# Patient Record
Sex: Male | Born: 1940
Health system: Southern US, Community
[De-identification: ages and names within clinical notes are randomized; demographics above are authoritative.]

## PROBLEM LIST (undated history)

## (undated) ENCOUNTER — Ambulatory Visit (HOSPITAL_COMMUNITY): Discharge status: Absent without leave | Payer: Medicare HMO

## (undated) DIAGNOSIS — M1990 Unspecified osteoarthritis, unspecified site: Secondary | ICD-10-CM

## (undated) DIAGNOSIS — F32A Depression, unspecified: Secondary | ICD-10-CM

## (undated) DIAGNOSIS — I2510 Atherosclerotic heart disease of native coronary artery without angina pectoris: Secondary | ICD-10-CM

## (undated) DIAGNOSIS — K429 Umbilical hernia without obstruction or gangrene: Secondary | ICD-10-CM

## (undated) DIAGNOSIS — R911 Solitary pulmonary nodule: Secondary | ICD-10-CM

## (undated) DIAGNOSIS — M25471 Effusion, right ankle: Secondary | ICD-10-CM

## (undated) DIAGNOSIS — L719 Rosacea, unspecified: Secondary | ICD-10-CM

## (undated) DIAGNOSIS — U071 COVID-19: Secondary | ICD-10-CM

## (undated) DIAGNOSIS — H269 Unspecified cataract: Secondary | ICD-10-CM

## (undated) DIAGNOSIS — K648 Other hemorrhoids: Secondary | ICD-10-CM

## (undated) DIAGNOSIS — E785 Hyperlipidemia, unspecified: Secondary | ICD-10-CM

## (undated) DIAGNOSIS — I10 Essential (primary) hypertension: Secondary | ICD-10-CM

## (undated) DIAGNOSIS — H9190 Unspecified hearing loss, unspecified ear: Secondary | ICD-10-CM

## (undated) DIAGNOSIS — J449 Chronic obstructive pulmonary disease, unspecified: Secondary | ICD-10-CM

## (undated) DIAGNOSIS — K222 Esophageal obstruction: Secondary | ICD-10-CM

## (undated) DIAGNOSIS — D126 Benign neoplasm of colon, unspecified: Secondary | ICD-10-CM

## (undated) DIAGNOSIS — F329 Major depressive disorder, single episode, unspecified: Secondary | ICD-10-CM

## (undated) DIAGNOSIS — K644 Residual hemorrhoidal skin tags: Secondary | ICD-10-CM

## (undated) DIAGNOSIS — K5790 Diverticulosis of intestine, part unspecified, without perforation or abscess without bleeding: Secondary | ICD-10-CM

## (undated) DIAGNOSIS — K449 Diaphragmatic hernia without obstruction or gangrene: Secondary | ICD-10-CM

## (undated) DIAGNOSIS — I739 Peripheral vascular disease, unspecified: Secondary | ICD-10-CM

## (undated) DIAGNOSIS — C801 Malignant (primary) neoplasm, unspecified: Secondary | ICD-10-CM

## (undated) HISTORY — DX: Diverticulosis of intestine, part unspecified, without perforation or abscess without bleeding: K57.90

## (undated) HISTORY — DX: Diaphragmatic hernia without obstruction or gangrene: K44.9

## (undated) HISTORY — DX: Solitary pulmonary nodule: R91.1

## (undated) HISTORY — PX: TONSILLECTOMY: SUR1361

## (undated) HISTORY — DX: Unspecified hearing loss, unspecified ear: H91.90

## (undated) HISTORY — PX: CATARACT EXTRACTION, BILATERAL: SHX1313

## (undated) HISTORY — DX: Atherosclerotic heart disease of native coronary artery without angina pectoris: I25.10

## (undated) HISTORY — DX: Chronic obstructive pulmonary disease, unspecified: J44.9

## (undated) HISTORY — DX: Other hemorrhoids: K64.8

## (undated) HISTORY — DX: Depression, unspecified: F32.A

## (undated) HISTORY — DX: Esophageal obstruction: K22.2

## (undated) HISTORY — DX: Major depressive disorder, single episode, unspecified: F32.9

## (undated) HISTORY — DX: Rosacea, unspecified: L71.9

## (undated) HISTORY — DX: Benign neoplasm of colon, unspecified: D12.6

## (undated) HISTORY — DX: Residual hemorrhoidal skin tags: K64.4

## (undated) HISTORY — DX: Essential (primary) hypertension: I10

## (undated) HISTORY — DX: Hyperlipidemia, unspecified: E78.5

## (undated) HISTORY — DX: Effusion, right ankle: M25.471

## (undated) HISTORY — DX: Umbilical hernia without obstruction or gangrene: K42.9

## (undated) HISTORY — DX: Unspecified osteoarthritis, unspecified site: M19.90

## (undated) HISTORY — DX: Unspecified cataract: H26.9

## (undated) HISTORY — PX: OTHER SURGICAL HISTORY: SHX169

---
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
## 2002-11-20 ENCOUNTER — Emergency Department (HOSPITAL_COMMUNITY): Admission: EM | Admit: 2002-11-20 | Discharge: 2002-11-20 | Payer: Self-pay | Admitting: Emergency Medicine

## 2004-01-19 ENCOUNTER — Encounter: Payer: Self-pay | Admitting: Internal Medicine

## 2007-01-16 ENCOUNTER — Ambulatory Visit: Payer: Self-pay | Admitting: Internal Medicine

## 2007-01-30 ENCOUNTER — Ambulatory Visit: Payer: Self-pay | Admitting: Internal Medicine

## 2007-01-30 HISTORY — PX: COLONOSCOPY W/ BIOPSIES AND POLYPECTOMY: SHX1376

## 2007-10-23 ENCOUNTER — Encounter: Admission: RE | Admit: 2007-10-23 | Discharge: 2007-10-23 | Payer: Self-pay | Admitting: Internal Medicine

## 2009-01-08 ENCOUNTER — Encounter: Payer: Self-pay | Admitting: Internal Medicine

## 2009-01-15 ENCOUNTER — Encounter (INDEPENDENT_AMBULATORY_CARE_PROVIDER_SITE_OTHER): Payer: Self-pay | Admitting: *Deleted

## 2009-02-18 ENCOUNTER — Ambulatory Visit: Payer: Self-pay | Admitting: Internal Medicine

## 2009-02-18 ENCOUNTER — Encounter (INDEPENDENT_AMBULATORY_CARE_PROVIDER_SITE_OTHER): Payer: Self-pay | Admitting: *Deleted

## 2009-02-18 DIAGNOSIS — R1319 Other dysphagia: Secondary | ICD-10-CM | POA: Insufficient documentation

## 2009-02-19 ENCOUNTER — Ambulatory Visit: Payer: Self-pay | Admitting: Internal Medicine

## 2009-02-19 HISTORY — PX: UPPER GASTROINTESTINAL ENDOSCOPY: SHX188

## 2010-01-21 ENCOUNTER — Telehealth (INDEPENDENT_AMBULATORY_CARE_PROVIDER_SITE_OTHER): Payer: Self-pay | Admitting: *Deleted

## 2010-03-19 ENCOUNTER — Ambulatory Visit: Payer: Self-pay | Admitting: Cardiology

## 2010-03-29 ENCOUNTER — Telehealth (INDEPENDENT_AMBULATORY_CARE_PROVIDER_SITE_OTHER): Payer: Self-pay

## 2010-03-30 ENCOUNTER — Encounter: Payer: Self-pay | Admitting: Cardiovascular Disease

## 2010-03-30 ENCOUNTER — Ambulatory Visit: Admission: RE | Admit: 2010-03-30 | Discharge: 2010-03-30 | Payer: Self-pay | Source: Home / Self Care

## 2010-03-30 ENCOUNTER — Encounter (HOSPITAL_COMMUNITY)
Admission: RE | Admit: 2010-03-30 | Discharge: 2010-04-20 | Payer: Self-pay | Source: Home / Self Care | Attending: Cardiology | Admitting: Cardiology

## 2010-04-20 NOTE — Progress Notes (Signed)
  Phone Note Other Incoming   Request: Send information Summary of Call: Request for records received from [facility]. [place] faxed 02/19/09 upper endo, 02/18/09 ov, 01/30/07 colon and 01/19/04 colon to fax number [phone].

## 2010-04-22 NOTE — Progress Notes (Signed)
Summary: Nuc.Pre-Procedure  Phone Note Outgoing Call Call back at [facility] Phone [phone]   Call placed by: Irean Hong, RN,  March 29, 2010 1:50 PM Summary of Call: Reviewed information on Myoview Information Sheet (see scanned document for further details).  Spoke with patient.     Nuclear Med Background Indications for Stress Test: Evaluation for Ischemia     Symptoms: Chest Pain, Palpitations    Nuclear Pre-Procedure Cardiac Risk Factors: History of Smoking, Hypertension, Lipids Height (in): 72

## 2010-04-22 NOTE — Assessment & Plan Note (Signed)
Summary: Cardiology Nuclear Testing  Nuclear Med Background Indications for Stress Test: Evaluation for Ischemia     Symptoms: Chest Pain, Fatigue, Palpitations, Rapid HR  Symptoms Comments: Chest discomfort/soreness   Nuclear Pre-Procedure Cardiac Risk Factors: Carotid Disease, History of Smoking, Hypertension, Lipids Caffeine/Decaff Intake: None NPO After: 8:00 PM Lungs: clear IV 0.9% NS with Angio Cath: 20g     IV Site: R Antecubital IV Started by: Irean Hong, RN Chest Size (in) 40     Height (in): 72 Weight (lb): 197 BMI: 26.81 Tech Comments: Held metoprolol 24 hrs.  Nuclear Med Study 1 or 2 day study:  1 day     Stress Test Type:  Stress Reading MD:  Kristeen Miss, MD     Referring MD:  Villa Herb Resting Radionuclide:  Technetium 37m Tetrofosmin     Resting Radionuclide Dose:  11 mCi  Stress Radionuclide:  Technetium 51m Tetrofosmin     Stress Radionuclide Dose:  33 mCi   Stress Protocol Exercise Time (min):  11:36 min     Max HR:  133 bpm     Predicted Max HR:  149 bpm  Max Systolic BP: 178 mm Hg     Percent Max HR:  89.26 %     METS: 13.7 Rate Pressure Product:  16109    Stress Test Technologist:  Cathlyn Parsons, RN     Nuclear Technologist:  Doyne Keel, CNMT  Rest Procedure  Myocardial perfusion imaging was performed at rest 45 minutes following the intravenous administration of Technetium 32m Tetrofosmin.  Stress Procedure  The patient exercised for 11:36. The patient stopped due to fatigue and moderate SOB. Patient RPE level at peak exercise of 17. Patient denied any chest pain.  There were nonspecific ST-T wave changes.  Technetium 38m Tetrofosmin was injected at peak exercise and myocardial perfusion imaging was performed after a brief delay.  QPS Raw Data Images:  There is interference from nuclear activity from structures below the diaphragm.  This does not affect the ability to read the study. Stress Images:  There is a small defect in  the inferior basal segment with normal uptake in the remaining segments. Rest Images:  There is a small defect in the inferior basal segment with normal uptake in the remaining segments. Subtraction (SDS):  No evidence of ischemia. Transient Ischemic Dilatation:  .89  (Normal <1.22)  Lung/Heart Ratio:  .28  (Normal <0.45)  Quantitative Gated Spect Images QGS EDV:  107 ml QGS ESV:  45 ml QGS EF:  57 % QGS cine images:  The overall LV function is normal.  There is mild hypokinesis of the inferior basal segment.  Findings Low risk nuclear study      Overall Impression  Exercise Capacity: Excellent exercise capacity. BP Response: Normal blood pressure response. Clinical Symptoms: No chest pain ECG Impression: No significant ST segment change suggestive of ischemia. Overall Impression: Low risk stress nuclear study. Overall Impression Comments: There is a small defect in the inferior basal segment that may represent a past Inferior basal MI.  It may be due to bowel uptake of tracer.  There is no evidence of ischemia.  His exercise capacity is excellent.  Appended Document: Cardiology Nuclear Testing copy sent to Dr. Patty Sermons

## 2011-03-04 ENCOUNTER — Encounter: Payer: Self-pay | Admitting: Cardiology

## 2011-03-04 ENCOUNTER — Encounter: Payer: Self-pay | Admitting: *Deleted

## 2011-03-07 ENCOUNTER — Ambulatory Visit (INDEPENDENT_AMBULATORY_CARE_PROVIDER_SITE_OTHER): Payer: Medicare Other | Admitting: Cardiology

## 2011-03-07 ENCOUNTER — Encounter: Payer: Self-pay | Admitting: Cardiology

## 2011-03-07 DIAGNOSIS — E785 Hyperlipidemia, unspecified: Secondary | ICD-10-CM

## 2011-03-07 DIAGNOSIS — I10 Essential (primary) hypertension: Secondary | ICD-10-CM

## 2011-03-07 DIAGNOSIS — R079 Chest pain, unspecified: Secondary | ICD-10-CM | POA: Insufficient documentation

## 2011-03-07 LAB — BASIC METABOLIC PANEL
BUN: 20 mg/dL (ref 6–23)
CO2: 21 mEq/L (ref 19–32)
Calcium: 9.1 mg/dL (ref 8.4–10.5)
Chloride: 108 mEq/L (ref 96–112)
Creatinine, Ser: 1.1 mg/dL (ref 0.4–1.5)
Glucose, Bld: 107 mg/dL — ABNORMAL HIGH (ref 70–99)

## 2011-03-07 NOTE — Assessment & Plan Note (Signed)
Continue present blood pressure medications. 

## 2011-03-07 NOTE — Progress Notes (Signed)
HPI: 70 year old male for evaluation of chest pain. Patient seen by Dr Patty Sermons 12/11 with chest pain. Patient had carotid Dopplers in November of 2011. This showed 1-50% bilateral stenosis. Abdominal ultrasound in November of 2011 showed mild dilatation of the distal abdominal aorta but not considered a true aneurysm. Myoview in January of 2012 showed an ejection fraction of 57%. There was a small fixed defect in the inferior basal wall but no ischemia. Small prior infarct could not be excluded. He has been treated medically. Patient states he developed an "uneasy discomfort" in his chest last fall associated with anxiety and sitting for extended amount of time. The pain does not radiate. It is not paretic or positional. No associated symptoms. It lasts one hour and resolves spontaneously. He was started on an antidepressant with improvement. Several months ago he discontinued that medication and his symptoms recurred. He now notes they increase with exercising. He denies dyspnea on exertion, orthopnea, PND or pedal edema. Because of this chest pain he returns for further evaluation.  Current Outpatient Prescriptions  Medication Sig Dispense Refill  . buPROPion (WELLBUTRIN SR) 150 MG 12 hr tablet Take 150 mg by mouth daily.        . Cholecalciferol (VITAMIN D3) 2000 UNITS capsule Take 2,000 Units by mouth daily.        . Coenzyme Q10 (COQ-10 PO) Take 1 tablet by mouth daily.        Marland Kitchen escitalopram (LEXAPRO) 10 MG tablet Take 10 mg by mouth daily.        . Fish Oil OIL Take 1 tablet by mouth daily.        Marland Kitchen GLUCOSAMINE CHONDROITIN COMPLX PO Take 1 tablet by mouth daily.        Marland Kitchen LORazepam (ATIVAN) 0.5 MG tablet Take 0.5 mg by mouth every 8 (eight) hours as needed.        Marland Kitchen losartan (COZAAR) 50 MG tablet Take 50 mg by mouth daily.        . Multiple Vitamin (MULTIVITAMIN) capsule Take 1 capsule by mouth daily.        . pravastatin (PRAVACHOL) 40 MG tablet Take 40 mg by mouth daily.        Marland Kitchen zolpidem  (AMBIEN) 10 MG tablet Take 10 mg by mouth at bedtime as needed.           Past Medical History  Diagnosis Date  . DYSPHAGIA   . Colon polyp   . Hemorrhoids   . Arthritis   . Chest discomfort   . Bone spur   . COPD (chronic obstructive pulmonary disease)   . HTN (hypertension)   . Decreased hearing   . Rosacea   . Hyperlipemia   . Depression     Past Surgical History  Procedure Date  . Tonsillectomy     History   Social History  . Marital Status: Married    Spouse Name: N/A    Number of Children: N/A  . Years of Education: N/A   Occupational History  . Not on file.   Social History Main Topics  . Smoking status: Former Games developer  . Smokeless tobacco: Not on file  . Alcohol Use: Not on file  . Drug Use: Not on file  . Sexually Active: Not on file   Other Topics Concern  . Not on file   Social History Narrative  . No narrative on file    ROS: no fevers or chills, productive cough, hemoptysis, dysphasia, odynophagia, melena, hematochezia,  dysuria, hematuria, rash, seizure activity, orthopnea, PND, pedal edema, claudication. Remaining systems are negative.  Physical Exam: Well-developed well-nourished, anxious in no acute distress.  Skin is warm and dry.  HEENT is normal.  Neck is supple. No thyromegaly.  Chest is clear to auscultation with normal expansion.  Cardiovascular exam is regular rate and rhythm.  Abdominal exam nontender or distended. No masses palpated. Extremities show no edema. neuro grossly intact  ECG normal sinus rhythm with no ST changes.

## 2011-03-07 NOTE — Assessment & Plan Note (Signed)
Continue statin. 

## 2011-03-07 NOTE — Patient Instructions (Addendum)
Your physician recommends that you schedule a follow-up appointment in: 4-6 WEEKS  Your physician has requested that you have cardiac CT. Cardiac computed tomography (CT) is a painless test that uses an x-ray machine to take clear, detailed pictures of your heart. For further information please visit [email or web address] Please follow instruction sheet as given.   Your physician recommends that you return for lab work in: TODAY    START ASPIRIN 81 MG ONCE DAILY WITH FOOD

## 2011-03-07 NOTE — Assessment & Plan Note (Signed)
Symptoms with both typical and atypical features. He recently describes some discomfort with exertion. However, he also states that his symptoms returned after dcing antidepressant. Patient appears anxious in the office today and wonders whether there is a component of anxiety. I discussed cardiac cath vs CT; he would like conservative approach if possible. Will arrange cardiac CT with low threshold for cath. Add ASA 81 mg daily.

## 2011-03-08 ENCOUNTER — Other Ambulatory Visit: Payer: Self-pay | Admitting: *Deleted

## 2011-03-08 NOTE — Progress Notes (Signed)
Addended by: Freddi Starr on: 03/08/2011 12:43 PM   Modules accepted: Orders

## 2011-03-10 ENCOUNTER — Encounter: Payer: Self-pay | Admitting: *Deleted

## 2011-03-18 NOTE — Progress Notes (Signed)
Addended by: Kem Parkinson on: 03/18/2011 03:29 PM   Modules accepted: Orders

## 2011-03-24 ENCOUNTER — Telehealth: Payer: Self-pay | Admitting: Cardiology

## 2011-03-24 NOTE — Telephone Encounter (Signed)
Spoke with pt, yesterday while bending over to get dressed he suddenly developed a sharpe pain that went through his chest and into his back. It lasted for 20 to 30 sec and then went away. He denies SOB or other symptoms before or after the episode. He then went to the gym and worked out and had no problems. Today he is sore through his chest. He states it felt like a muscle pull and if he tqakes tylenol it helps the soreness. He is scheduled for a cardiac ct 04-06-11. Explained to pt that it sounded non-cardiac but will make dr Jens Som aware.

## 2011-03-24 NOTE — Telephone Encounter (Signed)
Spoke with pt, aware of dr Ludwig Clarks feels is non-cardiac pain.

## 2011-03-24 NOTE — Telephone Encounter (Signed)
New Problem:     Patient called in wanting to speak with you about an episode, or a "flare-up" as he called it, he had yesterday.  He says that outside of a little soreness, he is fine today. Patient says please call back at your earliest conveniens.

## 2011-03-24 NOTE — Telephone Encounter (Signed)
CP sounds muscular Olga Millers

## 2011-03-25 ENCOUNTER — Telehealth: Payer: Self-pay | Admitting: Internal Medicine

## 2011-03-28 ENCOUNTER — Telehealth: Payer: Self-pay | Admitting: Internal Medicine

## 2011-03-28 NOTE — Telephone Encounter (Signed)
Patient is c/o non cardiac chest pain.  He is scheduled for a cardiac CT on 04/06/11.  He will keep this appt on 04/19/11 if they rule out cardiac cause of pain.  He c/o intermittent CP mostly with activity "fast walking".  He is asking if there is anything he can take, I have advised him to try Prilosec OTC.  Patient has a history of esophageal stricture and hiatal hernia.

## 2011-03-28 NOTE — Telephone Encounter (Signed)
See other phone note from 03/25/11

## 2011-03-28 NOTE — Telephone Encounter (Signed)
Left message for patient to call back  

## 2011-03-28 NOTE — Telephone Encounter (Signed)
No answer and no machine I will continue to try and reach the patient. 

## 2011-03-30 ENCOUNTER — Other Ambulatory Visit (HOSPITAL_COMMUNITY): Payer: Medicare Other

## 2011-04-01 ENCOUNTER — Inpatient Hospital Stay (HOSPITAL_COMMUNITY): Admission: RE | Admit: 2011-04-01 | Payer: Medicare Other | Source: Ambulatory Visit

## 2011-04-01 ENCOUNTER — Other Ambulatory Visit (HOSPITAL_COMMUNITY): Payer: Medicare Other

## 2011-04-04 ENCOUNTER — Encounter: Payer: Self-pay | Admitting: Cardiology

## 2011-04-06 ENCOUNTER — Ambulatory Visit (HOSPITAL_COMMUNITY)
Admission: RE | Admit: 2011-04-06 | Discharge: 2011-04-06 | Disposition: A | Discharge status: Home / Self Care | Payer: Medicare Other | Source: Ambulatory Visit | Attending: Cardiology | Admitting: Cardiology

## 2011-04-06 DIAGNOSIS — J984 Other disorders of lung: Secondary | ICD-10-CM | POA: Insufficient documentation

## 2011-04-06 DIAGNOSIS — R072 Precordial pain: Secondary | ICD-10-CM

## 2011-04-06 MED ORDER — IOHEXOL 350 MG/ML SOLN
80.0000 mL | Freq: Once | INTRAVENOUS | Status: AC | PRN
  Administered 2011-04-06: 80 mL via INTRAVENOUS

## 2011-04-07 ENCOUNTER — Telehealth: Payer: Self-pay | Admitting: Cardiology

## 2011-04-07 MED FILL — Metoprolol Tartrate IV Soln 5 MG/5ML: INTRAVENOUS | Qty: 15 | Status: AC

## 2011-04-07 NOTE — Telephone Encounter (Signed)
New Problem  Patient calling for results of test taken yesterday.  Please return call on [phone]

## 2011-04-07 NOTE — Telephone Encounter (Signed)
Spoke with pt, aware of ct results. Follow up appt 04-18-11.

## 2011-04-11 ENCOUNTER — Other Ambulatory Visit: Payer: Medicare Other | Admitting: *Deleted

## 2011-04-18 ENCOUNTER — Encounter: Payer: Self-pay | Admitting: Cardiology

## 2011-04-18 ENCOUNTER — Other Ambulatory Visit: Payer: Self-pay | Admitting: Cardiology

## 2011-04-18 ENCOUNTER — Ambulatory Visit (INDEPENDENT_AMBULATORY_CARE_PROVIDER_SITE_OTHER): Payer: Medicare Other | Admitting: Cardiology

## 2011-04-18 ENCOUNTER — Encounter: Payer: Self-pay | Admitting: *Deleted

## 2011-04-18 DIAGNOSIS — R072 Precordial pain: Secondary | ICD-10-CM

## 2011-04-18 DIAGNOSIS — I10 Essential (primary) hypertension: Secondary | ICD-10-CM

## 2011-04-18 DIAGNOSIS — R079 Chest pain, unspecified: Secondary | ICD-10-CM

## 2011-04-18 DIAGNOSIS — Z01810 Encounter for preprocedural cardiovascular examination: Secondary | ICD-10-CM

## 2011-04-18 DIAGNOSIS — J984 Other disorders of lung: Secondary | ICD-10-CM

## 2011-04-18 DIAGNOSIS — E785 Hyperlipidemia, unspecified: Secondary | ICD-10-CM

## 2011-04-18 DIAGNOSIS — R911 Solitary pulmonary nodule: Secondary | ICD-10-CM

## 2011-04-18 DIAGNOSIS — I2510 Atherosclerotic heart disease of native coronary artery without angina pectoris: Secondary | ICD-10-CM

## 2011-04-18 LAB — CBC WITH DIFFERENTIAL/PLATELET
Basophils Absolute: 0 10*3/uL (ref 0.0–0.1)
HCT: 42.2 % (ref 39.0–52.0)
Lymphocytes Relative: 28.2 % (ref 12.0–46.0)
Lymphs Abs: 1.5 10*3/uL (ref 0.7–4.0)
Monocytes Relative: 11.4 % (ref 3.0–12.0)
Neutrophils Relative %: 57.8 % (ref 43.0–77.0)
Platelets: 237 10*3/uL (ref 150.0–400.0)
RDW: 13.4 % (ref 11.5–14.6)

## 2011-04-18 LAB — BASIC METABOLIC PANEL
BUN: 17 mg/dL (ref 6–23)
Calcium: 9.3 mg/dL (ref 8.4–10.5)
Creatinine, Ser: 1.1 mg/dL (ref 0.4–1.5)
GFR: 72.92 mL/min (ref >60.00)
Glucose, Bld: 92 mg/dL (ref 70–99)

## 2011-04-18 LAB — PROTIME-INR
INR: 1 ratio (ref 0.8–1.0)
Prothrombin Time: 10.8 s (ref 10.2–12.4)

## 2011-04-18 NOTE — Assessment & Plan Note (Signed)
Continue aspirin and statin. 

## 2011-04-18 NOTE — Assessment & Plan Note (Signed)
Blood pressure controlled. Continue present medications. 

## 2011-04-18 NOTE — Patient Instructions (Signed)
Your physician recommends that you schedule a follow-up appointment in: 2-3 WEEKS WITH PA OR NP  Your physician recommends that you return for lab work in: TODAY  Your physician has requested that you have a cardiac catheterization. Cardiac catheterization is used to diagnose and/or treat various heart conditions. Doctors may recommend this procedure for a number of different reasons. The most common reason is to evaluate chest pain. Chest pain can be a symptom of coronary artery disease (CAD), and cardiac catheterization can show whether plaque is narrowing or blocking your heart's arteries. This procedure is also used to evaluate the valves, as well as measure the blood flow and oxygen levels in different parts of your heart. For further information please visit [email or web address] Please follow instruction sheet, as given.

## 2011-04-18 NOTE — Progress Notes (Signed)
 HPI:70-year-old male for FU of CAD. Patient seen by Dr Brackbill 12/11 with chest pain. Patient had carotid Dopplers in November of 2011. This showed 1-50% bilateral stenosis. Abdominal ultrasound in November of 2011 showed mild dilatation of the distal abdominal aorta but not considered a true aneurysm. Myoview in January of 2012 showed an ejection fraction of 57%. There was a small fixed defect in the inferior basal wall but no ischemia. Small prior infarct could not be excluded. When I saw him in December we scheduled a cardiac CT which was performed in January of 2013. This revealed 2 pulmonary nodules in the left lower lobe. Followup recommended in 3-6 months. Calcium score was 194. There was a less then 40% LAD. There was a less then 40% circumflex. RCA he felt to be occluded with distal vessel filling by collaterals. Since he was last seen, he continues to have occasional chest pain. There appears to be too tight. 1 is in the upper chest and neck area. It occurs with walking quickly or using the treadmill. He has another type in the right chest area that occurs at any time and lasts 10 minutes and resolves spontaneously. It is not exertional, pleuritic, positional or related to food.   Current Outpatient Prescriptions  Medication Sig Dispense Refill  . aspirin EC 81 MG tablet Take 1 tablet (81 mg total) by mouth daily.  150 tablet  2  . buPROPion (WELLBUTRIN SR) 150 MG 12 hr tablet Take 150 mg by mouth daily.        . Cholecalciferol (VITAMIN D3) 2000 UNITS capsule Take 2,000 Units by mouth daily.        . Coenzyme Q10 (COQ-10 PO) Take 1 tablet by mouth daily.        . escitalopram (LEXAPRO) 10 MG tablet Take 10 mg by mouth daily.        . Fish Oil OIL Take 1 tablet by mouth daily.        . GLUCOSAMINE CHONDROITIN COMPLX PO Take 1 tablet by mouth as needed.       . LORazepam (ATIVAN) 0.5 MG tablet Take 0.5 mg by mouth every 8 (eight) hours as needed.        . losartan (COZAAR) 50 MG tablet  Take 50 mg by mouth daily.        . Multiple Vitamin (MULTIVITAMIN) capsule Take 1 capsule by mouth daily.        . pravastatin (PRAVACHOL) 40 MG tablet Take 40 mg by mouth daily.        . zolpidem (AMBIEN) 10 MG tablet Take 10 mg by mouth at bedtime as needed.           Past Medical History  Diagnosis Date  . Adenomatous colon polyp   . Internal and external hemorrhoids without complication   . Arthritis   . COPD (chronic obstructive pulmonary disease)   . HTN (hypertension)   . Decreased hearing   . Rosacea   . Hyperlipemia   . Depression   . Diverticulosis   . Esophageal stricture   . Hiatal hernia   . CAD (coronary artery disease)     Past Surgical History  Procedure Date  . Tonsillectomy   . Cataract surgery   . Colonoscopy w/ biopsies and polypectomy 01/30/2007    diverticulosis, internal and external hemorrhoids  . Upper gastrointestinal endoscopy 02/19/2009    stricture, GERD, Hiatal hernia    History   Social History  . Marital Status: Married      Spouse Name: N/A    Number of Children: N/A  . Years of Education: N/A   Occupational History  . Not on file.   Social History Main Topics  . Smoking status: Former Smoker  . Smokeless tobacco: Not on file  . Alcohol Use: Yes  . Drug Use: No  . Sexually Active: Not on file   Other Topics Concern  . Not on file   Social History Narrative  . No narrative on file    ROS: no fevers or chills, productive cough, hemoptysis, dysphasia, odynophagia, melena, hematochezia, dysuria, hematuria, rash, seizure activity, orthopnea, PND, pedal edema, claudication. Remaining systems are negative.  Physical Exam: Well-developed well-nourished in no acute distress.  Skin is warm and dry.  HEENT is normal.  Neck is supple. No thyromegaly.  Chest is clear to auscultation with normal expansion.  Cardiovascular exam is regular rate and rhythm.  Abdominal exam nontender or distended. No masses palpated. Extremities show  no edema. neuro grossly intact       

## 2011-04-18 NOTE — Assessment & Plan Note (Signed)
Continue statin. 

## 2011-04-18 NOTE — Assessment & Plan Note (Signed)
Followup chest CT without contrast in 3 months.

## 2011-04-18 NOTE — Assessment & Plan Note (Signed)
Patient has 2 types of chest pain. He has one that is exertional relieved with rest and more concerning. His cardiac CT suggest an occluded right with lesser disease in the LAD and circumflex. Given continuing symptoms feel cardiac catheterization is indicated. We will proceed as an outpatient. The risks and benefits were discussed and the patient agrees to proceed.

## 2011-04-19 ENCOUNTER — Ambulatory Visit: Payer: Medicare Other | Admitting: Internal Medicine

## 2011-04-20 ENCOUNTER — Encounter (HOSPITAL_BASED_OUTPATIENT_CLINIC_OR_DEPARTMENT_OTHER)
Admission: RE | Disposition: A | Discharge status: Home / Self Care | Payer: Self-pay | Source: Ambulatory Visit | Attending: Cardiovascular Disease

## 2011-04-20 ENCOUNTER — Encounter (HOSPITAL_BASED_OUTPATIENT_CLINIC_OR_DEPARTMENT_OTHER): Payer: Self-pay | Admitting: *Deleted

## 2011-04-20 ENCOUNTER — Inpatient Hospital Stay (HOSPITAL_BASED_OUTPATIENT_CLINIC_OR_DEPARTMENT_OTHER)
Admission: RE | Admit: 2011-04-20 | Discharge: 2011-04-20 | Disposition: A | Discharge status: Home / Self Care | Payer: Medicare Other | Source: Ambulatory Visit | Attending: Cardiovascular Disease | Admitting: Cardiovascular Disease

## 2011-04-20 DIAGNOSIS — I2510 Atherosclerotic heart disease of native coronary artery without angina pectoris: Secondary | ICD-10-CM

## 2011-04-20 DIAGNOSIS — J4489 Other specified chronic obstructive pulmonary disease: Secondary | ICD-10-CM | POA: Insufficient documentation

## 2011-04-20 DIAGNOSIS — I700 Atherosclerosis of aorta: Secondary | ICD-10-CM

## 2011-04-20 DIAGNOSIS — R072 Precordial pain: Secondary | ICD-10-CM

## 2011-04-20 DIAGNOSIS — I10 Essential (primary) hypertension: Secondary | ICD-10-CM | POA: Insufficient documentation

## 2011-04-20 DIAGNOSIS — J449 Chronic obstructive pulmonary disease, unspecified: Secondary | ICD-10-CM | POA: Insufficient documentation

## 2011-04-20 SURGERY — JV LEFT HEART CATHETERIZATION WITH CORONARY ANGIOGRAM
Anesthesia: Moderate Sedation

## 2011-04-20 MED ORDER — SODIUM CHLORIDE 0.9 % IJ SOLN: 3.0000 mL | INTRAMUSCULAR | Status: DC | PRN

## 2011-04-20 MED ORDER — SODIUM CHLORIDE 0.9 % IJ SOLN: 3.0000 mL | Freq: Two times a day (BID) | INTRAMUSCULAR | Status: DC

## 2011-04-20 MED ORDER — DIAZEPAM 5 MG PO TABS
5.0000 mg | ORAL_TABLET | ORAL | Status: AC
  Administered 2011-04-20: 5 mg via ORAL

## 2011-04-20 MED ORDER — ACETAMINOPHEN 325 MG PO TABS: 650.0000 mg | ORAL_TABLET | ORAL | Status: DC | PRN

## 2011-04-20 MED ORDER — ASPIRIN 81 MG PO CHEW
324.0000 mg | CHEWABLE_TABLET | ORAL | Status: AC
  Administered 2011-04-20: 243 mg via ORAL

## 2011-04-20 MED ORDER — SODIUM CHLORIDE 0.9 % IV SOLN
INTRAVENOUS | Status: AC
Start: 2011-04-20 — End: 2011-04-20

## 2011-04-20 MED ORDER — METOPROLOL SUCCINATE ER 25 MG PO TB24: 25.0000 mg | ORAL_TABLET | Freq: Every day | ORAL | Status: DC

## 2011-04-20 MED ORDER — SODIUM CHLORIDE 0.9 % IV SOLN
1.0000 mL/kg/h | INTRAVENOUS | Status: DC
  Administered 2011-04-20: 0.835 mL/kg/h via INTRAVENOUS

## 2011-04-20 MED ORDER — ISOSORBIDE MONONITRATE ER 30 MG PO TB24: 30.0000 mg | ORAL_TABLET | Freq: Every day | ORAL | Status: DC

## 2011-04-20 MED ORDER — SODIUM CHLORIDE 0.9 % IV SOLN: 250.0000 mL | INTRAVENOUS | Status: DC | PRN

## 2011-04-20 NOTE — OR Nursing (Signed)
Dr Arida at bedside to discuss results and treatment plan with pt and family 

## 2011-04-20 NOTE — Op Note (Signed)
Cardiac Catheterization Procedure Note  Name: Manuel Friedman MRN: [id number] DOB: 11/21/1940  Procedure: Left Heart Cath, Selective Coronary Angiography, LV angiography, abdominal aortogram.  Indication: This is a 71 year old male was evaluated recently for exertional chest pain. He had a CTA of coronary arteries which showed mild disease in the left circumflex and LAD. The RCA was noted to be occluded with left-to-right collaterals. Due to his continued symptoms and CTA findings, he was referred for cardiac catheterization and possible PCI.  Due to tortuosity in the distal aorta and iliac artery, I also decided to perform an abdominal angiogram at the end of the case.   Medications:  Sedation:  1 mg IV Versed, 25 mcg IV Fentanyl   Procedural details: The right groin was prepped, draped, and anesthetized with 1% lidocaine. Using modified Seldinger technique, a 4 French sheath was introduced into the right femoral artery. Standard Judkins catheters were used for coronary angiography and left ventriculography. The JL4 did not fit and thus at the L5 was used Catheter exchanges were performed over a guidewire. There were no immediate procedural complications. The patient was transferred to the post catheterization recovery area for further monitoring.   Procedural Findings:  Hemodynamics: AO:  126/65   mmHg LV:  128/8    mmHg LVEDP: 19  mmHg  Coronary angiography: Coronary dominance: Right   Left Main:  The vessel is normal in size and short. It's free of any significant disease.  Left Anterior Descending (LAD):  The vessel is normal in size with mild calcifications proximally. There is a 30% discrete stenosis proximally just before the first septal perforator and after the first diagonal. The rest of the LAD has minor irregularities without obstructive disease. The distal LAD and septal branches give collaterals to the right coronary artery which it goes all the way back to the mid  segment.  1st diagonal (D1):  Normal in size with mild 20% proximal disease.  2nd diagonal (D2):  Small in size and free of significant disease.  3rd diagonal (D3):  Very small in size.  Circumflex (LCx):  Normal in size and nondominant. There is mild atherosclerosis proximally without evidence of obstructive disease.  1st obtuse marginal:  Very small in size and free of significant disease.  2nd obtuse marginal:  Medium in size with minor irregularities.  3rd obtuse marginal:  Large size branch that bifurcates into 2 medium size vessels. There is a 20% proximal disease in the lower branch.  The AV groove as minor irregularities.   Right Coronary Artery: The vessel is occluded proximally. The length of the occlusion seems to be around 10 mm. There is some minimal antegrade flow. The vessel gives collaterals from the left side.   Left ventriculography: Left ventricular systolic function is normal , LVEF is estimated at 55 %, there is no significant mitral regurgitation  Abdominal aortogram: There is tortuosity in the distal aorta and iliac artery without evidence of aortic aneurysm.  Final Conclusions:   1. Significant 1 vessel coronary artery disease with a chronic total occlusion of the proximal right coronary artery with reasonable collaterals from the left side. The occlusion seems to be short and overall appears to be favorable for PCI to an antegrade approach. There is mild nonobstructive disease in the left anterior descending artery and left circumflex. 2. Normal LV systolic function with mildly elevated left ventricular end-diastolic pressure. 3. Tortuous distal aorta and iliac arteries without evidence of aneurysm or obstructive disease.  Recommendations: The patient has mild  angina with what seems to be a chronic total occlusion of the right coronary artery which gets reasonable collaterals from the left side. The CTO has favorable features for PCI success. I discussed the case  with Dr. Jens Som. The plan is to maximize his antianginal therapy and reevaluate his symptoms. If he continues to be symptomatic in spite of medical therapy, would proceed with an attempted PCI of the RCA.  Lorine Bears MD, [facility] 04/20/2011, 8:34 AM

## 2011-04-20 NOTE — Interval H&P Note (Signed)
History and Physical Interval Note:  04/20/2011 7:37 AM  Manuel Friedman  has presented today for surgery, with the diagnosis of cp  The various methods of treatment have been discussed with the patient. After consideration of risks, benefits and other options for treatment, the patient has consented to  Procedure(s): JV LEFT HEART CATHETERIZATION WITH CORONARY ANGIOGRAM as a surgical intervention .  The patients' history has been reviewed, patient examined, no change in status, stable for surgery.  I have reviewed the patients' chart and labs.  Questions were answered to the patient's satisfaction.     Lorine Bears

## 2011-04-20 NOTE — H&P (View-Only) (Signed)
HPI:71 year old male for FU of CAD. Patient seen by Dr Patty Sermons 12/11 with chest pain. Patient had carotid Dopplers in November of 2011. This showed 1-50% bilateral stenosis. Abdominal ultrasound in November of 2011 showed mild dilatation of the distal abdominal aorta but not considered a true aneurysm. Myoview in January of 2012 showed an ejection fraction of 57%. There was a small fixed defect in the inferior basal wall but no ischemia. Small prior infarct could not be excluded. When I saw him in December we scheduled a cardiac CT which was performed in January of 2013. This revealed 2 pulmonary nodules in the left lower lobe. Followup recommended in 3-6 months. Calcium score was 194. There was a less then 40% LAD. There was a less then 40% circumflex. RCA he felt to be occluded with distal vessel filling by collaterals. Since he was last seen, he continues to have occasional chest pain. There appears to be too tight. 1 is in the upper chest and neck area. It occurs with walking quickly or using the treadmill. He has another type in the right chest area that occurs at any time and lasts 10 minutes and resolves spontaneously. It is not exertional, pleuritic, positional or related to food.   Current Outpatient Prescriptions  Medication Sig Dispense Refill  . aspirin EC 81 MG tablet Take 1 tablet (81 mg total) by mouth daily.  150 tablet  2  . buPROPion (WELLBUTRIN SR) 150 MG 12 hr tablet Take 150 mg by mouth daily.        . Cholecalciferol (VITAMIN D3) 2000 UNITS capsule Take 2,000 Units by mouth daily.        . Coenzyme Q10 (COQ-10 PO) Take 1 tablet by mouth daily.        Marland Kitchen escitalopram (LEXAPRO) 10 MG tablet Take 10 mg by mouth daily.        . Fish Oil OIL Take 1 tablet by mouth daily.        Marland Kitchen GLUCOSAMINE CHONDROITIN COMPLX PO Take 1 tablet by mouth as needed.       Marland Kitchen LORazepam (ATIVAN) 0.5 MG tablet Take 0.5 mg by mouth every 8 (eight) hours as needed.        Marland Kitchen losartan (COZAAR) 50 MG tablet  Take 50 mg by mouth daily.        . Multiple Vitamin (MULTIVITAMIN) capsule Take 1 capsule by mouth daily.        . pravastatin (PRAVACHOL) 40 MG tablet Take 40 mg by mouth daily.        Marland Kitchen zolpidem (AMBIEN) 10 MG tablet Take 10 mg by mouth at bedtime as needed.           Past Medical History  Diagnosis Date  . Adenomatous colon polyp   . Internal and external hemorrhoids without complication   . Arthritis   . COPD (chronic obstructive pulmonary disease)   . HTN (hypertension)   . Decreased hearing   . Rosacea   . Hyperlipemia   . Depression   . Diverticulosis   . Esophageal stricture   . Hiatal hernia   . CAD (coronary artery disease)     Past Surgical History  Procedure Date  . Tonsillectomy   . Cataract surgery   . Colonoscopy w/ biopsies and polypectomy 01/30/2007    diverticulosis, internal and external hemorrhoids  . Upper gastrointestinal endoscopy 02/19/2009    stricture, GERD, Hiatal hernia    History   Social History  . Marital Status: Married  Spouse Name: N/A    Number of Children: N/A  . Years of Education: N/A   Occupational History  . Not on file.   Social History Main Topics  . Smoking status: Former Games developer  . Smokeless tobacco: Not on file  . Alcohol Use: Yes  . Drug Use: No  . Sexually Active: Not on file   Other Topics Concern  . Not on file   Social History Narrative  . No narrative on file    ROS: no fevers or chills, productive cough, hemoptysis, dysphasia, odynophagia, melena, hematochezia, dysuria, hematuria, rash, seizure activity, orthopnea, PND, pedal edema, claudication. Remaining systems are negative.  Physical Exam: Well-developed well-nourished in no acute distress.  Skin is warm and dry.  HEENT is normal.  Neck is supple. No thyromegaly.  Chest is clear to auscultation with normal expansion.  Cardiovascular exam is regular rate and rhythm.  Abdominal exam nontender or distended. No masses palpated. Extremities show  no edema. neuro grossly intact

## 2011-04-20 NOTE — OR Nursing (Signed)
Discharge instructions reviewed and signed, pt stated understanding, ambulated in hall without difficulty, site level 0, transported to wife's car via wheelchair 

## 2011-04-20 NOTE — OR Nursing (Signed)
Tegaderm dressing applied, site level 0, bedrest begins at 0840 

## 2011-05-04 ENCOUNTER — Encounter: Payer: Self-pay | Admitting: Nurse Practitioner

## 2011-05-04 ENCOUNTER — Ambulatory Visit (INDEPENDENT_AMBULATORY_CARE_PROVIDER_SITE_OTHER): Payer: Medicare Other | Admitting: Nurse Practitioner

## 2011-05-04 VITALS — BP 110/78 | HR 68 | Ht 72.5 in | Wt 197.6 lb

## 2011-05-04 DIAGNOSIS — R911 Solitary pulmonary nodule: Secondary | ICD-10-CM

## 2011-05-04 DIAGNOSIS — I2510 Atherosclerotic heart disease of native coronary artery without angina pectoris: Secondary | ICD-10-CM

## 2011-05-04 MED ORDER — ISOSORBIDE MONONITRATE ER 60 MG PO TB24: 60.0000 mg | ORAL_TABLET | ORAL | Status: DC

## 2011-05-04 NOTE — Patient Instructions (Signed)
We are going to increase the Imdur to 60 mg each morning. You may take 2 of your 30 mg tablets and use those up. There is a prescription for the 60 mg tablets at the [facility].  I will see you in a month.  Call the [facility] office at [phone] if you have any questions, problems or concerns.

## 2011-05-04 NOTE — Progress Notes (Signed)
Manuel Friedman Date of Birth: 1940/08/05 Medical Record #[id number]  History of Present Illness: "Manuel Friedman" is seen back today for a post cath visit. He is seen for Dr. Jens Som. He has had exertional angina over the last several months. Has had recent cardiac cath. Has a totally occluded RCA with left to right collaterals and otherwise has mild nonobstructive disease in the LAD and LCX. He is managed medically but it is felt that if his symptoms are not improved with medicines, then attempts at PCI could be carried out.  He comes in today. He is doing well. He is quite talkative. He is feeling better. He would say that he is 75% better. He is back exercising and still notes a little discomfort in his chest. He also has some discomfort at rest that he feels may just be arthritis. He is tolerating his medicines. No headache.   Current Outpatient Prescriptions on File Prior to Visit  Medication Sig Dispense Refill  . aspirin EC 81 MG tablet Take 1 tablet (81 mg total) by mouth daily.  150 tablet  2  . buPROPion (WELLBUTRIN SR) 150 MG 12 hr tablet Take 150 mg by mouth daily.        . Cholecalciferol (VITAMIN D3) 2000 UNITS capsule Take 2,000 Units by mouth daily.        . Coenzyme Q10 (COQ-10 PO) Take 1 tablet by mouth daily.        Marland Kitchen escitalopram (LEXAPRO) 10 MG tablet Take 10 mg by mouth daily.        . Fish Oil OIL Take 1 tablet by mouth daily.        Marland Kitchen LORazepam (ATIVAN) 0.5 MG tablet Take 0.5 mg by mouth every 8 (eight) hours as needed.        Marland Kitchen losartan (COZAAR) 50 MG tablet Take 50 mg by mouth daily.        . metoprolol succinate (TOPROL XL) 25 MG 24 hr tablet Take 1 tablet (25 mg total) by mouth daily.  30 tablet  3  . Multiple Vitamin (MULTIVITAMIN) capsule Take 1 capsule by mouth daily.        . pravastatin (PRAVACHOL) 40 MG tablet Take 40 mg by mouth daily.        Marland Kitchen zolpidem (AMBIEN) 10 MG tablet Take 10 mg by mouth at bedtime as needed.         Current Facility-Administered  Medications on File Prior to Visit  Medication Dose Route Frequency Provider Last Rate Last Dose  . 0.9 %  sodium chloride infusion  250 mL Intravenous PRN Lewayne Bunting, MD      . sodium chloride 0.9 % injection 3 mL  3 mL Intravenous Q12H Lewayne Bunting, MD      . sodium chloride 0.9 % injection 3 mL  3 mL Intravenous PRN Lewayne Bunting, MD        No Known Allergies  Past Medical History  Diagnosis Date  . Adenomatous colon polyp   . Internal and external hemorrhoids without complication   . Arthritis   . COPD (chronic obstructive pulmonary disease)   . HTN (hypertension)   . Decreased hearing   . Rosacea   . Hyperlipemia   . Depression   . Diverticulosis   . Esophageal stricture   . Hiatal hernia   . CAD (coronary artery disease)     s/p cath 03/2011 with total RCA, left to right collaterals and mild nonobstructive disease in the LAD  and LCX. Trying medical management but could attempt PCI of the RCA  . Lung nodule     Noted on CTA Jan 2013. Needs follow upstudy    Past Surgical History  Procedure Date  . Tonsillectomy   . Cataract surgery   . Colonoscopy w/ biopsies and polypectomy 01/30/2007    diverticulosis, internal and external hemorrhoids  . Upper gastrointestinal endoscopy 02/19/2009    stricture, GERD, Hiatal hernia    History  Smoking status  . Former Smoker  Smokeless tobacco  . Not on file    History  Alcohol Use  . Yes    Family History  Problem Relation Age of Onset  . Pancreatic cancer Mother   . Multiple myeloma Father     Review of Systems: The review of systems is positive for arthritis.  All other systems were reviewed and are negative.  Physical Exam: BP 110/78  Pulse 68  Ht 6' 0.5" (1.842 m)  Wt 197 lb 9.6 oz (89.631 kg)  BMI 26.43 kg/m2 Patient is very pleasant and in no acute distress. Skin is warm and dry. Color is normal.  HEENT is unremarkable. Normocephalic/atraumatic. PERRL. Sclera are nonicteric. Neck is supple. No  masses. No JVD. Lungs are clear. Cardiac exam shows a regular rate and rhythm. Abdomen is soft. Extremities are without edema. Gait and ROM are intact. No gross neurologic deficits noted.  LABORATORY DATA:   Assessment / Plan:

## 2011-05-04 NOTE — Assessment & Plan Note (Signed)
He has had recent cath. No problems post procedure. Groin is ok. He will be managed medically. He reports feeling 75% better. Still with some symptoms with exercise. We will increase the Imdur to 60 mg daily. I will see him back in about 3 weeks. Patient is agreeable to this plan and will call if any problems develop in the interim.

## 2011-05-04 NOTE — Assessment & Plan Note (Signed)
He is aware of his CT report and knows that he needs a repeat study in 3 months.

## 2011-05-25 ENCOUNTER — Encounter: Payer: Self-pay | Admitting: Nurse Practitioner

## 2011-05-25 ENCOUNTER — Ambulatory Visit (INDEPENDENT_AMBULATORY_CARE_PROVIDER_SITE_OTHER): Payer: Medicare Other | Admitting: Nurse Practitioner

## 2011-05-25 VITALS — BP 124/72 | HR 72 | Ht 72.5 in | Wt 200.0 lb

## 2011-05-25 DIAGNOSIS — I2510 Atherosclerotic heart disease of native coronary artery without angina pectoris: Secondary | ICD-10-CM

## 2011-05-25 DIAGNOSIS — R911 Solitary pulmonary nodule: Secondary | ICD-10-CM

## 2011-05-25 NOTE — Progress Notes (Signed)
Manuel Friedman Date of Birth: Nov 05, 1940 Medical Record #[id number]  History of Present Illness: "Manuel Friedman" is seen back today for his 3 week check. He is seen for Dr. Jens Som. He has had exertional angina and had recent cardiac cath. He has a totally occluded RCA with left to right collaterals and otherwise has mild nonobstructive disease in the LAD and LCX. He is managed medically. It is felt that if his symptoms are not controlled with medicines, then attempt at PCI could be carried out. At his last visit, he reported feeling 75% better. Imdur was increased. He has had a lung nodule noted during this work up and will be needing a follow up noncontrast CT later this month.   He comes in today. He is here alone. He is doing very well. He says that he was probably only about 50 to 60% improved at his last visit but now feels 90% better. He remains active. He is going to the gym. He has very little chest discomfort. He is able to exercise without limitation. He is quite happy with how he is doing at this time.  Current Outpatient Prescriptions on File Prior to Visit  Medication Sig Dispense Refill  . aspirin EC 81 MG tablet Take 1 tablet (81 mg total) by mouth daily.  150 tablet  2  . buPROPion (WELLBUTRIN SR) 150 MG 12 hr tablet Take 150 mg by mouth daily.        . Cholecalciferol (VITAMIN D3) 2000 UNITS capsule Take 2,000 Units by mouth daily.        . Coenzyme Q10 (COQ-10 PO) Take 1 tablet by mouth daily.        Marland Kitchen escitalopram (LEXAPRO) 10 MG tablet Take 10 mg by mouth daily.        . Fish Oil OIL Take 1 tablet by mouth daily.        . isosorbide mononitrate (IMDUR) 60 MG 24 hr tablet Take 1 tablet (60 mg total) by mouth every morning.  30 tablet  11  . LORazepam (ATIVAN) 0.5 MG tablet Take 0.5 mg by mouth every 8 (eight) hours as needed.        Marland Kitchen losartan (COZAAR) 50 MG tablet Take 50 mg by mouth daily.        . metoprolol succinate (TOPROL XL) 25 MG 24 hr tablet Take 1 tablet (25 mg total)  by mouth daily.  30 tablet  3  . Multiple Vitamin (MULTIVITAMIN) capsule Take 1 capsule by mouth daily.        . pravastatin (PRAVACHOL) 40 MG tablet Take 40 mg by mouth daily.        Marland Kitchen zolpidem (AMBIEN) 10 MG tablet Take 10 mg by mouth at bedtime as needed.         Current Facility-Administered Medications on File Prior to Visit  Medication Dose Route Frequency Provider Last Rate Last Dose  . 0.9 %  sodium chloride infusion  250 mL Intravenous PRN Lewayne Bunting, MD      . sodium chloride 0.9 % injection 3 mL  3 mL Intravenous Q12H Lewayne Bunting, MD      . sodium chloride 0.9 % injection 3 mL  3 mL Intravenous PRN Lewayne Bunting, MD        No Known Allergies  Past Medical History  Diagnosis Date  . Adenomatous colon polyp   . Internal and external hemorrhoids without complication   . Arthritis   . COPD (chronic obstructive pulmonary  disease)   . HTN (hypertension)   . Decreased hearing   . Rosacea   . Hyperlipemia   . Depression   . Diverticulosis   . Esophageal stricture   . Hiatal hernia   . CAD (coronary artery disease)     s/p cath 03/2011 with total RCA, left to right collaterals and mild nonobstructive disease in the LAD and LCX. Trying medical management but could attempt PCI of the RCA  . Lung nodule     Noted on CTA Jan 2013. Needs follow upstudy    Past Surgical History  Procedure Date  . Tonsillectomy   . Cataract surgery   . Colonoscopy w/ biopsies and polypectomy 01/30/2007    diverticulosis, internal and external hemorrhoids  . Upper gastrointestinal endoscopy 02/19/2009    stricture, GERD, Hiatal hernia    History  Smoking status  . Former Smoker  Smokeless tobacco  . Not on file    History  Alcohol Use  . Yes    Family History  Problem Relation Age of Onset  . Pancreatic cancer Mother   . Multiple myeloma Father     Review of Systems: The review of systems is per the HPI.  Has noted an increase in the number of BM's he has. No  diarrhea. No shortness of breath. All other systems were reviewed and are negative.  Physical Exam: BP 124/72  Pulse 72  Ht 6' 0.5" (1.842 m)  Wt 200 lb (90.719 kg)  BMI 26.75 kg/m2 Patient is very pleasant and in no acute distress. Skin is warm and dry. Color is normal.  HEENT is unremarkable. Normocephalic/atraumatic. PERRL. Sclera are nonicteric. Neck is supple. No masses. No JVD. Lungs are clear. Cardiac exam shows a regular rate and rhythm. Abdomen is soft. Extremities are without edema. Gait and ROM are intact. No gross neurologic deficits noted.  LABORATORY DATA:   Assessment / Plan:

## 2011-05-25 NOTE — Assessment & Plan Note (Signed)
Will arrange his 3 month noncontrast CT study.

## 2011-05-25 NOTE — Assessment & Plan Note (Signed)
He is doing well with medical therapy. He is able to exercise regularly without any real issues. He is happy with his current status. We will continue with his current medicines and will see him back in 4 months. Patient is agreeable to this plan and will call if any problems develop in the interim.

## 2011-05-25 NOTE — Patient Instructions (Signed)
Stay active.  Stay on your current medicines.  We will arrange for a follow up CT later this month regarding the lung nodule.  We will see you back in 4 months.  Call the [facility] office at [phone] if you have any questions, problems or concerns.

## 2011-06-09 ENCOUNTER — Ambulatory Visit (INDEPENDENT_AMBULATORY_CARE_PROVIDER_SITE_OTHER)
Admission: RE | Admit: 2011-06-09 | Discharge: 2011-06-09 | Disposition: A | Discharge status: Home / Self Care | Payer: Medicare Other | Source: Ambulatory Visit | Attending: Nurse Practitioner | Admitting: Nurse Practitioner

## 2011-06-09 DIAGNOSIS — R911 Solitary pulmonary nodule: Secondary | ICD-10-CM

## 2011-07-07 ENCOUNTER — Telehealth: Payer: Self-pay | Admitting: Cardiology

## 2011-07-07 MED ORDER — ISOSORBIDE MONONITRATE ER 60 MG PO TB24: 60.0000 mg | ORAL_TABLET | ORAL | Status: DC

## 2011-07-07 NOTE — Telephone Encounter (Signed)
New Problem:     Patient called in needing a refill of his isosorbide mononitrate (IMDUR) 60 MG 24 hr tablet.

## 2011-08-14 ENCOUNTER — Other Ambulatory Visit: Payer: Self-pay | Admitting: Physician Assistant

## 2011-08-17 ENCOUNTER — Other Ambulatory Visit: Payer: Self-pay | Admitting: Cardiology

## 2011-08-17 MED ORDER — METOPROLOL SUCCINATE ER 25 MG PO TB24: 25.0000 mg | ORAL_TABLET | Freq: Every day | ORAL | Status: DC

## 2011-08-17 MED ORDER — ISOSORBIDE MONONITRATE ER 60 MG PO TB24: 60.0000 mg | ORAL_TABLET | ORAL | Status: DC

## 2011-09-26 ENCOUNTER — Ambulatory Visit (INDEPENDENT_AMBULATORY_CARE_PROVIDER_SITE_OTHER): Payer: Medicare Other | Admitting: Cardiology

## 2011-09-26 ENCOUNTER — Encounter: Payer: Self-pay | Admitting: Cardiology

## 2011-09-26 VITALS — BP 114/70 | HR 62 | Ht 72.0 in | Wt 198.0 lb

## 2011-09-26 DIAGNOSIS — I679 Cerebrovascular disease, unspecified: Secondary | ICD-10-CM

## 2011-09-26 DIAGNOSIS — I10 Essential (primary) hypertension: Secondary | ICD-10-CM

## 2011-09-26 DIAGNOSIS — I2510 Atherosclerotic heart disease of native coronary artery without angina pectoris: Secondary | ICD-10-CM

## 2011-09-26 DIAGNOSIS — R911 Solitary pulmonary nodule: Secondary | ICD-10-CM

## 2011-09-26 DIAGNOSIS — E785 Hyperlipidemia, unspecified: Secondary | ICD-10-CM

## 2011-09-26 NOTE — Progress Notes (Signed)
HPI: Pleasant male for FU of CAD. Patient had carotid Dopplers in November of 2011. This showed 1-50% bilateral stenosis. Abdominal ultrasound in November of 2011 showed mild dilatation of the distal abdominal aorta but not considered a true aneurysm. Cardiac CT performed in January of 2013 revealed 2 pulmonary nodules in the left lower lobe. (Followup performed March 2013 and showed no change with fu recommended in 9-12 months). There was a less then 40% LAD. There was a less then 40% circumflex. RCA he felt to be occluded with distal vessel filling by collaterals. Cardiac catheterization in January of 2013 showed a normal left main. There was a 30% proximal LAD. There was a 20% first diagonal. There was a 20% proximal third obtuse marginal. The right coronary artery was occluded. Ejection fraction was 55%. There was no abdominal aortic aneurysm. Plan was medical therapy. His symptoms persisted despite medicines then PCI of the right coronary artery could be considered. Since I last saw him, the patient denies any dyspnea on exertion, orthopnea, PND, pedal edema, palpitations, syncope or chest pain.   Current Outpatient Prescriptions  Medication Sig Dispense Refill  . aspirin EC 81 MG tablet Take 1 tablet (81 mg total) by mouth daily.  150 tablet  2  . buPROPion (WELLBUTRIN SR) 150 MG 12 hr tablet Take 150 mg by mouth daily.        . cetirizine (ZYRTEC) 10 MG tablet Take 10 mg by mouth as needed.      . Cholecalciferol (VITAMIN D3) 2000 UNITS capsule Take 2,000 Units by mouth daily.        . Coenzyme Q10 (COQ-10 PO) Take 1 tablet by mouth daily.        Marland Kitchen doxycycline (DORYX) 100 MG DR capsule Take 100 mg by mouth daily.      Marland Kitchen escitalopram (LEXAPRO) 10 MG tablet Take 10 mg by mouth daily.        . Fish Oil OIL Take 1 tablet by mouth daily.        . isosorbide mononitrate (IMDUR) 60 MG 24 hr tablet Take 1 tablet (60 mg total) by mouth every morning.  30 tablet  11  . LORazepam (ATIVAN) 0.5 MG tablet  Take 0.5 mg by mouth every 8 (eight) hours as needed.        Marland Kitchen losartan (COZAAR) 50 MG tablet Take 50 mg by mouth daily.        . metoprolol succinate (TOPROL-XL) 25 MG 24 hr tablet Take 1 tablet (25 mg total) by mouth daily.  30 tablet  3  . Multiple Vitamin (MULTIVITAMIN) capsule Take 1 capsule by mouth daily.        . pravastatin (PRAVACHOL) 40 MG tablet Take 40 mg by mouth daily.        Marland Kitchen zolpidem (AMBIEN) 10 MG tablet Take 10 mg by mouth at bedtime as needed.         No current facility-administered medications for this visit.   Facility-Administered Medications Ordered in Other Visits  Medication Dose Route Frequency Provider Last Rate Last Dose  . 0.9 %  sodium chloride infusion  250 mL Intravenous PRN Lewayne Bunting, MD      . sodium chloride 0.9 % injection 3 mL  3 mL Intravenous Q12H Lewayne Bunting, MD      . sodium chloride 0.9 % injection 3 mL  3 mL Intravenous PRN Lewayne Bunting, MD         Past Medical History  Diagnosis  Date  . Adenomatous colon polyp   . Internal and external hemorrhoids without complication   . Arthritis   . COPD (chronic obstructive pulmonary disease)   . HTN (hypertension)   . Decreased hearing   . Rosacea   . Hyperlipemia   . Depression   . Diverticulosis   . Esophageal stricture   . Hiatal hernia   . CAD (coronary artery disease)     s/p cath 03/2011 with total RCA, left to right collaterals and mild nonobstructive disease in the LAD and LCX. Trying medical management but could attempt PCI of the RCA  . Lung nodule     Noted on CTA Jan 2013. Needs follow upstudy    Past Surgical History  Procedure Date  . Tonsillectomy   . Cataract surgery   . Colonoscopy w/ biopsies and polypectomy 01/30/2007    diverticulosis, internal and external hemorrhoids  . Upper gastrointestinal endoscopy 02/19/2009    stricture, GERD, Hiatal hernia    History   Social History  . Marital Status: Married    Spouse Name: N/A    Number of Children:  N/A  . Years of Education: N/A   Occupational History  . Not on file.   Social History Main Topics  . Smoking status: Former Games developer  . Smokeless tobacco: Not on file  . Alcohol Use: Yes  . Drug Use: No  . Sexually Active: Not on file   Other Topics Concern  . Not on file   Social History Narrative  . No narrative on file    ROS: no fevers or chills, productive cough, hemoptysis, dysphasia, odynophagia, melena, hematochezia, dysuria, hematuria, rash, seizure activity, orthopnea, PND, pedal edema, claudication. Remaining systems are negative.  Physical Exam: Well-developed well-nourished in no acute distress.  Skin is warm and dry.  HEENT is normal.  Neck is supple.  Chest is clear to auscultation with normal expansion.  Cardiovascular exam is regular rate and rhythm.  Abdominal exam nontender or distended. No masses palpated. Extremities show no edema. neuro grossly intact  ECG- sinus rhythm at a rate of 62. No ST changes.

## 2011-09-26 NOTE — Patient Instructions (Signed)
Your physician wants you to follow-up in: ONE YEAR WITH DR Shelda Pal will receive a reminder letter in the mail two months in advance. If you don't receive a letter, please call our office to schedule the follow-up appointment.   Your physician has requested that you have a carotid duplex. This test is an ultrasound of the carotid arteries in your neck. It looks at blood flow through these arteries that supply the brain with blood. Allow one hour for this exam. There are no restrictions or special instructions.   CT IN December=WILL CALL YOU

## 2011-09-26 NOTE — Assessment & Plan Note (Signed)
Patient is exercising routinely and having no symptoms. Therefore plan medical therapy. Continue aspirin, beta blocker and statin. We could consider PCI of his right coronary artery in the future if he develops symptoms.

## 2011-09-26 NOTE — Assessment & Plan Note (Signed)
Plan followup chest CT December 2013.

## 2011-09-26 NOTE — Assessment & Plan Note (Signed)
Continue statin. 

## 2011-09-26 NOTE — Assessment & Plan Note (Signed)
Continue aspirin and statin. Schedule followup carotid Dopplers. 

## 2011-09-26 NOTE — Assessment & Plan Note (Signed)
Blood pressure controlled. Continue present medications. 

## 2011-10-10 ENCOUNTER — Encounter (INDEPENDENT_AMBULATORY_CARE_PROVIDER_SITE_OTHER): Payer: Medicare Other

## 2011-10-10 DIAGNOSIS — I6529 Occlusion and stenosis of unspecified carotid artery: Secondary | ICD-10-CM

## 2011-10-10 DIAGNOSIS — R42 Dizziness and giddiness: Secondary | ICD-10-CM

## 2011-10-10 DIAGNOSIS — I679 Cerebrovascular disease, unspecified: Secondary | ICD-10-CM

## 2011-10-14 ENCOUNTER — Telehealth: Payer: Self-pay | Admitting: Cardiology

## 2011-10-14 NOTE — Telephone Encounter (Signed)
No note

## 2011-12-13 ENCOUNTER — Other Ambulatory Visit: Payer: Self-pay | Admitting: Cardiology

## 2012-01-19 ENCOUNTER — Encounter: Payer: Self-pay | Admitting: Internal Medicine

## 2012-02-21 ENCOUNTER — Encounter: Payer: Self-pay | Admitting: Internal Medicine

## 2012-02-29 ENCOUNTER — Ambulatory Visit (AMBULATORY_SURGERY_CENTER): Payer: Medicare Other | Admitting: *Deleted

## 2012-02-29 VITALS — Ht 77.0 in | Wt 203.4 lb

## 2012-02-29 DIAGNOSIS — Z1211 Encounter for screening for malignant neoplasm of colon: Secondary | ICD-10-CM

## 2012-02-29 MED ORDER — NA SULFATE-K SULFATE-MG SULF 17.5-3.13-1.6 GM/177ML PO SOLN: ORAL | Status: DC

## 2012-03-09 ENCOUNTER — Encounter: Payer: Medicare Other | Admitting: Internal Medicine

## 2012-03-26 ENCOUNTER — Telehealth: Payer: Self-pay | Admitting: Internal Medicine

## 2012-03-26 NOTE — Telephone Encounter (Signed)
Pt had rescheduled and needed to verify times of prep. Reviewed with patient and answered questions.  Pt verbalized understanding.

## 2012-03-27 ENCOUNTER — Ambulatory Visit (AMBULATORY_SURGERY_CENTER): Payer: Medicare Other | Admitting: Internal Medicine

## 2012-03-27 ENCOUNTER — Encounter: Payer: Self-pay | Admitting: Internal Medicine

## 2012-03-27 VITALS — BP 130/77 | HR 61 | Temp 96.9°F | Resp 25 | Ht 77.0 in | Wt 203.0 lb

## 2012-03-27 DIAGNOSIS — Z1211 Encounter for screening for malignant neoplasm of colon: Secondary | ICD-10-CM

## 2012-03-27 DIAGNOSIS — D126 Benign neoplasm of colon, unspecified: Secondary | ICD-10-CM

## 2012-03-27 DIAGNOSIS — K5730 Diverticulosis of large intestine without perforation or abscess without bleeding: Secondary | ICD-10-CM

## 2012-03-27 DIAGNOSIS — Z860100 Personal history of colon polyps, unspecified: Secondary | ICD-10-CM | POA: Insufficient documentation

## 2012-03-27 MED ORDER — SODIUM CHLORIDE 0.9 % IV SOLN: 500.0000 mL | INTRAVENOUS | Status: DC

## 2012-03-27 NOTE — Progress Notes (Signed)
Called to room to assist during endoscopic procedure.  Patient ID and intended procedure confirmed with present staff. Received instructions for my participation in the procedure from the performing physician. [email or web address] 

## 2012-03-27 NOTE — Patient Instructions (Addendum)
Two small polyps were removed, they look benign. I will let you know about the results and recommendations by mail. You also have diverticulosis - not usually a problem for people, a handout will explain.  Thank you for choosing me and [facility].  Iva Boop, MD, FACG  YOU HAD AN ENDOSCOPIC PROCEDURE TODAY AT THE [facility]: Refer to the procedure report that was given to you for any specific questions about what was found during the examination.  If the procedure report does not answer your questions, please call your gastroenterologist to clarify.  If you requested that your care partner not be given the details of your procedure findings, then the procedure report has been included in a sealed envelope for you to review at your convenience later.  YOU SHOULD EXPECT: Some feelings of bloating in the abdomen. Passage of more gas than usual.  Walking can help get rid of the air that was put into your GI tract during the procedure and reduce the bloating. If you had a lower endoscopy (such as a colonoscopy or flexible sigmoidoscopy) you may notice spotting of blood in your stool or on the toilet paper. If you underwent a bowel prep for your procedure, then you may not have a normal bowel movement for a few days.  DIET: Your first meal following the procedure should be a light meal and then it is ok to progress to your normal diet.  A half-sandwich or bowl of soup is an example of a good first meal.  Heavy or fried foods are harder to digest and may make you feel nauseous or bloated.  Likewise meals heavy in dairy and vegetables can cause extra gas to form and this can also increase the bloating.  Drink plenty of fluids but you should avoid alcoholic beverages for 24 hours.  ACTIVITY: Your care partner should take you home directly after the procedure.  You should plan to take it easy, moving slowly for the rest of the day.  You can resume normal activity the day after  the procedure however you should NOT DRIVE or use heavy machinery for 24 hours (because of the sedation medicines used during the test).    SYMPTOMS TO REPORT IMMEDIATELY: A gastroenterologist can be reached at any hour.  During normal business hours, 8:30 AM to 5:00 PM Monday through Friday, call [phone].  After hours and on weekends, please call the GI answering service at [phone] who will take a message and have the physician on call contact you.   Following lower endoscopy (colonoscopy or flexible sigmoidoscopy):  Excessive amounts of blood in the stool  Significant tenderness or worsening of abdominal pains  Swelling of the abdomen that is new, acute  Fever of 100F or higher  Following upper endoscopy (EGD)  Vomiting of blood or coffee ground material  New chest pain or pain under the shoulder blades  Painful or persistently difficult swallowing  New shortness of breath  Fever of 100F or higher  Black, tarry-looking stools  FOLLOW UP: If any biopsies were taken you will be contacted by phone or by letter within the next 1-3 weeks.  Call your gastroenterologist if you have not heard about the biopsies in 3 weeks.  Our staff will call the home number listed on your records the next business day following your procedure to check on you and address any questions or concerns that you may have at that time regarding the information given to you following  your procedure. This is a courtesy call and so if there is no answer at the home number and we have not heard from you through the emergency physician on call, we will assume that you have returned to your regular daily activities without incident.  SIGNATURES/CONFIDENTIALITY: You and/or your care partner have signed paperwork which will be entered into your electronic medical record.  These signatures attest to the fact that that the information above on your After Visit Summary has been reviewed and is understood.  Full  responsibility of the confidentiality of this discharge information lies with you and/or your care-partner.

## 2012-03-27 NOTE — Op Note (Signed)
[facility] [address] [place], [address]   COLONOSCOPY PROCEDURE REPORT  PATIENT: Manuel Friedman, Manuel Friedman  MR#: [id number] BIRTHDATE: 01-16-1941 , 71  yrs. old GENDER: Male ENDOSCOPIST: Iva Boop, MD, [facility] PROCEDURE DATE:  03/27/2012 PROCEDURE:   Colonoscopy with biopsy and snare polypectomy ASA CLASS:   Class III INDICATIONS:Screening and surveillance,personal history of colonic polyps and Patient's personal history of adenomatous colon polyps.  MEDICATIONS: propofol (Diprivan) 200mg  IV, MAC sedation, administered by CRNA, and These medications were titrated to patient response per physician's verbal order  DESCRIPTION OF PROCEDURE:   After the risks benefits and alternatives of the procedure were thoroughly explained, informed consent was obtained.  A digital rectal exam revealed no abnormalities of the rectum and A digital rectal exam revealed the prostate was not enlarged.   The LB [id number]  endoscope was introduced through the anus and advanced to the cecum, which was identified by both the appendix and ileocecal valve. No adverse events experienced.   The quality of the prep was Suprep excellent The instrument was then slowly withdrawn as the colon was fully examined.      COLON FINDINGS: Two smooth sessile polyps measuring 3-6 mm in size were found at the cecum and in the rectum.  A polypectomy was performed with cold forceps and with a cold snare.  The resection was complete and the polyp tissue was completely retrieved. Severe diverticulosis was noted in the sigmoid colon.   The colon mucosa was otherwise normal.   A right colon retroflexion was performed.  Retroflexed views revealed no abnormalities. The time to cecum=3 minutes 02 seconds.  Withdrawal time=11 minutes 34 seconds.  The scope was withdrawn and the procedure completed. COMPLICATIONS: There were no complications.  ENDOSCOPIC IMPRESSION: 1.   Two sessile polyps  measuring 3-6 mm in size were found at the cecum and in the rectum; polypectomy was performed with cold forceps and with a cold snare 2.   Severe diverticulosis was noted in the sigmoid colon 3.   The colon mucosa was otherwise normal w/ excellent prep. Personal hx adenomas 2005  RECOMMENDATIONS: Timing of repeat colonoscopy will be determined by pathology findings.   eSigned:  Iva Boop, MD, [facility] 03/27/2012 9:17 AM   cc: Rodrigo Ran, MD and The Patient

## 2012-03-27 NOTE — Progress Notes (Signed)
Patient did not experience any of the following events: a burn prior to discharge; a fall within the facility; wrong site/side/patient/procedure/implant event; or a hospital transfer or hospital admission upon discharge from the facility. (G8907) Patient did not have preoperative order for IV antibiotic SSI prophylaxis. (G8918)  

## 2012-03-28 ENCOUNTER — Telehealth: Payer: Self-pay

## 2012-03-28 NOTE — Telephone Encounter (Signed)
  Follow up Call-  Call back number 03/27/2012  Post procedure Call Back phone  # [phone]  Permission to leave phone message Yes     Patient questions:  Do you have a fever, pain , or abdominal swelling? no Pain Score  0 *  Have you tolerated food without any problems? yes  Have you been able to return to your normal activities? yes  Do you have any questions about your discharge instructions: Diet   no Medications  no Follow up visit  no  Do you have questions or concerns about your Care? no  Actions: * If pain score is 4 or above: No action needed, pain <4.

## 2012-04-03 ENCOUNTER — Encounter: Payer: Self-pay | Admitting: Internal Medicine

## 2012-04-03 NOTE — Progress Notes (Signed)
Quick Note:  6 mm adenoma, 3 mm hyperplastic Repeat colonoscopy 5 years 03/2017 ______

## 2012-04-20 ENCOUNTER — Other Ambulatory Visit: Payer: Self-pay | Admitting: Cardiology

## 2012-07-03 ENCOUNTER — Other Ambulatory Visit: Payer: Self-pay | Admitting: Internal Medicine

## 2012-07-03 DIAGNOSIS — J984 Other disorders of lung: Secondary | ICD-10-CM

## 2012-07-04 ENCOUNTER — Ambulatory Visit
Admission: RE | Admit: 2012-07-04 | Discharge: 2012-07-04 | Disposition: A | Discharge status: Home / Self Care | Payer: Medicare Other | Source: Ambulatory Visit | Attending: Internal Medicine | Admitting: Internal Medicine

## 2012-07-04 DIAGNOSIS — J984 Other disorders of lung: Secondary | ICD-10-CM

## 2012-08-13 ENCOUNTER — Other Ambulatory Visit: Payer: Self-pay | Admitting: Cardiology

## 2012-08-19 ENCOUNTER — Other Ambulatory Visit: Payer: Self-pay | Admitting: Cardiology

## 2012-10-10 ENCOUNTER — Encounter (INDEPENDENT_AMBULATORY_CARE_PROVIDER_SITE_OTHER): Payer: Medicare Other

## 2012-10-10 ENCOUNTER — Ambulatory Visit (INDEPENDENT_AMBULATORY_CARE_PROVIDER_SITE_OTHER): Payer: Medicare Other | Admitting: Cardiology

## 2012-10-10 ENCOUNTER — Encounter: Payer: Self-pay | Admitting: Cardiology

## 2012-10-10 VITALS — BP 110/70 | HR 65 | Wt 204.0 lb

## 2012-10-10 DIAGNOSIS — R911 Solitary pulmonary nodule: Secondary | ICD-10-CM

## 2012-10-10 DIAGNOSIS — I679 Cerebrovascular disease, unspecified: Secondary | ICD-10-CM

## 2012-10-10 DIAGNOSIS — I6529 Occlusion and stenosis of unspecified carotid artery: Secondary | ICD-10-CM

## 2012-10-10 DIAGNOSIS — I10 Essential (primary) hypertension: Secondary | ICD-10-CM

## 2012-10-10 DIAGNOSIS — I2510 Atherosclerotic heart disease of native coronary artery without angina pectoris: Secondary | ICD-10-CM

## 2012-10-10 DIAGNOSIS — E785 Hyperlipidemia, unspecified: Secondary | ICD-10-CM

## 2012-10-10 NOTE — Assessment & Plan Note (Signed)
Continue aspirin and statin. 

## 2012-10-10 NOTE — Assessment & Plan Note (Signed)
Continue aspirin and statin. Followup carotid Dopplers today. 

## 2012-10-10 NOTE — Patient Instructions (Addendum)
Your physician wants you to follow-up in: ONE YEAR WITH DR CRENSHAW You will receive a reminder letter in the mail two months in advance. If you don't receive a letter, please call our office to schedule the follow-up appointment.  

## 2012-10-10 NOTE — Progress Notes (Signed)
HPI: Pleasant male for FU of CAD. Patient had carotid Dopplers in November of 2011. This showed 1-50% bilateral stenosis. Abdominal ultrasound in November of 2011 showed mild dilatation of the distal abdominal aorta but not considered a true aneurysm. Cardiac CT performed in January of 2013 revealed 2 pulmonary nodules in the left lower lobe. (Followup performed April 2014 and showed no change with fu recommended in 12 months). There was a less then 40% LAD. There was a less then 40% circumflex. RCA felt to be occluded with distal vessel filling by collaterals. Cardiac catheterization in January of 2013 showed a normal left main. There was a 30% proximal LAD. There was a 20% first diagonal. There was a 20% proximal third obtuse marginal. The right coronary artery was occluded. Ejection fraction was 55%. There was no abdominal aortic aneurysm. Plan was medical therapy. If his symptoms persisted despite medicines then PCI of the right coronary artery could be considered. Since I last saw him in July of 2013, the patient denies any dyspnea on exertion, orthopnea, PND, pedal edema, palpitations, syncope or chest pain.   Current Outpatient Prescriptions  Medication Sig Dispense Refill  . aspirin EC 81 MG tablet Take 1 tablet (81 mg total) by mouth daily.  150 tablet  2  . buPROPion (WELLBUTRIN SR) 150 MG 12 hr tablet Take 150 mg by mouth daily.        . cetirizine (ZYRTEC) 10 MG tablet Take 10 mg by mouth as needed.      . Cholecalciferol (VITAMIN D3) 2000 UNITS capsule Take 2,000 Units by mouth daily.        Marland Kitchen CINNAMON PO Take 2 tablets by mouth daily.       . Coenzyme Q10 (COQ-10 PO) Take 1 tablet by mouth daily.        Marland Kitchen escitalopram (LEXAPRO) 10 MG tablet Take 10 mg by mouth as needed.       . isosorbide mononitrate (IMDUR) 60 MG 24 hr tablet TAKE 1 TABLET BY MOUTH EVERY MORNING.  30 tablet  11  . LORazepam (ATIVAN) 0.5 MG tablet Take 0.5 mg by mouth every 8 (eight) hours as needed.        Marland Kitchen  losartan (COZAAR) 50 MG tablet Take 25 mg by mouth daily.       . metoprolol succinate (TOPROL-XL) 25 MG 24 hr tablet Take 1 tablet (25 mg total) by mouth daily.  30 tablet  3  . metoprolol succinate (TOPROL-XL) 25 MG 24 hr tablet TAKE 1 TABLET BY MOUTH EVERY DAY  30 tablet  3  . Misc Natural Products (OSTEO BI-FLEX JOINT SHIELD PO) Take by mouth 2 (two) times daily.      . pravastatin (PRAVACHOL) 40 MG tablet Take 40 mg by mouth daily.        Marland Kitchen zolpidem (AMBIEN) 10 MG tablet Take 10 mg by mouth at bedtime as needed.         No current facility-administered medications for this visit.   Facility-Administered Medications Ordered in Other Visits  Medication Dose Route Frequency Provider Last Rate Last Dose  . 0.9 %  sodium chloride infusion  250 mL Intravenous PRN Lewayne Bunting, MD      . sodium chloride 0.9 % injection 3 mL  3 mL Intravenous Q12H Lewayne Bunting, MD      . sodium chloride 0.9 % injection 3 mL  3 mL Intravenous PRN Lewayne Bunting, MD  Past Medical History  Diagnosis Date  . Adenomatous colon polyp   . Internal and external hemorrhoids without complication   . Arthritis   . COPD (chronic obstructive pulmonary disease)   . HTN (hypertension)   . Decreased hearing   . Rosacea   . Hyperlipemia   . Depression   . Diverticulosis   . Esophageal stricture   . Hiatal hernia   . CAD (coronary artery disease)     s/p cath 03/2011 with total RCA, left to right collaterals and mild nonobstructive disease in the LAD and LCX. Trying medical management but could attempt PCI of the RCA  . Lung nodule     Noted on CTA Jan 2013. Needs follow upstudy    Past Surgical History  Procedure Laterality Date  . Tonsillectomy    . Cataract surgery    . Colonoscopy w/ biopsies and polypectomy  01/30/2007    diverticulosis, internal and external hemorrhoids  . Upper gastrointestinal endoscopy  02/19/2009    stricture, GERD, Hiatal hernia    History   Social History  .  Marital Status: Married    Spouse Name: N/A    Number of Children: N/A  . Years of Education: N/A   Occupational History  . Not on file.   Social History Main Topics  . Smoking status: Former Smoker    Quit date: 02/29/1992  . Smokeless tobacco: Never Used  . Alcohol Use: 3.5 oz/week    7 drink(s) per week  . Drug Use: No  . Sexually Active: Not on file   Other Topics Concern  . Not on file   Social History Narrative  . No narrative on file    ROS: no fevers or chills, productive cough, hemoptysis, dysphasia, odynophagia, melena, hematochezia, dysuria, hematuria, rash, seizure activity, orthopnea, PND, pedal edema, claudication. Remaining systems are negative.  Physical Exam: Well-developed well-nourished in no acute distress.  Skin is warm and dry.  HEENT is normal.  Neck is supple.  Chest is clear to auscultation with normal expansion.  Cardiovascular exam is regular rate and rhythm.  Abdominal exam nontender or distended. No masses palpated. Extremities show no edema. neuro grossly intact  ECG sinus rhythm at a rate of 65. No ST changes.

## 2012-10-10 NOTE — Assessment & Plan Note (Signed)
Blood pressure controlled. Continue present medications. 

## 2012-10-10 NOTE — Assessment & Plan Note (Signed)
followup chest CT April 2015.

## 2012-10-10 NOTE — Assessment & Plan Note (Signed)
Continue statin. Lipids and liver monitored by primary care. 

## 2012-12-26 ENCOUNTER — Other Ambulatory Visit: Payer: Self-pay | Admitting: Cardiology

## 2013-02-27 ENCOUNTER — Other Ambulatory Visit: Payer: Self-pay | Admitting: Dermatology

## 2013-04-08 ENCOUNTER — Other Ambulatory Visit: Payer: Self-pay

## 2013-04-08 MED ORDER — METOPROLOL SUCCINATE ER 25 MG PO TB24: ORAL_TABLET | ORAL | Status: DC

## 2013-04-08 MED ORDER — ISOSORBIDE MONONITRATE ER 60 MG PO TB24: ORAL_TABLET | ORAL | Status: DC

## 2013-04-22 ENCOUNTER — Other Ambulatory Visit: Payer: Self-pay | Admitting: *Deleted

## 2013-04-22 MED ORDER — METOPROLOL SUCCINATE ER 25 MG PO TB24: ORAL_TABLET | ORAL | Status: DC

## 2013-04-22 MED ORDER — ISOSORBIDE MONONITRATE ER 60 MG PO TB24: ORAL_TABLET | ORAL | Status: DC

## 2013-07-05 ENCOUNTER — Encounter: Payer: Self-pay | Admitting: Cardiology

## 2013-07-08 ENCOUNTER — Other Ambulatory Visit: Payer: Self-pay | Admitting: *Deleted

## 2013-07-08 DIAGNOSIS — R911 Solitary pulmonary nodule: Secondary | ICD-10-CM

## 2013-07-15 ENCOUNTER — Ambulatory Visit (INDEPENDENT_AMBULATORY_CARE_PROVIDER_SITE_OTHER)
Admission: RE | Admit: 2013-07-15 | Discharge: 2013-07-15 | Disposition: A | Discharge status: Home / Self Care | Payer: Commercial Managed Care - HMO | Source: Ambulatory Visit | Attending: Cardiology | Admitting: Cardiology

## 2013-07-15 DIAGNOSIS — R911 Solitary pulmonary nodule: Secondary | ICD-10-CM

## 2013-10-27 ENCOUNTER — Other Ambulatory Visit: Payer: Self-pay | Admitting: Cardiology

## 2013-10-29 ENCOUNTER — Other Ambulatory Visit: Payer: Self-pay

## 2013-10-29 MED ORDER — ISOSORBIDE MONONITRATE ER 60 MG PO TB24: ORAL_TABLET | ORAL | Status: DC

## 2014-01-22 ENCOUNTER — Other Ambulatory Visit: Payer: Self-pay | Admitting: Dermatology

## 2014-01-23 ENCOUNTER — Other Ambulatory Visit: Payer: Self-pay

## 2014-01-23 MED ORDER — ISOSORBIDE MONONITRATE ER 60 MG PO TB24: ORAL_TABLET | ORAL | Status: DC

## 2014-01-23 MED ORDER — METOPROLOL SUCCINATE ER 25 MG PO TB24: ORAL_TABLET | ORAL | Status: DC

## 2014-02-18 ENCOUNTER — Other Ambulatory Visit: Payer: Self-pay | Admitting: Orthopedic Surgery

## 2014-02-25 ENCOUNTER — Other Ambulatory Visit: Payer: Self-pay | Admitting: Dermatology

## 2014-04-14 DIAGNOSIS — M75112 Incomplete rotator cuff tear or rupture of left shoulder, not specified as traumatic: Secondary | ICD-10-CM | POA: Diagnosis not present

## 2014-04-14 DIAGNOSIS — Z4789 Encounter for other orthopedic aftercare: Secondary | ICD-10-CM | POA: Diagnosis not present

## 2014-04-18 ENCOUNTER — Other Ambulatory Visit: Payer: Self-pay | Admitting: Cardiology

## 2014-04-24 ENCOUNTER — Telehealth: Payer: Self-pay | Admitting: Cardiology

## 2014-04-24 NOTE — Telephone Encounter (Signed)
Unable to reach pt or leave a message  He does need a follow up appoinmrnt, he has not been seen since 2014.

## 2014-04-24 NOTE — Telephone Encounter (Signed)
Pt called in wanting to speak with Hilda Blades about a slight problem with him Metoprolol and Isosorbide medications. He stated that he received a 30 day supply when he normally gets a 90 day. He would like to know if he needs to be seen in the office to get this straightened out. Please call  Thanks

## 2014-04-30 ENCOUNTER — Other Ambulatory Visit: Payer: Self-pay | Admitting: Cardiology

## 2014-04-30 ENCOUNTER — Telehealth: Payer: Self-pay | Admitting: Cardiology

## 2014-04-30 NOTE — Telephone Encounter (Signed)
Follow up scheduled

## 2014-05-01 NOTE — Telephone Encounter (Signed)
Close encounter 

## 2014-06-12 DIAGNOSIS — R0683 Snoring: Secondary | ICD-10-CM | POA: Diagnosis not present

## 2014-06-12 DIAGNOSIS — I2510 Atherosclerotic heart disease of native coronary artery without angina pectoris: Secondary | ICD-10-CM | POA: Diagnosis not present

## 2014-06-12 DIAGNOSIS — I739 Peripheral vascular disease, unspecified: Secondary | ICD-10-CM | POA: Diagnosis not present

## 2014-06-12 DIAGNOSIS — I10 Essential (primary) hypertension: Secondary | ICD-10-CM | POA: Diagnosis not present

## 2014-06-12 DIAGNOSIS — J302 Other seasonal allergic rhinitis: Secondary | ICD-10-CM | POA: Diagnosis not present

## 2014-06-12 DIAGNOSIS — Z6829 Body mass index (BMI) 29.0-29.9, adult: Secondary | ICD-10-CM | POA: Diagnosis not present

## 2014-06-12 DIAGNOSIS — R5383 Other fatigue: Secondary | ICD-10-CM | POA: Diagnosis not present

## 2014-06-12 DIAGNOSIS — E291 Testicular hypofunction: Secondary | ICD-10-CM | POA: Diagnosis not present

## 2014-06-18 DIAGNOSIS — I739 Peripheral vascular disease, unspecified: Secondary | ICD-10-CM | POA: Diagnosis not present

## 2014-07-02 DIAGNOSIS — E538 Deficiency of other specified B group vitamins: Secondary | ICD-10-CM | POA: Diagnosis not present

## 2014-07-02 NOTE — Progress Notes (Signed)
HPI: FU CAD. Abdominal ultrasound in November of 2011 showed mild dilatation of the distal abdominal aorta but not considered a true aneurysm. Cardiac CT performed in January of 2013 revealed less than 40% LAD. There was a less then 40% circumflex. RCA felt to be occluded with distal vessel filling by collaterals. Cardiac catheterization in January of 2013 showed a normal left main. There was a 30% proximal LAD. There was a 20% first diagonal. There was a 20% proximal third obtuse marginal. The right coronary artery was occluded. Ejection fraction was 55%. There was no abdominal aortic aneurysm. Plan was medical therapy. If his symptoms persisted despite medicines then PCI of the right coronary artery could be considered. Carotid Dopplers July 2014 showed 1-39% bilateral stenosis and follow-up recommended 2 years. Since I last saw him, the patient denies any dyspnea on exertion, orthopnea, PND, pedal edema, palpitations, syncope or chest pain.   Current Outpatient Prescriptions  Medication Sig Dispense Refill  . aspirin EC 81 MG tablet Take 1 tablet (81 mg total) by mouth daily. 150 tablet 2  . cetirizine (ZYRTEC) 10 MG tablet Take 10 mg by mouth as needed.    . Cholecalciferol (VITAMIN D3) 2000 UNITS capsule Take 2,000 Units by mouth daily.      Marland Kitchen CINNAMON PO Take 2 tablets by mouth daily.     . Coenzyme Q10 (COQ-10 PO) Take 1 tablet by mouth daily.      Marland Kitchen escitalopram (LEXAPRO) 10 MG tablet Take 10 mg by mouth as needed.     . isosorbide mononitrate (IMDUR) 60 MG 24 hr tablet Take 1 tablet (60 mg total) by mouth daily. 90 tablet 0  . LORazepam (ATIVAN) 0.5 MG tablet Take 0.5 mg by mouth every 8 (eight) hours as needed.      Marland Kitchen losartan (COZAAR) 50 MG tablet Take 25 mg by mouth daily.     . Misc Natural Products (OSTEO BI-FLEX JOINT SHIELD PO) Take by mouth 2 (two) times daily.    . pravastatin (PRAVACHOL) 40 MG tablet Take 40 mg by mouth daily.      Marland Kitchen zolpidem (AMBIEN) 10 MG tablet Take 10  mg by mouth at bedtime as needed.      Marland Kitchen SALINE NASAL SPRAY NA Place 1 spray into the nose as needed.     No current facility-administered medications for this visit.   Facility-Administered Medications Ordered in Other Visits  Medication Dose Route Frequency Provider Last Rate Last Dose  . 0.9 %  sodium chloride infusion  250 mL Intravenous PRN Lelon Perla, MD      . sodium chloride 0.9 % injection 3 mL  3 mL Intravenous Q12H Lelon Perla, MD      . sodium chloride 0.9 % injection 3 mL  3 mL Intravenous PRN Lelon Perla, MD         Past Medical History  Diagnosis Date  . Adenomatous colon polyp   . Internal and external hemorrhoids without complication   . Arthritis   . COPD (chronic obstructive pulmonary disease)   . HTN (hypertension)   . Decreased hearing   . Rosacea   . Hyperlipemia   . Depression   . Diverticulosis   . Esophageal stricture   . Hiatal hernia   . CAD (coronary artery disease)     s/p cath 03/2011 with total RCA, left to right collaterals and mild nonobstructive disease in the LAD and LCX. Trying medical management but could attempt  PCI of the RCA  . Lung nodule     Noted on CTA Jan 2013. Needs follow upstudy    Past Surgical History  Procedure Laterality Date  . Tonsillectomy    . Cataract surgery    . Colonoscopy w/ biopsies and polypectomy  01/30/2007    diverticulosis, internal and external hemorrhoids  . Upper gastrointestinal endoscopy  02/19/2009    stricture, GERD, Hiatal hernia    History   Social History  . Marital Status: Married    Spouse Name: N/A  . Number of Children: N/A  . Years of Education: N/A   Occupational History  . Not on file.   Social History Main Topics  . Smoking status: Former Smoker    Quit date: 02/29/1992  . Smokeless tobacco: Never Used  . Alcohol Use: 3.5 oz/week    7 drink(s) per week  . Drug Use: No  . Sexual Activity: Not on file   Other Topics Concern  . Not on file   Social History  Narrative    ROS: fatigue but no fevers or chills, productive cough, hemoptysis, dysphasia, odynophagia, melena, hematochezia, dysuria, hematuria, rash, seizure activity, orthopnea, PND, pedal edema, claudication. Remaining systems are negative.  Physical Exam: Well-developed well-nourished in no acute distress.  Skin is warm and dry.  HEENT is normal.  Neck is supple.  Chest is clear to auscultation with normal expansion.  Cardiovascular exam is regular rate and rhythm.  Abdominal exam nontender or distended. No masses palpated. Extremities show no edema. neuro grossly intact  ECG sinus tachycardia with PACs.

## 2014-07-04 ENCOUNTER — Ambulatory Visit (INDEPENDENT_AMBULATORY_CARE_PROVIDER_SITE_OTHER): Payer: Commercial Managed Care - HMO | Admitting: Cardiology

## 2014-07-04 ENCOUNTER — Encounter: Payer: Self-pay | Admitting: Cardiology

## 2014-07-04 ENCOUNTER — Encounter: Payer: Self-pay | Admitting: *Deleted

## 2014-07-04 VITALS — BP 120/60 | HR 96 | Ht 77.0 in | Wt 210.0 lb

## 2014-07-04 DIAGNOSIS — I2510 Atherosclerotic heart disease of native coronary artery without angina pectoris: Secondary | ICD-10-CM | POA: Diagnosis not present

## 2014-07-04 DIAGNOSIS — I679 Cerebrovascular disease, unspecified: Secondary | ICD-10-CM

## 2014-07-04 DIAGNOSIS — I2583 Coronary atherosclerosis due to lipid rich plaque: Principal | ICD-10-CM

## 2014-07-04 DIAGNOSIS — I714 Abdominal aortic aneurysm, without rupture, unspecified: Secondary | ICD-10-CM | POA: Insufficient documentation

## 2014-07-04 NOTE — Assessment & Plan Note (Signed)
Continue statin. Lipids and liver monitored by primary care. 

## 2014-07-04 NOTE — Assessment & Plan Note (Signed)
Previous ultrasound showed minimal dilatation. Repeat ultrasound now.

## 2014-07-04 NOTE — Patient Instructions (Signed)
Your physician wants you to follow-up in: [place] will receive a reminder letter in the mail two months in advance. If you don't receive a letter, please call our office to schedule the follow-up appointment.   Your physician has requested that you have a carotid duplex. This test is an ultrasound of the carotid arteries in your neck. It looks at blood flow through these arteries that supply the brain with blood. Allow one hour for this exam. There are no restrictions or special instructions.DUE IN July  Your physician has requested that you have an abdominal aorta duplex. During this test, an ultrasound is used to evaluate the aorta. Allow 30 minutes for this exam. Do not eat after midnight the day before and avoid carbonated beverages DUE IN [employer]

## 2014-07-04 NOTE — Assessment & Plan Note (Signed)
Continue aspirin and statin. Schedule follow-up carotid Dopplers July 2016.

## 2014-07-04 NOTE — Assessment & Plan Note (Signed)
Continue aspirin and statin. 

## 2014-07-04 NOTE — Assessment & Plan Note (Signed)
Blood pressure controlled. Continue present medications. Potassium and renal function monitored by primary care. 

## 2014-07-07 ENCOUNTER — Telehealth (HOSPITAL_COMMUNITY): Payer: Self-pay | Admitting: *Deleted

## 2014-07-09 ENCOUNTER — Telehealth (HOSPITAL_COMMUNITY): Payer: Self-pay | Admitting: *Deleted

## 2014-07-15 DIAGNOSIS — E785 Hyperlipidemia, unspecified: Secondary | ICD-10-CM | POA: Diagnosis not present

## 2014-07-15 DIAGNOSIS — Z125 Encounter for screening for malignant neoplasm of prostate: Secondary | ICD-10-CM | POA: Diagnosis not present

## 2014-07-15 DIAGNOSIS — I2510 Atherosclerotic heart disease of native coronary artery without angina pectoris: Secondary | ICD-10-CM | POA: Diagnosis not present

## 2014-07-15 DIAGNOSIS — R7301 Impaired fasting glucose: Secondary | ICD-10-CM | POA: Diagnosis not present

## 2014-07-15 DIAGNOSIS — I10 Essential (primary) hypertension: Secondary | ICD-10-CM | POA: Diagnosis not present

## 2014-07-22 DIAGNOSIS — I679 Cerebrovascular disease, unspecified: Secondary | ICD-10-CM | POA: Diagnosis not present

## 2014-07-22 DIAGNOSIS — E538 Deficiency of other specified B group vitamins: Secondary | ICD-10-CM | POA: Diagnosis not present

## 2014-07-22 DIAGNOSIS — R0683 Snoring: Secondary | ICD-10-CM | POA: Diagnosis not present

## 2014-07-22 DIAGNOSIS — I2510 Atherosclerotic heart disease of native coronary artery without angina pectoris: Secondary | ICD-10-CM | POA: Diagnosis not present

## 2014-07-22 DIAGNOSIS — E291 Testicular hypofunction: Secondary | ICD-10-CM | POA: Diagnosis not present

## 2014-07-22 DIAGNOSIS — I739 Peripheral vascular disease, unspecified: Secondary | ICD-10-CM | POA: Diagnosis not present

## 2014-07-22 DIAGNOSIS — Z13820 Encounter for screening for osteoporosis: Secondary | ICD-10-CM | POA: Diagnosis not present

## 2014-07-22 DIAGNOSIS — Z0000 Encounter for general adult medical examination without abnormal findings: Secondary | ICD-10-CM | POA: Diagnosis not present

## 2014-07-22 DIAGNOSIS — R5383 Other fatigue: Secondary | ICD-10-CM | POA: Diagnosis not present

## 2014-08-12 DIAGNOSIS — E538 Deficiency of other specified B group vitamins: Secondary | ICD-10-CM | POA: Diagnosis not present

## 2014-08-25 ENCOUNTER — Other Ambulatory Visit: Payer: Self-pay | Admitting: Cardiology

## 2014-09-03 DIAGNOSIS — C4441 Basal cell carcinoma of skin of scalp and neck: Secondary | ICD-10-CM | POA: Diagnosis not present

## 2014-09-03 DIAGNOSIS — L723 Sebaceous cyst: Secondary | ICD-10-CM | POA: Diagnosis not present

## 2014-09-03 DIAGNOSIS — C44319 Basal cell carcinoma of skin of other parts of face: Secondary | ICD-10-CM | POA: Diagnosis not present

## 2014-09-03 DIAGNOSIS — Z85828 Personal history of other malignant neoplasm of skin: Secondary | ICD-10-CM | POA: Diagnosis not present

## 2014-09-03 DIAGNOSIS — D485 Neoplasm of uncertain behavior of skin: Secondary | ICD-10-CM | POA: Diagnosis not present

## 2014-09-03 DIAGNOSIS — L821 Other seborrheic keratosis: Secondary | ICD-10-CM | POA: Diagnosis not present

## 2014-09-11 ENCOUNTER — Encounter: Payer: Self-pay | Admitting: Internal Medicine

## 2014-09-17 ENCOUNTER — Other Ambulatory Visit: Payer: Self-pay | Admitting: Cardiology

## 2014-09-17 DIAGNOSIS — I714 Abdominal aortic aneurysm, without rupture, unspecified: Secondary | ICD-10-CM

## 2014-09-19 ENCOUNTER — Ambulatory Visit (HOSPITAL_COMMUNITY)
Admission: RE | Admit: 2014-09-19 | Discharge: 2014-09-19 | Disposition: A | Discharge status: Home / Self Care | Payer: Commercial Managed Care - HMO | Source: Ambulatory Visit | Attending: Cardiology | Admitting: Cardiology

## 2014-09-19 DIAGNOSIS — I714 Abdominal aortic aneurysm, without rupture, unspecified: Secondary | ICD-10-CM

## 2014-09-23 DIAGNOSIS — E538 Deficiency of other specified B group vitamins: Secondary | ICD-10-CM | POA: Diagnosis not present

## 2014-12-31 ENCOUNTER — Other Ambulatory Visit: Payer: Self-pay | Admitting: Cardiology

## 2015-01-14 DIAGNOSIS — D3131 Benign neoplasm of right choroid: Secondary | ICD-10-CM | POA: Diagnosis not present

## 2015-01-14 DIAGNOSIS — H01001 Unspecified blepharitis right upper eyelid: Secondary | ICD-10-CM | POA: Diagnosis not present

## 2015-01-14 DIAGNOSIS — Z961 Presence of intraocular lens: Secondary | ICD-10-CM | POA: Diagnosis not present

## 2015-01-14 DIAGNOSIS — H5213 Myopia, bilateral: Secondary | ICD-10-CM | POA: Diagnosis not present

## 2015-01-22 DIAGNOSIS — M1990 Unspecified osteoarthritis, unspecified site: Secondary | ICD-10-CM | POA: Diagnosis not present

## 2015-01-22 DIAGNOSIS — M545 Low back pain: Secondary | ICD-10-CM | POA: Diagnosis not present

## 2015-01-22 DIAGNOSIS — Z6829 Body mass index (BMI) 29.0-29.9, adult: Secondary | ICD-10-CM | POA: Diagnosis not present

## 2015-01-22 DIAGNOSIS — G479 Sleep disorder, unspecified: Secondary | ICD-10-CM | POA: Diagnosis not present

## 2015-01-23 ENCOUNTER — Other Ambulatory Visit: Payer: Self-pay | Admitting: Internal Medicine

## 2015-01-23 DIAGNOSIS — M545 Low back pain: Secondary | ICD-10-CM

## 2015-02-05 ENCOUNTER — Other Ambulatory Visit: Payer: Commercial Managed Care - HMO

## 2015-02-05 ENCOUNTER — Ambulatory Visit
Admission: RE | Admit: 2015-02-05 | Discharge: 2015-02-05 | Disposition: A | Discharge status: Home / Self Care | Payer: Commercial Managed Care - HMO | Source: Ambulatory Visit | Attending: Internal Medicine | Admitting: Internal Medicine

## 2015-02-05 DIAGNOSIS — M5126 Other intervertebral disc displacement, lumbar region: Secondary | ICD-10-CM | POA: Diagnosis not present

## 2015-02-05 DIAGNOSIS — M545 Low back pain: Secondary | ICD-10-CM

## 2015-02-17 ENCOUNTER — Other Ambulatory Visit: Payer: Self-pay | Admitting: Internal Medicine

## 2015-02-17 DIAGNOSIS — G8929 Other chronic pain: Secondary | ICD-10-CM

## 2015-02-17 DIAGNOSIS — M545 Low back pain: Principal | ICD-10-CM

## 2015-02-25 DIAGNOSIS — L723 Sebaceous cyst: Secondary | ICD-10-CM | POA: Diagnosis not present

## 2015-02-25 DIAGNOSIS — D1801 Hemangioma of skin and subcutaneous tissue: Secondary | ICD-10-CM | POA: Diagnosis not present

## 2015-02-25 DIAGNOSIS — C4401 Basal cell carcinoma of skin of lip: Secondary | ICD-10-CM | POA: Diagnosis not present

## 2015-02-25 DIAGNOSIS — Z85828 Personal history of other malignant neoplasm of skin: Secondary | ICD-10-CM | POA: Diagnosis not present

## 2015-02-25 DIAGNOSIS — L821 Other seborrheic keratosis: Secondary | ICD-10-CM | POA: Diagnosis not present

## 2015-02-25 DIAGNOSIS — D485 Neoplasm of uncertain behavior of skin: Secondary | ICD-10-CM | POA: Diagnosis not present

## 2015-02-25 DIAGNOSIS — L570 Actinic keratosis: Secondary | ICD-10-CM | POA: Diagnosis not present

## 2015-02-25 DIAGNOSIS — C44319 Basal cell carcinoma of skin of other parts of face: Secondary | ICD-10-CM | POA: Diagnosis not present

## 2015-03-03 ENCOUNTER — Ambulatory Visit
Admission: RE | Admit: 2015-03-03 | Discharge: 2015-03-03 | Disposition: A | Discharge status: Home / Self Care | Payer: Commercial Managed Care - HMO | Source: Ambulatory Visit | Attending: Internal Medicine | Admitting: Internal Medicine

## 2015-03-03 DIAGNOSIS — G8929 Other chronic pain: Secondary | ICD-10-CM

## 2015-03-03 DIAGNOSIS — M545 Low back pain, unspecified: Secondary | ICD-10-CM

## 2015-03-03 MED ORDER — IOHEXOL 180 MG/ML  SOLN
1.0000 mL | Freq: Once | INTRAMUSCULAR | Status: AC | PRN
  Administered 2015-03-03: 1 mL via EPIDURAL

## 2015-03-03 MED ORDER — METHYLPREDNISOLONE ACETATE 40 MG/ML INJ SUSP (RADIOLOG
120.0000 mg | Freq: Once | INTRAMUSCULAR | Status: AC
  Administered 2015-03-03: 120 mg via EPIDURAL

## 2015-03-03 NOTE — Discharge Instructions (Signed)

## 2015-03-11 ENCOUNTER — Other Ambulatory Visit: Payer: Self-pay | Admitting: Internal Medicine

## 2015-03-11 DIAGNOSIS — G8929 Other chronic pain: Secondary | ICD-10-CM

## 2015-03-11 DIAGNOSIS — M545 Low back pain: Principal | ICD-10-CM

## 2015-03-17 ENCOUNTER — Ambulatory Visit
Admission: RE | Admit: 2015-03-17 | Discharge: 2015-03-17 | Disposition: A | Discharge status: Home / Self Care | Payer: Commercial Managed Care - HMO | Source: Ambulatory Visit | Attending: Internal Medicine | Admitting: Internal Medicine

## 2015-03-17 DIAGNOSIS — M545 Low back pain: Principal | ICD-10-CM

## 2015-03-17 DIAGNOSIS — G8929 Other chronic pain: Secondary | ICD-10-CM

## 2015-03-17 DIAGNOSIS — M47817 Spondylosis without myelopathy or radiculopathy, lumbosacral region: Secondary | ICD-10-CM | POA: Diagnosis not present

## 2015-03-17 MED ORDER — IOHEXOL 180 MG/ML  SOLN
1.0000 mL | Freq: Once | INTRAMUSCULAR | Status: AC | PRN
  Administered 2015-03-17: 1 mL via EPIDURAL

## 2015-03-17 MED ORDER — METHYLPREDNISOLONE ACETATE 40 MG/ML INJ SUSP (RADIOLOG
120.0000 mg | Freq: Once | INTRAMUSCULAR | Status: AC
  Administered 2015-03-17: 120 mg via EPIDURAL

## 2015-03-31 DIAGNOSIS — L820 Inflamed seborrheic keratosis: Secondary | ICD-10-CM | POA: Diagnosis not present

## 2015-03-31 DIAGNOSIS — Z85828 Personal history of other malignant neoplasm of skin: Secondary | ICD-10-CM | POA: Diagnosis not present

## 2015-07-04 NOTE — Progress Notes (Signed)
HPI: FU CAD. Abdominal ultrasound in November of 2011 showed mild dilatation of the distal abdominal aorta but not considered a true aneurysm. Cardiac CT performed in January of 2013 revealed less than 40% LAD. There was a less then 40% circumflex. RCA felt to be occluded with distal vessel filling by collaterals. Cardiac catheterization in January of 2013 showed a normal left main. There was a 30% proximal LAD. There was a 20% first diagonal. There was a 20% proximal third obtuse marginal. The right coronary artery was occluded. Ejection fraction was 55%. There was no abdominal aortic aneurysm. Plan was medical therapy. If his symptoms persisted despite medicines then PCI of the right coronary artery could be considered. Carotid Dopplers July 2014 showed 1-39% bilateral stenosis and follow-up recommended 2 years. Since I last saw him, the patient denies any dyspnea on exertion, orthopnea, PND, pedal edema, palpitations, syncope or chest pain. He does note pain in his calfs bilaterally with climbing hills.He had recent [facility] screening that showed no abdominal aortic aneurysm, no significant carotid disease and normal ABIs.  Current Outpatient Prescriptions  Medication Sig Dispense Refill  . aspirin EC 81 MG tablet Take 1 tablet (81 mg total) by mouth daily. 150 tablet 2  . cetirizine (ZYRTEC) 10 MG tablet Take 10 mg by mouth as needed.    . Cholecalciferol (VITAMIN D3) 2000 UNITS capsule Take 2,000 Units by mouth daily.      Marland Kitchen CINNAMON PO Take 2 tablets by mouth daily.     . Coenzyme Q10 (COQ-10 PO) Take 1 tablet by mouth daily.      Marland Kitchen escitalopram (LEXAPRO) 10 MG tablet Take 10 mg by mouth as needed.     . isosorbide mononitrate (IMDUR) 60 MG 24 hr tablet TAKE 1 TABLET EVERY DAY 90 tablet 1  . losartan (COZAAR) 50 MG tablet Take 25 mg by mouth daily.     . Misc Natural Products (OSTEO BI-FLEX JOINT SHIELD PO) Take by mouth 2 (two) times daily.    . pravastatin (PRAVACHOL) 40 MG tablet Take  40 mg by mouth daily.      Marland Kitchen SALINE NASAL SPRAY NA Place 1 spray into the nose as needed.    . zolpidem (AMBIEN) 10 MG tablet Take 10 mg by mouth at bedtime as needed.       No current facility-administered medications for this visit.   Facility-Administered Medications Ordered in Other Visits  Medication Dose Route Frequency Provider Last Rate Last Dose  . 0.9 %  sodium chloride infusion  250 mL Intravenous PRN Lelon Perla, MD      . sodium chloride 0.9 % injection 3 mL  3 mL Intravenous Q12H Lelon Perla, MD      . sodium chloride 0.9 % injection 3 mL  3 mL Intravenous PRN Lelon Perla, MD         Past Medical History  Diagnosis Date  . Adenomatous colon polyp   . Internal and external hemorrhoids without complication   . Arthritis   . COPD (chronic obstructive pulmonary disease) ([place])   . HTN (hypertension)   . Decreased hearing   . Rosacea   . Hyperlipemia   . Depression   . Diverticulosis   . Esophageal stricture   . Hiatal hernia   . CAD (coronary artery disease)     s/p cath 03/2011 with total RCA, left to right collaterals and mild nonobstructive disease in the LAD and LCX. Trying medical management but  could attempt PCI of the RCA  . Lung nodule     Noted on CTA Jan 2013. Needs follow upstudy    Past Surgical History  Procedure Laterality Date  . Tonsillectomy    . Cataract surgery    . Colonoscopy w/ biopsies and polypectomy  01/30/2007    diverticulosis, internal and external hemorrhoids  . Upper gastrointestinal endoscopy  02/19/2009    stricture, GERD, Hiatal hernia    Social History   Social History  . Marital Status: Married    Spouse Name: N/A  . Number of Children: N/A  . Years of Education: N/A   Occupational History  . Not on file.   Social History Main Topics  . Smoking status: Former Smoker    Quit date: 02/29/1992  . Smokeless tobacco: Never Used  . Alcohol Use: 3.5 oz/week    7 drink(s) per week  . Drug Use: No  . Sexual  Activity: Not on file   Other Topics Concern  . Not on file   Social History Narrative    Family History  Problem Relation Age of Onset  . Pancreatic cancer Mother   . Multiple myeloma Father   . Colon cancer Neg Hx   . Stomach cancer Neg Hx     ROS: no fevers or chills, productive cough, hemoptysis, dysphasia, odynophagia, melena, hematochezia, dysuria, hematuria, rash, seizure activity, orthopnea, PND, pedal edema, claudication. Remaining systems are negative.  Physical Exam: Well-developed well-nourished in no acute distress.  Skin is warm and dry.  HEENT is normal.  Neck is supple.  Chest is clear to auscultation with normal expansion.  Cardiovascular exam is regular rate and rhythm.  Abdominal exam nontender or distended. No masses palpated. Extremities show no edema. neuro grossly intact  ECG Sinus rhythm at a rate of 73. No ST changes.

## 2015-07-06 ENCOUNTER — Encounter: Payer: Self-pay | Admitting: Cardiology

## 2015-07-06 ENCOUNTER — Ambulatory Visit (INDEPENDENT_AMBULATORY_CARE_PROVIDER_SITE_OTHER): Payer: Commercial Managed Care - HMO | Admitting: Cardiology

## 2015-07-06 VITALS — BP 118/72 | HR 73 | Ht 73.0 in | Wt 210.0 lb

## 2015-07-06 DIAGNOSIS — I10 Essential (primary) hypertension: Secondary | ICD-10-CM

## 2015-07-06 DIAGNOSIS — E785 Hyperlipidemia, unspecified: Secondary | ICD-10-CM

## 2015-07-06 DIAGNOSIS — I2510 Atherosclerotic heart disease of native coronary artery without angina pectoris: Secondary | ICD-10-CM | POA: Diagnosis not present

## 2015-07-06 DIAGNOSIS — I2583 Coronary atherosclerosis due to lipid rich plaque: Secondary | ICD-10-CM

## 2015-07-06 DIAGNOSIS — I679 Cerebrovascular disease, unspecified: Secondary | ICD-10-CM

## 2015-07-06 NOTE — Patient Instructions (Signed)
Medication Instructions:   STOP PRAVASTATIN FOR 4 WEEKS- IF NO CHANGE IS SYMPTOMS-RESTART AT PREVIOUS DOSE  IF SYMPTOMS IMPROVE PLEASE CALL  Follow-Up:  Your physician wants you to follow-up in: Rand will receive a reminder letter in the mail two months in advance. If you don't receive a letter, please call our office to schedule the follow-up appointment.   If you need a refill on your cardiac medications before your next appointment, please call your pharmacy.

## 2015-07-06 NOTE — Assessment & Plan Note (Signed)
Blood pressure controlled. Continue present medications. 

## 2015-07-06 NOTE — Assessment & Plan Note (Addendum)
Patient is complaining of leg pain with ambulation. His symptoms sound potentially to be claudication but recent ABIs normal. We will discontinue Pravachol for 4 weeks. If symptoms improve we will try a different statin. If not he will resume pravastatin. We could consider exercise ABIs in the future.

## 2015-07-06 NOTE — Assessment & Plan Note (Addendum)
Continue aspirin. Hold statin to see if leg pain improves.

## 2015-07-06 NOTE — Assessment & Plan Note (Addendum)
Recent ultrasound at [facility] screen showed no aneurysm.

## 2015-07-06 NOTE — Assessment & Plan Note (Addendum)
Patient had recent follow-up carotid Dopplers at [facility] screening that showed no significant stenosis.

## 2015-08-05 ENCOUNTER — Telehealth: Payer: Self-pay | Admitting: Cardiology

## 2015-08-05 NOTE — Telephone Encounter (Signed)
Follow Up   Pt returned call. Called for results. Please call

## 2015-08-05 NOTE — Telephone Encounter (Signed)
Left message for pt to restart pravastatin at previous dose.

## 2015-08-05 NOTE — Telephone Encounter (Signed)
Pt stated that since stopping his Pravastatin he has noticed no difference in his leg pain when walking, especially up hill. Pt stated that he thinks it is his circulation in his legs. Pt wants to know if he should start back in his Pravastatin. Told pt would forward to MD and his nurse will be in contact with him.  Pt verbalized understanding, no additional questions at this time.

## 2015-08-05 NOTE — Telephone Encounter (Signed)
Resume pravachol Kirk Ruths

## 2015-08-19 DIAGNOSIS — E538 Deficiency of other specified B group vitamins: Secondary | ICD-10-CM | POA: Diagnosis not present

## 2015-08-19 DIAGNOSIS — I10 Essential (primary) hypertension: Secondary | ICD-10-CM | POA: Diagnosis not present

## 2015-08-19 DIAGNOSIS — R7301 Impaired fasting glucose: Secondary | ICD-10-CM | POA: Diagnosis not present

## 2015-08-19 DIAGNOSIS — Z125 Encounter for screening for malignant neoplasm of prostate: Secondary | ICD-10-CM | POA: Diagnosis not present

## 2015-08-19 DIAGNOSIS — E784 Other hyperlipidemia: Secondary | ICD-10-CM | POA: Diagnosis not present

## 2015-08-25 DIAGNOSIS — G4709 Other insomnia: Secondary | ICD-10-CM | POA: Diagnosis not present

## 2015-08-25 DIAGNOSIS — Z0000 Encounter for general adult medical examination without abnormal findings: Secondary | ICD-10-CM | POA: Diagnosis not present

## 2015-08-25 DIAGNOSIS — J449 Chronic obstructive pulmonary disease, unspecified: Secondary | ICD-10-CM | POA: Diagnosis not present

## 2015-08-25 DIAGNOSIS — M545 Low back pain: Secondary | ICD-10-CM | POA: Diagnosis not present

## 2015-08-25 DIAGNOSIS — I700 Atherosclerosis of aorta: Secondary | ICD-10-CM | POA: Diagnosis not present

## 2015-08-25 DIAGNOSIS — E538 Deficiency of other specified B group vitamins: Secondary | ICD-10-CM | POA: Diagnosis not present

## 2015-08-25 DIAGNOSIS — I2510 Atherosclerotic heart disease of native coronary artery without angina pectoris: Secondary | ICD-10-CM | POA: Diagnosis not present

## 2015-08-25 DIAGNOSIS — I7389 Other specified peripheral vascular diseases: Secondary | ICD-10-CM | POA: Diagnosis not present

## 2015-08-25 DIAGNOSIS — I6789 Other cerebrovascular disease: Secondary | ICD-10-CM | POA: Diagnosis not present

## 2015-08-26 DIAGNOSIS — Z85828 Personal history of other malignant neoplasm of skin: Secondary | ICD-10-CM | POA: Diagnosis not present

## 2015-08-26 DIAGNOSIS — D1801 Hemangioma of skin and subcutaneous tissue: Secondary | ICD-10-CM | POA: Diagnosis not present

## 2015-08-26 DIAGNOSIS — L821 Other seborrheic keratosis: Secondary | ICD-10-CM | POA: Diagnosis not present

## 2015-08-26 DIAGNOSIS — L570 Actinic keratosis: Secondary | ICD-10-CM | POA: Diagnosis not present

## 2015-09-14 ENCOUNTER — Other Ambulatory Visit: Payer: Self-pay | Admitting: Cardiology

## 2015-09-14 NOTE — Telephone Encounter (Signed)
Rx request sent to pharmacy.  

## 2015-10-06 ENCOUNTER — Other Ambulatory Visit: Payer: Self-pay | Admitting: Cardiology

## 2015-10-06 ENCOUNTER — Other Ambulatory Visit (HOSPITAL_COMMUNITY): Payer: Self-pay | Admitting: Internal Medicine

## 2015-10-06 DIAGNOSIS — I714 Abdominal aortic aneurysm, without rupture, unspecified: Secondary | ICD-10-CM

## 2015-10-16 ENCOUNTER — Ambulatory Visit (HOSPITAL_COMMUNITY)
Admission: RE | Admit: 2015-10-16 | Discharge: 2015-10-16 | Disposition: A | Discharge status: Home / Self Care | Payer: Commercial Managed Care - HMO | Source: Ambulatory Visit | Attending: Cardiology | Admitting: Cardiology

## 2015-10-16 DIAGNOSIS — I10 Essential (primary) hypertension: Secondary | ICD-10-CM | POA: Diagnosis not present

## 2015-10-16 DIAGNOSIS — F329 Major depressive disorder, single episode, unspecified: Secondary | ICD-10-CM | POA: Insufficient documentation

## 2015-10-16 DIAGNOSIS — I700 Atherosclerosis of aorta: Secondary | ICD-10-CM | POA: Diagnosis not present

## 2015-10-16 DIAGNOSIS — J449 Chronic obstructive pulmonary disease, unspecified: Secondary | ICD-10-CM | POA: Diagnosis not present

## 2015-10-16 DIAGNOSIS — I714 Abdominal aortic aneurysm, without rupture, unspecified: Secondary | ICD-10-CM

## 2015-10-16 DIAGNOSIS — E785 Hyperlipidemia, unspecified: Secondary | ICD-10-CM | POA: Diagnosis not present

## 2015-10-16 DIAGNOSIS — I2510 Atherosclerotic heart disease of native coronary artery without angina pectoris: Secondary | ICD-10-CM | POA: Insufficient documentation

## 2015-10-16 DIAGNOSIS — I708 Atherosclerosis of other arteries: Secondary | ICD-10-CM | POA: Diagnosis not present

## 2015-12-30 ENCOUNTER — Encounter: Payer: Self-pay | Admitting: Cardiology

## 2016-01-01 ENCOUNTER — Ambulatory Visit: Payer: Commercial Managed Care - HMO | Admitting: Cardiology

## 2016-01-11 NOTE — Progress Notes (Signed)
HPI: FU CAD. Cardiac CT performed in January of 2013 revealed less than 40% LAD. There was a less then 40% circumflex. RCA felt to be occluded with distal vessel filling by collaterals. Cardiac catheterization in January of 2013 showed a normal left main. There was a 30% proximal LAD. There was a 20% first diagonal. There was a 20% proximal third obtuse marginal. The right coronary artery was occluded. Ejection fraction was 55%. There was no abdominal aortic aneurysm. Plan was medical therapy. If his symptoms persisted despite medicines then PCI of the right coronary artery could be considered. He had previous [facility] screening that showed normal ABIs and no carotid disease. Abdominal ultrasound July 2017 showed abdominal aortic aneurysm measuring 3.3 x 3.6 cm. Since last seen pt denies dyspnea, chest pain, syncope, or pedal edema. Some pain in right lower ext with ambulation; improved since last visit.  Current Outpatient Prescriptions  Medication Sig Dispense Refill  . aspirin EC 81 MG tablet Take 1 tablet (81 mg total) by mouth daily. 150 tablet 2  . cetirizine (ZYRTEC) 10 MG tablet Take 10 mg by mouth as needed.    . Cholecalciferol (VITAMIN D3) 2000 UNITS capsule Take 2,000 Units by mouth daily.      Marland Kitchen CINNAMON PO Take 2 tablets by mouth daily.     . Coenzyme Q10 (COQ-10 PO) Take 1 tablet by mouth daily.      Marland Kitchen escitalopram (LEXAPRO) 10 MG tablet Take 10 mg by mouth as needed.     . isosorbide mononitrate (IMDUR) 60 MG 24 hr tablet TAKE 1 TABLET EVERY DAY 90 tablet 1  . losartan (COZAAR) 50 MG tablet Take 25 mg by mouth daily.     . Misc Natural Products (OSTEO BI-FLEX JOINT SHIELD PO) Take by mouth 2 (two) times daily.    . pravastatin (PRAVACHOL) 40 MG tablet Take 40 mg by mouth daily.      Marland Kitchen SALINE NASAL SPRAY NA Place 1 spray into the nose as needed.    . zolpidem (AMBIEN) 10 MG tablet Take 10 mg by mouth at bedtime as needed.       No current facility-administered medications  for this visit.    Facility-Administered Medications Ordered in Other Visits  Medication Dose Route Frequency Provider Last Rate Last Dose  . 0.9 %  sodium chloride infusion  250 mL Intravenous PRN Lelon Perla, MD      . sodium chloride 0.9 % injection 3 mL  3 mL Intravenous Q12H Lelon Perla, MD      . sodium chloride 0.9 % injection 3 mL  3 mL Intravenous PRN Lelon Perla, MD         Past Medical History:  Diagnosis Date  . Adenomatous colon polyp   . Arthritis   . CAD (coronary artery disease)    s/p cath 03/2011 with total RCA, left to right collaterals and mild nonobstructive disease in the LAD and LCX. Trying medical management but could attempt PCI of the RCA  . COPD (chronic obstructive pulmonary disease) ([place])   . Decreased hearing   . Depression   . Diverticulosis   . Esophageal stricture   . Hiatal hernia   . HTN (hypertension)   . Hyperlipemia   . Internal and external hemorrhoids without complication   . Lung nodule    Noted on CTA Jan 2013. Needs follow upstudy  . Rosacea     Past Surgical History:  Procedure Laterality Date  .  cataract surgery    . COLONOSCOPY W/ BIOPSIES AND POLYPECTOMY  01/30/2007   diverticulosis, internal and external hemorrhoids  . TONSILLECTOMY    . UPPER GASTROINTESTINAL ENDOSCOPY  02/19/2009   stricture, GERD, Hiatal hernia    Social History   Social History  . Marital status: Married    Spouse name: N/A  . Number of children: N/A  . Years of education: N/A   Occupational History  . Not on file.   Social History Main Topics  . Smoking status: Former Smoker    Quit date: 02/29/1992  . Smokeless tobacco: Never Used  . Alcohol use 3.5 oz/week    7 drink(s) per week  . Drug use: No  . Sexual activity: Not on file   Other Topics Concern  . Not on file   Social History Narrative  . No narrative on file    Family History  Problem Relation Age of Onset  . Pancreatic cancer Mother   . Multiple myeloma  Father   . Colon cancer Neg Hx   . Stomach cancer Neg Hx     ROS: no fevers or chills, productive cough, hemoptysis, dysphasia, odynophagia, melena, hematochezia, dysuria, hematuria, rash, seizure activity, orthopnea, PND, pedal edema, claudication. Remaining systems are negative.  Physical Exam: Well-developed well-nourished in no acute distress.  Skin is warm and dry.  HEENT is normal.  Neck is supple.  Chest is clear to auscultation with normal expansion.  Cardiovascular exam is regular rate and rhythm.  Abdominal exam nontender or distended. No masses palpated. Extremities show no edema. neuro grossly intact  ECG-normal sinus rhythm at a rate of 77. No ST changes.  A/P  1 hypertension-blood pressure controlled. Continue present medications.  2 hyperlipidemia-continue statin.  3 abdominal aortic aneurysm plan follow-up ultrasound July 2018.  4 carotid artery disease-continue aspirin and statin.  5 coronary artery disease-continue aspirin and statin.   6 question claudication right lower extremity-patient has seen a chiropractor with some improvement. Previous ABIs normal. If symptoms worsen we will plan ABIs with exercise. He is able to perform the majority of his activities with no pain.  Kirk Ruths, MD

## 2016-01-12 ENCOUNTER — Ambulatory Visit (INDEPENDENT_AMBULATORY_CARE_PROVIDER_SITE_OTHER): Payer: Commercial Managed Care - HMO | Admitting: Cardiology

## 2016-01-12 ENCOUNTER — Encounter: Payer: Self-pay | Admitting: Cardiology

## 2016-01-12 VITALS — BP 96/60 | HR 76 | Ht 72.0 in | Wt 207.0 lb

## 2016-01-12 DIAGNOSIS — I10 Essential (primary) hypertension: Secondary | ICD-10-CM

## 2016-01-12 DIAGNOSIS — I2510 Atherosclerotic heart disease of native coronary artery without angina pectoris: Secondary | ICD-10-CM | POA: Diagnosis not present

## 2016-01-12 DIAGNOSIS — E7800 Pure hypercholesterolemia, unspecified: Secondary | ICD-10-CM | POA: Diagnosis not present

## 2016-01-12 NOTE — Patient Instructions (Signed)

## 2016-03-10 ENCOUNTER — Other Ambulatory Visit: Payer: Self-pay | Admitting: Cardiology

## 2016-05-04 DIAGNOSIS — F329 Major depressive disorder, single episode, unspecified: Secondary | ICD-10-CM | POA: Diagnosis not present

## 2016-05-04 DIAGNOSIS — Z6828 Body mass index (BMI) 28.0-28.9, adult: Secondary | ICD-10-CM | POA: Diagnosis not present

## 2016-05-04 DIAGNOSIS — I10 Essential (primary) hypertension: Secondary | ICD-10-CM | POA: Diagnosis not present

## 2016-05-04 DIAGNOSIS — M545 Low back pain: Secondary | ICD-10-CM | POA: Diagnosis not present

## 2016-05-04 DIAGNOSIS — K429 Umbilical hernia without obstruction or gangrene: Secondary | ICD-10-CM | POA: Diagnosis not present

## 2016-05-25 DIAGNOSIS — L718 Other rosacea: Secondary | ICD-10-CM | POA: Diagnosis not present

## 2016-05-25 DIAGNOSIS — L570 Actinic keratosis: Secondary | ICD-10-CM | POA: Diagnosis not present

## 2016-05-25 DIAGNOSIS — D0439 Carcinoma in situ of skin of other parts of face: Secondary | ICD-10-CM | POA: Diagnosis not present

## 2016-05-25 DIAGNOSIS — D485 Neoplasm of uncertain behavior of skin: Secondary | ICD-10-CM | POA: Diagnosis not present

## 2016-05-25 DIAGNOSIS — D1801 Hemangioma of skin and subcutaneous tissue: Secondary | ICD-10-CM | POA: Diagnosis not present

## 2016-05-25 DIAGNOSIS — L821 Other seborrheic keratosis: Secondary | ICD-10-CM | POA: Diagnosis not present

## 2016-05-25 DIAGNOSIS — Z85828 Personal history of other malignant neoplasm of skin: Secondary | ICD-10-CM | POA: Diagnosis not present

## 2016-05-26 DIAGNOSIS — R509 Fever, unspecified: Secondary | ICD-10-CM | POA: Diagnosis not present

## 2016-05-26 DIAGNOSIS — J111 Influenza due to unidentified influenza virus with other respiratory manifestations: Secondary | ICD-10-CM | POA: Diagnosis not present

## 2016-05-26 DIAGNOSIS — Z6828 Body mass index (BMI) 28.0-28.9, adult: Secondary | ICD-10-CM | POA: Diagnosis not present

## 2016-06-08 DIAGNOSIS — D0439 Carcinoma in situ of skin of other parts of face: Secondary | ICD-10-CM | POA: Diagnosis not present

## 2016-06-08 DIAGNOSIS — Z85828 Personal history of other malignant neoplasm of skin: Secondary | ICD-10-CM | POA: Diagnosis not present

## 2016-06-29 ENCOUNTER — Emergency Department (HOSPITAL_COMMUNITY)
Admission: EM | Admit: 2016-06-29 | Discharge: 2016-06-29 | Disposition: A | Discharge status: Home / Self Care | Payer: Medicare HMO | Attending: Emergency Medicine | Admitting: Emergency Medicine

## 2016-06-29 DIAGNOSIS — I2510 Atherosclerotic heart disease of native coronary artery without angina pectoris: Secondary | ICD-10-CM | POA: Insufficient documentation

## 2016-06-29 DIAGNOSIS — Z87891 Personal history of nicotine dependence: Secondary | ICD-10-CM | POA: Insufficient documentation

## 2016-06-29 DIAGNOSIS — I10 Essential (primary) hypertension: Secondary | ICD-10-CM | POA: Insufficient documentation

## 2016-06-29 DIAGNOSIS — Z7982 Long term (current) use of aspirin: Secondary | ICD-10-CM | POA: Insufficient documentation

## 2016-06-29 DIAGNOSIS — R040 Epistaxis: Secondary | ICD-10-CM

## 2016-06-29 DIAGNOSIS — J449 Chronic obstructive pulmonary disease, unspecified: Secondary | ICD-10-CM | POA: Diagnosis not present

## 2016-06-29 LAB — CBC
HCT: 41.3 % (ref 39.0–52.0)
Hemoglobin: 14.2 g/dL (ref 13.0–17.0)
MCH: 31 pg (ref 26.0–34.0)
MCHC: 34.4 g/dL (ref 30.0–36.0)
MCV: 90.2 fL (ref 78.0–100.0)
Platelets: 186 10*3/uL (ref 150–400)
RBC: 4.58 MIL/uL (ref 4.22–5.81)
RDW: 13 % (ref 11.5–15.5)
WBC: 5.8 10*3/uL (ref 4.0–10.5)

## 2016-06-29 MED ORDER — OXYMETAZOLINE HCL 0.05 % NA SOLN
1.0000 | Freq: Once | NASAL | Status: AC
  Administered 2016-06-29: 1 via NASAL
  Filled 2016-06-29: qty 15

## 2016-06-29 MED ORDER — SILVER NITRATE-POT NITRATE 75-25 % EX MISC
2.0000 "application " | Freq: Once | CUTANEOUS | Status: AC
  Administered 2016-06-29: 2 via TOPICAL

## 2016-06-29 MED ORDER — LIDOCAINE-EPINEPHRINE-TETRACAINE (LET) SOLUTION
3.0000 mL | Freq: Once | NASAL | Status: AC
  Administered 2016-06-29: 3 mL via TOPICAL
  Filled 2016-06-29: qty 3

## 2016-06-29 NOTE — ED Provider Notes (Signed)
[facility] DEPT Provider Note   CSN: [id number] Arrival date & time: 06/29/16  1440  By signing my name below, I, Reola Mosher, attest that this documentation has been prepared under the direction and in the presence of Virgel Manifold, MD. Electronically Signed: Reola Mosher, ED Scribe. 06/29/16. 5:07 PM.  History   Chief Complaint Chief Complaint  Patient presents with  . Epistaxis   The history is provided by the patient. No language interpreter was used.    HPI Comments: Manuel Friedman is a 76 y.o. male who presents to the Emergency Department complaining of intermittent episodes of epistaxis involving the right nare beginning this morning approximately 10 hours ago. Per pt, he woke up with epistaxis this morning which resolved following forty minutes. He states that it intermittently started and stopped over throughout the day; however, prior to arrival he had an episode that lasted longer than normal and he was unable to control his bleeding. He has been applying pressure to the nose and also applied Afrin while in the ED which has temporarily resolved his bleeding currently. He notes associated sensation of post-nasal drip secondary to his episodes of bleeding and some mild fatigue/weakness as well. Pt takes 38m Asprin daily, but is not on anticoagulant/antiplatelet therapy otherwise. He denies sinus pain, or any other associated symptoms.   Past Medical History:  Diagnosis Date  . Adenomatous colon polyp   . Arthritis   . CAD (coronary artery disease)    s/p cath 03/2011 with total RCA, left to right collaterals and mild nonobstructive disease in the LAD and LCX. Trying medical management but could attempt PCI of the RCA  . [place]   . Decreased hearing   . Depression   . Diverticulosis   . Esophageal stricture   . Hiatal hernia   . HTN (hypertension)   . Hyperlipemia   . Internal and external hemorrhoids without  complication   . Lung nodule    Noted on CTA Jan 2013. Needs follow upstudy  . Rosacea    Patient Active Problem List   Diagnosis Date Noted  . Abdominal aortic aneurysm ([place] 07/04/2014  . Personal history of colonic polyps 03/27/2012  . Cerebrovascular disease 09/26/2011  . CAD (coronary artery disease) 04/18/2011  . Lung nodule 04/18/2011  . Chest pain 03/07/2011  . Hypertension 03/07/2011  . Hyperlipidemia 03/07/2011  . DYSPHAGIA 02/18/2009   Past Surgical History:  Procedure Laterality Date  . cataract surgery    . COLONOSCOPY W/ BIOPSIES AND POLYPECTOMY  01/30/2007   diverticulosis, internal and external hemorrhoids  . TONSILLECTOMY    . UPPER GASTROINTESTINAL ENDOSCOPY  02/19/2009   stricture, GERD, Hiatal hernia    Home Medications    Prior to Admission medications   Medication Sig Start Date End Date Taking? Authorizing Provider  aspirin EC 81 MG tablet Take 1 tablet (81 mg total) by mouth daily. 03/07/11   BLelon Perla MD  cetirizine (ZYRTEC) 10 MG tablet Take 10 mg by mouth as needed.    Historical Provider, MD  Cholecalciferol (VITAMIN D3) 2000 UNITS capsule Take 2,000 Units by mouth daily.      Historical Provider, MD  CINNAMON PO Take 2 tablets by mouth daily.     Historical Provider, MD  Coenzyme Q10 (COQ-10 PO) Take 1 tablet by mouth daily.      Historical Provider, MD  escitalopram (LEXAPRO) 10 MG tablet Take 10 mg by mouth as needed.  Historical Provider, MD  isosorbide mononitrate (IMDUR) 60 MG 24 hr tablet TAKE 1 TABLET EVERY DAY 03/11/16   Lelon Perla, MD  losartan (COZAAR) 50 MG tablet Take 25 mg by mouth daily.     Historical Provider, MD  Misc Natural Products (OSTEO BI-FLEX JOINT SHIELD PO) Take by mouth 2 (two) times daily.    Historical Provider, MD  pravastatin (PRAVACHOL) 40 MG tablet Take 40 mg by mouth daily.      Historical Provider, MD  SALINE NASAL SPRAY NA Place 1 spray into the nose as needed. 06/13/14   Historical Provider, MD    zolpidem (AMBIEN) 10 MG tablet Take 10 mg by mouth at bedtime as needed.      Historical Provider, MD   Family History Family History  Problem Relation Age of Onset  . Pancreatic cancer Mother   . Multiple myeloma Father   . Colon cancer Neg Hx   . Stomach cancer Neg Hx    Social History Social History  Substance Use Topics  . Smoking status: Former Smoker    Quit date: 02/29/1992  . Smokeless tobacco: Never Used  . Alcohol use 3.5 oz/week    7 drink(s) per week   Allergies   Patient has no known allergies.  Review of Systems Review of Systems  Constitutional: Positive for fatigue.  HENT: Positive for nosebleeds and postnasal drip. Negative for sinus pain.   Neurological: Positive for weakness.  All other systems reviewed and are negative.  Physical Exam Updated Vital Signs BP (!) 160/85 (BP Location: Right Arm)   Pulse 100   Temp 98.1 F (36.7 C) (Oral)   Resp 18   SpO2 97%   Physical Exam  Constitutional: He appears well-developed and well-nourished.  HENT:  Head: Normocephalic.  Right Ear: External ear normal.  Left Ear: External ear normal.  Oozing area from the septum of the right nare. Some blood noted in posterior oropharynx.   Eyes: Conjunctivae are normal. Right eye exhibits no discharge. Left eye exhibits no discharge.  Neck: Normal range of motion.  Cardiovascular: Normal rate, regular rhythm and normal heart sounds.   No murmur heard. Pulmonary/Chest: Effort normal and breath sounds normal. No respiratory distress. He has no wheezes. He has no rales.  Abdominal: Soft. There is no tenderness. There is no rebound and no guarding.  Musculoskeletal: Normal range of motion. He exhibits no edema or tenderness.  Neurological: He is alert. No cranial nerve deficit. Coordination normal.  Skin: Skin is warm and dry. No rash noted. No erythema. No pallor.  Psychiatric: He has a normal mood and affect. His behavior is normal.  Nursing note and vitals  reviewed.  ED Treatments / Results  DIAGNOSTIC STUDIES: Oxygen Saturation is 97% on RA, normal by my interpretation.   COORDINATION OF CARE: 5:06 PM-Discussed next steps with pt. Pt verbalized understanding and is agreeable with the plan.   Labs (all labs ordered are listed, but only abnormal results are displayed) Labs Reviewed  CBC   EKG  EKG Interpretation None      Radiology No results found.  Procedures .Epistaxis Management Date/Time: 06/29/2016 6:17 PM Performed by: Virgel Manifold Authorized by: Virgel Manifold   Consent:    Consent obtained:  Verbal   Consent given by:  Patient   Risks discussed:  Bleeding, infection, nasal injury and pain Universal protocol:    Procedure explained and questions answered to patient or proxy's satisfaction: yes     Relevant documents present and verified:  yes     Test results available and properly labeled: yes     Imaging studies available: yes     Required blood products, implants, devices, and special equipment available: yes     Site/side marked: yes     Time out called: yes     Patient identity confirmed:  Verbally with patient, arm band and provided demographic data Anesthesia (see MAR for exact dosages):    Anesthesia method:  Topical application   Topical anesthetic:  LET Procedure details:    Treatment site:  R septum   Treatment method:  Silver nitrate   Treatment complexity:  Extensive   Treatment episode: recurring   Post-procedure details:    Assessment:  Bleeding stopped   Patient tolerance of procedure:  Tolerated well, no immediate complications    Medications Ordered in ED Medications  oxymetazoline (AFRIN) 0.05 % nasal spray 1 spray (1 spray Each Nare Given 06/29/16 1616)   Initial Impression / Assessment and Plan / ED Course  I have reviewed the triage vital signs and the nursing notes.  Pertinent labs & imaging results that were available during my care of the patient were reviewed by me and  considered in my medical decision making (see chart for details).     Anterior epistaxis. LET then chemical cautery. Good hemastasis.   Final Clinical Impressions(s) / ED Diagnoses   Final diagnoses:  Anterior epistaxis   New Prescriptions New Prescriptions   No medications on file   I personally preformed the services scribed in my presence. The recorded information has been reviewed is accurate. Virgel Manifold, MD.     Virgel Manifold, MD 07/07/16 [phone]

## 2016-06-29 NOTE — ED Notes (Signed)
Pt stable, understands discharge instructions, and reasons for return.   

## 2016-06-29 NOTE — ED Triage Notes (Signed)
Pt with nose bleed that began this morning. States take a baby ASA daily. Pt A&O x4. VSS. Pt holding pressure to nose.

## 2016-07-05 DIAGNOSIS — H43813 Vitreous degeneration, bilateral: Secondary | ICD-10-CM | POA: Diagnosis not present

## 2016-07-05 DIAGNOSIS — D3131 Benign neoplasm of right choroid: Secondary | ICD-10-CM | POA: Diagnosis not present

## 2016-07-05 DIAGNOSIS — H5213 Myopia, bilateral: Secondary | ICD-10-CM | POA: Diagnosis not present

## 2016-07-05 DIAGNOSIS — Z961 Presence of intraocular lens: Secondary | ICD-10-CM | POA: Diagnosis not present

## 2016-07-26 ENCOUNTER — Other Ambulatory Visit: Payer: Self-pay | Admitting: Internal Medicine

## 2016-07-26 DIAGNOSIS — M545 Low back pain, unspecified: Secondary | ICD-10-CM

## 2016-08-05 DIAGNOSIS — Z6828 Body mass index (BMI) 28.0-28.9, adult: Secondary | ICD-10-CM | POA: Diagnosis not present

## 2016-08-05 DIAGNOSIS — K429 Umbilical hernia without obstruction or gangrene: Secondary | ICD-10-CM | POA: Diagnosis not present

## 2016-08-29 ENCOUNTER — Ambulatory Visit
Admission: RE | Admit: 2016-08-29 | Discharge: 2016-08-29 | Disposition: A | Discharge status: Home / Self Care | Payer: Medicare HMO | Source: Ambulatory Visit | Attending: Internal Medicine | Admitting: Internal Medicine

## 2016-08-29 DIAGNOSIS — M5126 Other intervertebral disc displacement, lumbar region: Secondary | ICD-10-CM | POA: Diagnosis not present

## 2016-08-29 DIAGNOSIS — K429 Umbilical hernia without obstruction or gangrene: Secondary | ICD-10-CM | POA: Diagnosis not present

## 2016-08-29 DIAGNOSIS — M545 Low back pain, unspecified: Secondary | ICD-10-CM

## 2016-09-14 DIAGNOSIS — Z6828 Body mass index (BMI) 28.0-28.9, adult: Secondary | ICD-10-CM | POA: Diagnosis not present

## 2016-09-14 DIAGNOSIS — M5416 Radiculopathy, lumbar region: Secondary | ICD-10-CM | POA: Diagnosis not present

## 2016-09-14 DIAGNOSIS — R030 Elevated blood-pressure reading, without diagnosis of hypertension: Secondary | ICD-10-CM | POA: Diagnosis not present

## 2016-09-16 DIAGNOSIS — Z125 Encounter for screening for malignant neoplasm of prostate: Secondary | ICD-10-CM | POA: Diagnosis not present

## 2016-09-16 DIAGNOSIS — R7301 Impaired fasting glucose: Secondary | ICD-10-CM | POA: Diagnosis not present

## 2016-09-16 DIAGNOSIS — E784 Other hyperlipidemia: Secondary | ICD-10-CM | POA: Diagnosis not present

## 2016-09-16 DIAGNOSIS — I10 Essential (primary) hypertension: Secondary | ICD-10-CM | POA: Diagnosis not present

## 2016-09-16 DIAGNOSIS — E538 Deficiency of other specified B group vitamins: Secondary | ICD-10-CM | POA: Diagnosis not present

## 2016-09-23 DIAGNOSIS — I6789 Other cerebrovascular disease: Secondary | ICD-10-CM | POA: Diagnosis not present

## 2016-09-23 DIAGNOSIS — H16139 Photokeratitis, unspecified eye: Secondary | ICD-10-CM | POA: Diagnosis not present

## 2016-09-23 DIAGNOSIS — I700 Atherosclerosis of aorta: Secondary | ICD-10-CM | POA: Diagnosis not present

## 2016-09-23 DIAGNOSIS — Z0000 Encounter for general adult medical examination without abnormal findings: Secondary | ICD-10-CM | POA: Diagnosis not present

## 2016-09-23 DIAGNOSIS — I7389 Other specified peripheral vascular diseases: Secondary | ICD-10-CM | POA: Diagnosis not present

## 2016-09-23 DIAGNOSIS — I2510 Atherosclerotic heart disease of native coronary artery without angina pectoris: Secondary | ICD-10-CM | POA: Diagnosis not present

## 2016-09-23 DIAGNOSIS — J449 Chronic obstructive pulmonary disease, unspecified: Secondary | ICD-10-CM | POA: Diagnosis not present

## 2016-09-23 DIAGNOSIS — F3289 Other specified depressive episodes: Secondary | ICD-10-CM | POA: Diagnosis not present

## 2016-09-23 DIAGNOSIS — K429 Umbilical hernia without obstruction or gangrene: Secondary | ICD-10-CM | POA: Diagnosis not present

## 2016-09-24 ENCOUNTER — Other Ambulatory Visit: Payer: Self-pay | Admitting: Cardiology

## 2016-10-21 DIAGNOSIS — M5416 Radiculopathy, lumbar region: Secondary | ICD-10-CM | POA: Diagnosis not present

## 2016-12-17 DIAGNOSIS — Z23 Encounter for immunization: Secondary | ICD-10-CM | POA: Diagnosis not present

## 2016-12-26 NOTE — Progress Notes (Signed)
HPI: FU CAD. Cardiac CT performed in January of 2013 revealed less than 40% LAD. There was a less then 40% circumflex. RCA felt to be occluded with distal vessel filling by collaterals. Cardiac catheterization in January of 2013 showed a normal left main. There was a 30% proximal LAD. There was a 20% first diagonal. There was a 20% proximal third obtuse marginal. The right coronary artery was occluded. Ejection fraction was 55%. There was no abdominal aortic aneurysm. Plan was medical therapy. If his symptoms persisted despite medicines then PCI of the right coronary artery could be considered. He had previous [facility] screening that showed normal ABIs and no carotid disease. Abdominal ultrasound July 2017 showed abdominal aortic aneurysm measuring 3.3 x 3.6 cm. Since last seen patient denies dyspnea, chest pain, palpitations or syncope. He has claudication predominantly in his right calf with walking up hills or climbing stairs.  Current Outpatient Prescriptions  Medication Sig Dispense Refill  . aspirin EC 81 MG tablet Take 1 tablet (81 mg total) by mouth daily. 150 tablet 2  . cetirizine (ZYRTEC) 10 MG tablet Take 10 mg by mouth as needed.    . Cholecalciferol (VITAMIN D3) 2000 UNITS capsule Take 2,000 Units by mouth daily.      Marland Kitchen CINNAMON PO Take 2 tablets by mouth daily.     . Coenzyme Q10 (COQ-10 PO) Take 1 tablet by mouth daily.      Marland Kitchen escitalopram (LEXAPRO) 10 MG tablet Take 10 mg by mouth as needed.     . isosorbide mononitrate (IMDUR) 60 MG 24 hr tablet TAKE 1 TABLET EVERY DAY 90 tablet 3  . losartan (COZAAR) 50 MG tablet Take 25 mg by mouth daily.     . meloxicam (MOBIC) 15 MG tablet Take 15 mg by mouth daily.    . pravastatin (PRAVACHOL) 40 MG tablet Take 40 mg by mouth daily.      Marland Kitchen zolpidem (AMBIEN) 10 MG tablet Take 10 mg by mouth at bedtime as needed.       No current facility-administered medications for this visit.    Facility-Administered Medications Ordered in Other  Visits  Medication Dose Route Frequency Provider Last Rate Last Dose  . 0.9 %  sodium chloride infusion  250 mL Intravenous PRN Lelon Perla, MD      . sodium chloride 0.9 % injection 3 mL  3 mL Intravenous Q12H Lelon Perla, MD      . sodium chloride 0.9 % injection 3 mL  3 mL Intravenous PRN Stanford Breed Denice Bors, MD         Past Medical History:  Diagnosis Date  . Adenomatous colon polyp   . Arthritis   . CAD (coronary artery disease)    s/p cath 03/2011 with total RCA, left to right collaterals and mild nonobstructive disease in the LAD and LCX. Trying medical management but could attempt PCI of the RCA  . COPD (chronic obstructive pulmonary disease) ([place])   . Decreased hearing   . Depression   . Diverticulosis   . Esophageal stricture   . Hiatal hernia   . HTN (hypertension)   . Hyperlipemia   . Internal and external hemorrhoids without complication   . Lung nodule    Noted on CTA Jan 2013. Needs follow upstudy  . Rosacea     Past Surgical History:  Procedure Laterality Date  . cataract surgery    . COLONOSCOPY W/ BIOPSIES AND POLYPECTOMY  01/30/2007   diverticulosis, internal  and external hemorrhoids  . TONSILLECTOMY    . UPPER GASTROINTESTINAL ENDOSCOPY  02/19/2009   stricture, GERD, Hiatal hernia    Social History   Social History  . Marital status: Married    Spouse name: N/A  . Number of children: N/A  . Years of education: N/A   Occupational History  . Not on file.   Social History Main Topics  . Smoking status: Former Smoker    Quit date: 02/29/1992  . Smokeless tobacco: Never Used  . Alcohol use 3.5 oz/week    7 drink(s) per week  . Drug use: No  . Sexual activity: Not on file   Other Topics Concern  . Not on file   Social History Narrative  . No narrative on file    Family History  Problem Relation Age of Onset  . Pancreatic cancer Mother   . Multiple myeloma Father   . Colon cancer Neg Hx   . Stomach cancer Neg Hx     ROS:  RLE claudication but no fevers or chills, productive cough, hemoptysis, dysphasia, odynophagia, melena, hematochezia, dysuria, hematuria, rash, seizure activity, orthopnea, PND, pedal edema. Remaining systems are negative.  Physical Exam: Well-developed well-nourished in no acute distress.  Skin is warm and dry.  HEENT is normal.  Neck is supple.  Chest is clear to auscultation with normal expansion.  Cardiovascular exam is regular rate and rhythm.  Abdominal exam nontender or distended. No masses palpated. Extremities show no edema. neuro grossly intact  ECG- normal sinus rhythm at a rate of 83. Right bundle branch block. personally reviewed  A/P  1 coronary artery disease-patient denies chest pain. Plan to continue medical therapy with aspirin and statin.  2 abdominal aortic aneurysm-schedule abdominal CTA to further assess.  3 carotid artery disease-continue aspirin and statin.  4 hypertension-blood pressure is controlled. Continue present medications. Check potassium and renal function.  5 hyperlipidemia-given documented coronary artery disease I will discontinue pravastatin. Instead we will treat with Lipitor 80 mg daily. Check lipids and liver in 4 weeks.  6 claudication-patient describes pain in his right calf that limits his activities. It occurs with walking up hills or climbing stairs and is relieved with rest. Previous ABIs at rest normal. I will arrange a CTA of his abdominal aorta and lower extremities to further assess. If he has significant peripheral vascular disease I will ask Dr. Gwenlyn Found or Dr. Fletcher Anon to review.  Kirk Ruths, MD

## 2017-01-09 ENCOUNTER — Encounter: Payer: Self-pay | Admitting: Cardiology

## 2017-01-09 ENCOUNTER — Ambulatory Visit (INDEPENDENT_AMBULATORY_CARE_PROVIDER_SITE_OTHER): Payer: Medicare HMO | Admitting: Cardiology

## 2017-01-09 VITALS — BP 108/64 | HR 83 | Ht 72.0 in | Wt 201.0 lb

## 2017-01-09 DIAGNOSIS — I2510 Atherosclerotic heart disease of native coronary artery without angina pectoris: Secondary | ICD-10-CM | POA: Diagnosis not present

## 2017-01-09 DIAGNOSIS — I739 Peripheral vascular disease, unspecified: Secondary | ICD-10-CM

## 2017-01-09 DIAGNOSIS — E7800 Pure hypercholesterolemia, unspecified: Secondary | ICD-10-CM

## 2017-01-09 DIAGNOSIS — I10 Essential (primary) hypertension: Secondary | ICD-10-CM | POA: Diagnosis not present

## 2017-01-09 DIAGNOSIS — I714 Abdominal aortic aneurysm, without rupture, unspecified: Secondary | ICD-10-CM

## 2017-01-09 LAB — BASIC METABOLIC PANEL
BUN/Creatinine Ratio: 16 (ref 10–24)
BUN: 20 mg/dL (ref 8–27)
CALCIUM: 9.4 mg/dL (ref 8.6–10.2)
CO2: 23 mmol/L (ref 20–29)
Chloride: 104 mmol/L (ref 96–106)
Creatinine, Ser: 1.26 mg/dL (ref 0.76–1.27)
GFR calc Af Amer: 63 mL/min/{1.73_m2} (ref >59)
GFR, EST NON AFRICAN AMERICAN: 54 mL/min/{1.73_m2} — AB (ref >59)
GLUCOSE: 82 mg/dL (ref 65–99)
Potassium: 5.2 mmol/L (ref 3.5–5.2)
SODIUM: 141 mmol/L (ref 134–144)

## 2017-01-09 LAB — LIPID PANEL
CHOL/HDL RATIO: 2.3 ratio (ref 0.0–5.0)
Cholesterol, Total: 155 mg/dL (ref 100–199)
HDL: 67 mg/dL (ref >39)
LDL Calculated: 72 mg/dL (ref 0–99)
Triglycerides: 80 mg/dL (ref 0–149)
VLDL CHOLESTEROL CAL: 16 mg/dL (ref 5–40)

## 2017-01-09 LAB — HEPATIC FUNCTION PANEL
ALBUMIN: 4.4 g/dL (ref 3.5–4.8)
ALT: 18 IU/L (ref 0–44)
AST: 19 IU/L (ref 0–40)
Alkaline Phosphatase: 55 IU/L (ref 39–117)
Bilirubin Total: 0.9 mg/dL (ref 0.0–1.2)
Bilirubin, Direct: 0.26 mg/dL (ref 0.00–0.40)
TOTAL PROTEIN: 6.6 g/dL (ref 6.0–8.5)

## 2017-01-09 MED ORDER — ATORVASTATIN CALCIUM 80 MG PO TABS: 80.0000 mg | ORAL_TABLET | Freq: Every day | ORAL | 3 refills | Status: DC

## 2017-01-09 NOTE — Patient Instructions (Signed)
Medication Instructions:   STOP PRAVASTATIN  START ATORVASTATIN 80 MG ONCE DAILY  Labwork:  Your physician recommends that you HAVE LAB WORK TODAY  Your physician recommends that you return for lab work [place] ATORVASTATIN PRIOR TO EATING  Testing/Procedures:  Non-Cardiac CT Angiography (CTA), is a special type of CT scan that uses a computer to produce multi-dimensional views of major blood vessels throughout the body. In CT angiography, a contrast material is injected through an IV to help visualize the blood vessels  CTA OF ABD AND BIFEM FOR AAA AND CLAUDICATION AT THE [address] LOCATION  Follow-Up:  Your physician wants you to follow-up in: [place] will receive a reminder letter in the mail two months in advance. If you don't receive a letter, please call our office to schedule the follow-up appointment.   If you need a refill on your cardiac medications before your next appointment, please call your pharmacy.   Any Other Special Instructions Will Be Listed Below (If Applicable).

## 2017-01-10 ENCOUNTER — Telehealth: Payer: Self-pay | Admitting: Cardiology

## 2017-01-10 NOTE — Telephone Encounter (Signed)
Called patient and gave him the date, time, location and instructions for his test at [facility].

## 2017-01-12 ENCOUNTER — Encounter: Payer: Self-pay | Admitting: Cardiology

## 2017-01-12 ENCOUNTER — Ambulatory Visit (INDEPENDENT_AMBULATORY_CARE_PROVIDER_SITE_OTHER)
Admission: RE | Admit: 2017-01-12 | Discharge: 2017-01-12 | Disposition: A | Discharge status: Home / Self Care | Payer: Medicare HMO | Source: Ambulatory Visit | Attending: Cardiology | Admitting: Cardiology

## 2017-01-12 DIAGNOSIS — I739 Peripheral vascular disease, unspecified: Secondary | ICD-10-CM

## 2017-01-12 DIAGNOSIS — I714 Abdominal aortic aneurysm, without rupture, unspecified: Secondary | ICD-10-CM

## 2017-01-12 DIAGNOSIS — M79661 Pain in right lower leg: Secondary | ICD-10-CM | POA: Diagnosis not present

## 2017-01-12 MED ORDER — IOPAMIDOL (ISOVUE-370) INJECTION 76%
125.0000 mL | Freq: Once | INTRAVENOUS | Status: AC | PRN
  Administered 2017-01-12: 125 mL via INTRAVENOUS

## 2017-01-12 NOTE — Telephone Encounter (Signed)
This encounter was created in error - please disregard.

## 2017-01-12 NOTE — Telephone Encounter (Signed)
°  Follow Up  Pt calling following up on CT results. Please call.

## 2017-02-03 ENCOUNTER — Encounter: Payer: Self-pay | Admitting: Cardiovascular Disease

## 2017-02-03 ENCOUNTER — Ambulatory Visit: Payer: Medicare HMO | Admitting: Cardiovascular Disease

## 2017-02-03 VITALS — BP 116/60 | HR 86 | Ht 72.0 in | Wt 202.6 lb

## 2017-02-03 DIAGNOSIS — I739 Peripheral vascular disease, unspecified: Secondary | ICD-10-CM | POA: Diagnosis not present

## 2017-02-03 MED ORDER — CILOSTAZOL 50 MG PO TABS: 50.0000 mg | ORAL_TABLET | Freq: Two times a day (BID) | ORAL | 0 refills | Status: DC

## 2017-02-03 NOTE — Patient Instructions (Addendum)
Medication Instructions: Your physician recommends that you continue on your current medications as directed. Please refer to the Current Medication list given to you today.  START Pletal (Cilostazol) 50 mg twice daily.   Testing/Procedures: Your physician has requested that you have a lower extremity arterial duplex. During this test, ultrasound is used to evaluate arterial blood flow in the legs. Allow one hour for this exam. There are no restrictions or special instructions.  Your physician has requested that you have an ankle brachial index (ABI). During this test an ultrasound and blood pressure cuff are used to evaluate the arteries that supply the arms and legs with blood. Allow thirty minutes for this exam. There are no restrictions or special instructions.  Follow-Up: Your physician recommends that you schedule a follow-up appointment in: 3 months with Dr. Gwenlyn Found.  If you need a refill on your cardiac medications before your next appointment, please call your pharmacy.

## 2017-02-03 NOTE — Addendum Note (Signed)
Addended by: Therisa Doyne on: 02/03/2017 09:43 AM   Modules accepted: Orders

## 2017-02-03 NOTE — Assessment & Plan Note (Signed)
Manuel Friedman was referred to me by Dr. Stanford Breed for evaluation of symptomatic PAD. He does have a history of coronary artery disease as well as treated hypertension and hyperlipidemia. He's noticed right much greater than left lower extremity claudication for several years especially while walking up a hill. He also has a small abdominal aortic aneurysm measuring approximately 3.6 cm. On exam his left pedal pulses much better than his right. I am going to begin him on Pletal 50 mg by mouth twice a day and get lower extremity are chilled Doppler studies. All see him back in 3 months. If he's had no symptomatically for pharmacologic therapy we will proceed with angiography and potential endovascular therapy depending on what his lower external arterial Dopplers reveal.

## 2017-02-03 NOTE — Progress Notes (Signed)
02/03/2017 Manuel Friedman   [birth year]  [id number]  Primary Physician [name], MD Primary Cardiologist: Lorretta Harp MD Lupe Carney, [place]  HPI:  Manuel Friedman is a 76 y.o. male married Caucasian male father of 2, grandfather of 3 granddaughters referred by Dr. Stanford Breed for evaluation treatment of symptomatic PAD. He does have a history of CAD status post catheterization gyri 2013 showing an occluded RCA with normal EF. She'll pounds of the true hypertension and hyperlipidemia. He smoked remotely. He does have a scotch every evening which she enjoys. He complains of right much greater than the left lower extremity claudication which is lifestyle limiting over the last several years especially when walking up a hill. He does have a small abdominal aortic caries and measuring 3.6 cm both by duplex ultrasound and recent CTA.   Current Meds  Medication Sig  . aspirin EC 81 MG tablet Take 1 tablet (81 mg total) by mouth daily.  Marland Kitchen atorvastatin (LIPITOR) 80 MG tablet Take 1 tablet (80 mg total) by mouth daily.  . cetirizine (ZYRTEC) 10 MG tablet Take 10 mg by mouth as needed.  . Cholecalciferol (VITAMIN D3) 2000 UNITS capsule Take 2,000 Units by mouth daily.    Marland Kitchen CINNAMON PO Take 2 tablets by mouth daily.   . Coenzyme Q10 (COQ-10 PO) Take 1 tablet by mouth daily.    Marland Kitchen escitalopram (LEXAPRO) 10 MG tablet Take 10 mg by mouth as needed.   . isosorbide mononitrate (IMDUR) 60 MG 24 hr tablet TAKE 1 TABLET EVERY DAY  . losartan (COZAAR) 50 MG tablet Take 25 mg by mouth daily.   Marland Kitchen zolpidem (AMBIEN) 10 MG tablet Take 10 mg by mouth at bedtime as needed.       No Known Allergies  Social History   Socioeconomic History  . Marital status: Married    Spouse name: Not on file  . Number of children: Not on file  . Years of education: Not on file  . Highest education level: Not on file  Social Needs  . Financial resource strain: Not on file  . Food insecurity - worry: Not on  file  . Food insecurity - inability: Not on file  . Transportation needs - medical: Not on file  . Transportation needs - non-medical: Not on file  Occupational History  . Not on file  Tobacco Use  . Smoking status: Former Smoker    Last attempt to quit: 02/29/1992    Years since quitting: 24.9  . Smokeless tobacco: Never Used  Substance and Sexual Activity  . Alcohol use: Yes    Alcohol/week: 3.5 oz    Types: 7 drink(s) per week  . Drug use: No  . Sexual activity: Not on file  Other Topics Concern  . Not on file  Social History Narrative  . Not on file     Review of Systems: General: negative for chills, fever, night sweats or weight changes.  Cardiovascular: negative for chest pain, dyspnea on exertion, edema, orthopnea, palpitations, paroxysmal nocturnal dyspnea or shortness of breath Dermatological: negative for rash Respiratory: negative for cough or wheezing Urologic: negative for hematuria Abdominal: negative for nausea, vomiting, diarrhea, bright red blood per rectum, melena, or hematemesis Neurologic: negative for visual changes, syncope, or dizziness All other systems reviewed and are otherwise negative except as noted above.    Blood pressure 116/60, pulse 86, height 6' (1.829 m), weight 202 lb 9.6 oz (91.9 kg), SpO2 93 %.  General  appearance: alert and no distress Neck: no adenopathy, no carotid bruit, no JVD, supple, symmetrical, trachea midline and thyroid not enlarged, symmetric, no tenderness/mass/nodules Lungs: clear to auscultation bilaterally Heart: regular rate and rhythm, S1, S2 normal, no murmur, click, rub or gallop Extremities: extremities normal, atraumatic, no cyanosis or edema Pulses: 2+ left pedal pulses 1+ right Skin: Skin color, texture, turgor normal. No rashes or lesions Neurologic: Alert and oriented X 3, normal strength and tone. Normal symmetric reflexes. Normal coordination and gait  EKG not performed today  ASSESSMENT AND PLAN:    Peripheral arterial disease [facility]) Mr. Flaum was referred to me by Dr. Stanford Breed for evaluation of symptomatic PAD. He does have a history of coronary artery disease as well as treated hypertension and hyperlipidemia. He's noticed right much greater than left lower extremity claudication for several years especially while walking up a hill. He also has a small abdominal aortic aneurysm measuring approximately 3.6 cm. On exam his left pedal pulses much better than his right. I am going to begin him on Pletal 50 mg by mouth twice a day and get lower extremity are chilled Doppler studies. All see him back in 3 months. If he's had no symptomatically for pharmacologic therapy we will proceed with angiography and potential endovascular therapy depending on what his lower external arterial Dopplers reveal.      Lorretta Harp MD [facility], [facility] 02/03/2017 9:28 AM

## 2017-02-21 ENCOUNTER — Ambulatory Visit (HOSPITAL_COMMUNITY)
Admission: RE | Admit: 2017-02-21 | Discharge: 2017-02-21 | Disposition: A | Discharge status: Home / Self Care | Payer: Medicare HMO | Source: Ambulatory Visit | Attending: Cardiology | Admitting: Cardiology

## 2017-02-21 DIAGNOSIS — I739 Peripheral vascular disease, unspecified: Secondary | ICD-10-CM | POA: Insufficient documentation

## 2017-02-21 DIAGNOSIS — I6523 Occlusion and stenosis of bilateral carotid arteries: Secondary | ICD-10-CM | POA: Diagnosis not present

## 2017-02-28 NOTE — Progress Notes (Signed)
OK to sched sooner if necessary

## 2017-03-01 DIAGNOSIS — L821 Other seborrheic keratosis: Secondary | ICD-10-CM | POA: Diagnosis not present

## 2017-03-01 DIAGNOSIS — D044 Carcinoma in situ of skin of scalp and neck: Secondary | ICD-10-CM | POA: Diagnosis not present

## 2017-03-01 DIAGNOSIS — Z85828 Personal history of other malignant neoplasm of skin: Secondary | ICD-10-CM | POA: Diagnosis not present

## 2017-03-01 DIAGNOSIS — D485 Neoplasm of uncertain behavior of skin: Secondary | ICD-10-CM | POA: Diagnosis not present

## 2017-03-01 DIAGNOSIS — L570 Actinic keratosis: Secondary | ICD-10-CM | POA: Diagnosis not present

## 2017-03-13 DIAGNOSIS — H9190 Unspecified hearing loss, unspecified ear: Secondary | ICD-10-CM | POA: Insufficient documentation

## 2017-03-13 DIAGNOSIS — L719 Rosacea, unspecified: Secondary | ICD-10-CM | POA: Insufficient documentation

## 2017-03-13 DIAGNOSIS — E785 Hyperlipidemia, unspecified: Secondary | ICD-10-CM | POA: Insufficient documentation

## 2017-03-13 DIAGNOSIS — J449 Chronic obstructive pulmonary disease, unspecified: Secondary | ICD-10-CM | POA: Insufficient documentation

## 2017-03-13 DIAGNOSIS — K449 Diaphragmatic hernia without obstruction or gangrene: Secondary | ICD-10-CM | POA: Insufficient documentation

## 2017-03-13 DIAGNOSIS — F32A Depression, unspecified: Secondary | ICD-10-CM | POA: Insufficient documentation

## 2017-03-13 DIAGNOSIS — I10 Essential (primary) hypertension: Secondary | ICD-10-CM | POA: Insufficient documentation

## 2017-03-13 DIAGNOSIS — K222 Esophageal obstruction: Secondary | ICD-10-CM | POA: Insufficient documentation

## 2017-03-13 DIAGNOSIS — M1990 Unspecified osteoarthritis, unspecified site: Secondary | ICD-10-CM | POA: Insufficient documentation

## 2017-03-13 DIAGNOSIS — K5790 Diverticulosis of intestine, part unspecified, without perforation or abscess without bleeding: Secondary | ICD-10-CM | POA: Insufficient documentation

## 2017-03-13 DIAGNOSIS — K644 Residual hemorrhoidal skin tags: Secondary | ICD-10-CM | POA: Insufficient documentation

## 2017-03-13 DIAGNOSIS — K648 Other hemorrhoids: Secondary | ICD-10-CM

## 2017-03-13 DIAGNOSIS — F329 Major depressive disorder, single episode, unspecified: Secondary | ICD-10-CM | POA: Insufficient documentation

## 2017-03-13 DIAGNOSIS — D126 Benign neoplasm of colon, unspecified: Secondary | ICD-10-CM | POA: Insufficient documentation

## 2017-03-15 ENCOUNTER — Other Ambulatory Visit: Payer: Self-pay

## 2017-03-15 DIAGNOSIS — E7800 Pure hypercholesterolemia, unspecified: Secondary | ICD-10-CM

## 2017-03-15 MED ORDER — ATORVASTATIN CALCIUM 80 MG PO TABS: 80.0000 mg | ORAL_TABLET | Freq: Every day | ORAL | 3 refills | Status: DC

## 2017-03-16 ENCOUNTER — Other Ambulatory Visit: Payer: Self-pay

## 2017-03-16 DIAGNOSIS — E7800 Pure hypercholesterolemia, unspecified: Secondary | ICD-10-CM

## 2017-03-16 MED ORDER — ATORVASTATIN CALCIUM 80 MG PO TABS: 80.0000 mg | ORAL_TABLET | Freq: Every day | ORAL | 2 refills | Status: DC

## 2017-03-22 ENCOUNTER — Ambulatory Visit: Payer: Medicare HMO | Admitting: Cardiovascular Disease

## 2017-03-22 ENCOUNTER — Ambulatory Visit
Admission: RE | Admit: 2017-03-22 | Discharge: 2017-03-22 | Disposition: A | Discharge status: Home / Self Care | Payer: Medicare HMO | Source: Ambulatory Visit | Attending: Cardiovascular Disease | Admitting: Cardiovascular Disease

## 2017-03-22 ENCOUNTER — Encounter: Payer: Self-pay | Admitting: Cardiovascular Disease

## 2017-03-22 VITALS — BP 128/70 | HR 90 | Ht 72.0 in | Wt 206.0 lb

## 2017-03-22 DIAGNOSIS — I10 Essential (primary) hypertension: Secondary | ICD-10-CM | POA: Diagnosis not present

## 2017-03-22 DIAGNOSIS — Z01810 Encounter for preprocedural cardiovascular examination: Secondary | ICD-10-CM | POA: Diagnosis not present

## 2017-03-22 DIAGNOSIS — E7800 Pure hypercholesterolemia, unspecified: Secondary | ICD-10-CM | POA: Diagnosis not present

## 2017-03-22 DIAGNOSIS — I2510 Atherosclerotic heart disease of native coronary artery without angina pectoris: Secondary | ICD-10-CM | POA: Diagnosis not present

## 2017-03-22 DIAGNOSIS — I714 Abdominal aortic aneurysm, without rupture: Secondary | ICD-10-CM | POA: Diagnosis not present

## 2017-03-22 DIAGNOSIS — I739 Peripheral vascular disease, unspecified: Secondary | ICD-10-CM | POA: Diagnosis not present

## 2017-03-22 DIAGNOSIS — Z01818 Encounter for other preprocedural examination: Secondary | ICD-10-CM | POA: Diagnosis not present

## 2017-03-22 DIAGNOSIS — I700 Atherosclerosis of aorta: Secondary | ICD-10-CM | POA: Diagnosis not present

## 2017-03-22 NOTE — Assessment & Plan Note (Signed)
Mr. Brazzel returns today for follow-up of his Doppler studies which were performed to/5/18. These did show a high-frequency signal in his distal right common iliac artery with monophasic waveforms in his common femoral and SFA. His left side was much less severely affected. His claudication is much worse on the right compared to left and he is limited by claudication. We will proceed with outpatient peripheral angiography potential endovascular therapy.

## 2017-03-22 NOTE — Addendum Note (Signed)
Addended by: Therisa Doyne on: 03/22/2017 09:13 AM   Modules accepted: Orders

## 2017-03-22 NOTE — Progress Notes (Signed)
03/22/2017 Manuel Friedman   10-24-1940  [id number]  Primary Physician Manuel Infante, MD Primary Cardiologist: Manuel Harp MD Manuel Friedman, [place]  HPI:  Manuel Friedman is a 77 y.o.  married Caucasian male father of 2, grandfather of 3 granddaughters referred by Dr. Stanford Friedman for evaluation treatment of symptomatic PAD. He does have a history of CAD status post catheterization gyri 2013 showing an occluded RCA with normal EF. His cardiac vascular resector profile is notable for essential hypertension and hyperlipidemia. He smoked remotely. He does have a scotch every evening which she enjoys. He complains of right much greater than the left lower extremity claudication which is lifestyle limiting over the last several years especially when walking up a hill. He does have a small abdominal aortic caries and measuring 3.6 cm both by duplex ultrasound and recent CTA. I performed duplex imaging on 02/22/17 revealing a high-grade distal right common leg artery stenosis with monophasic waveforms below and much less severe disease on the left. He does wish to proceed with peripheral angiography potential endovascular therapy.     Current Meds  Medication Sig  . aspirin EC 81 MG tablet Take 1 tablet (81 mg total) by mouth daily.  Marland Kitchen atorvastatin (LIPITOR) 80 MG tablet Take 1 tablet (80 mg total) by mouth daily.  . cetirizine (ZYRTEC) 10 MG tablet Take 10 mg by mouth as needed.  . Cholecalciferol (VITAMIN D3) 2000 UNITS capsule Take 2,000 Units by mouth daily.    . cilostazol (PLETAL) 50 MG tablet Take 1 tablet (50 mg total) 2 (two) times daily by mouth.  Marland Kitchen CINNAMON PO Take 2 tablets by mouth daily.   . Coenzyme Q10 (COQ-10 PO) Take 1 tablet by mouth daily.    Marland Kitchen escitalopram (LEXAPRO) 10 MG tablet Take 10 mg by mouth as needed.   . isosorbide mononitrate (IMDUR) 60 MG 24 hr tablet TAKE 1 TABLET EVERY DAY  . losartan (COZAAR) 50 MG tablet Take 25 mg by mouth daily.   Marland Kitchen zolpidem (AMBIEN)  5 MG tablet Take 5 mg by mouth at bedtime as needed.      No Known Allergies  Social History   Socioeconomic History  . Marital status: Married    Spouse name: Not on file  . Number of children: Not on file  . Years of education: Not on file  . Highest education level: Not on file  Social Needs  . Financial resource strain: Not on file  . Food insecurity - worry: Not on file  . Food insecurity - inability: Not on file  . Transportation needs - medical: Not on file  . Transportation needs - non-medical: Not on file  Occupational History  . Not on file  Tobacco Use  . Smoking status: Former Smoker    Last attempt to quit: 02/29/1992    Years since quitting: 25.0  . Smokeless tobacco: Never Used  Substance and Sexual Activity  . Alcohol use: Yes    Alcohol/week: 3.5 oz    Types: 7 drink(s) per week  . Drug use: No  . Sexual activity: Not on file  Other Topics Concern  . Not on file  Social History Narrative  . Not on file     Review of Systems: General: negative for chills, fever, night sweats or weight changes.  Cardiovascular: negative for chest pain, dyspnea on exertion, edema, orthopnea, palpitations, paroxysmal nocturnal dyspnea or shortness of breath Dermatological: negative for rash Respiratory: negative for cough or wheezing  Urologic: negative for hematuria Abdominal: negative for nausea, vomiting, diarrhea, bright red blood per rectum, melena, or hematemesis Neurologic: negative for visual changes, syncope, or dizziness All other systems reviewed and are otherwise negative except as noted above.    Blood pressure 128/70, pulse 90, height 6' (1.829 m), weight 206 lb (93.4 kg).  General appearance: alert and no distress Neck: no adenopathy, no carotid bruit, no JVD, supple, symmetrical, trachea midline and thyroid not enlarged, symmetric, no tenderness/mass/nodules Lungs: clear to auscultation bilaterally Heart: regular rate and rhythm, S1, S2 normal, no  murmur, click, rub or gallop Extremities: extremities normal, atraumatic, no cyanosis or edema Pulses: 1+ right, 2+ left pedal pulses Skin: Skin color, texture, turgor normal. No rashes or lesions Neurologic: Alert and oriented X 3, normal strength and tone. Normal symmetric reflexes. Normal coordination and gait  EKG not performed today  ASSESSMENT AND PLAN:   Peripheral arterial disease [facility]) [name] returns today for follow-up of his Doppler studies which were performed to/5/18. These did show a high-frequency signal in his distal right common iliac artery with monophasic waveforms in his common femoral and SFA. His left side was much less severely affected. His claudication is much worse on the right compared to left and he is limited by claudication. We will proceed with outpatient peripheral angiography potential endovascular therapy.      Manuel Harp MD FACP,FACC,FAHA, [facility] 03/22/2017 9:03 AM

## 2017-03-22 NOTE — Patient Instructions (Addendum)
   [facility] [address] [place] [address] Dept: [phone] Loc: [phone]  Manuel Friedman  03/22/2017  You are scheduled for a Peripheral Angiogram on Monday, January 7 with Dr. Quay Burow.  1. Please arrive at the [facility] (Main Entrance A) at [facility]: [address] [place], [place] [address] at 5:30 AM (two hours before your procedure to ensure your preparation). Free valet parking service is available.   Special note: Every effort is made to have your procedure done on time. Please understand that emergencies sometimes delay scheduled procedures.  2. Diet: Do not eat or drink anything after midnight prior to your procedure except sips of water to take medications.  3. Labs: Please have labs drawn in our office today.  --A chest x-ray (TODAY) takes a picture of the organs and structures inside the chest, including the heart, lungs, and blood vessels. This test can show several things, including, whether the heart is enlarges; whether fluid is building up in the lungs; and whether pacemaker / defibrillator leads are still in place.   4. Medication instructions in preparation for your procedure:  On the morning of your procedure, take your Aspirin and any morning medicines NOT listed above.  You may use sips of water.  5. Plan for one night stay--bring personal belongings. 6. Bring a current list of your medications and current insurance cards. 7. You MUST have a responsible person to drive you home. 8. Someone MUST be with you the first 24 hours after you arrive home or your discharge will be delayed. 9. Please wear clothes that are easy to get on and off and wear slip-on shoes.  Thank you for allowing [place] to care for you!   -- [facility]  Post-procedure follow-up:  1 WEEK AFTER PROCEDURE: Your physician has requested that  you have a lower extremity arterial duplex. During this test, ultrasound is used to evaluate arterial blood flow in the legs. Allow one hour for this exam. There are no restrictions or special instructions.  Your physician has requested that you have an ankle brachial index (ABI). During this test an ultrasound and blood pressure cuff are used to evaluate the arteries that supply the arms and legs with blood. Allow thirty minutes for this exam. There are no restrictions or special instructions.  Your physician recommends that you schedule a follow-up appointment in: 2 weeks post procedure with Dr. Gwenlyn Found.

## 2017-03-22 NOTE — H&P (View-Only) (Signed)
03/22/2017 Manuel Friedman   July 12, 1940  [id number]  Primary Physician [name], MD Primary Cardiologist: Lorretta Harp MD Lupe Carney, [place]  HPI:  Manuel Friedman is a 77 y.o.  married Caucasian male father of 2, grandfather of 3 granddaughters referred by Dr. Stanford Breed for evaluation treatment of symptomatic PAD. He does have a history of CAD status post catheterization gyri 2013 showing an occluded RCA with normal EF. His cardiac vascular resector profile is notable for essential hypertension and hyperlipidemia. He smoked remotely. He does have a scotch every evening which she enjoys. He complains of right much greater than the left lower extremity claudication which is lifestyle limiting over the last several years especially when walking up a hill. He does have a small abdominal aortic caries and measuring 3.6 cm both by duplex ultrasound and recent CTA. I performed duplex imaging on 02/22/17 revealing a high-grade distal right common leg artery stenosis with monophasic waveforms below and much less severe disease on the left. He does wish to proceed with peripheral angiography potential endovascular therapy.     Current Meds  Medication Sig  . aspirin EC 81 MG tablet Take 1 tablet (81 mg total) by mouth daily.  Marland Kitchen atorvastatin (LIPITOR) 80 MG tablet Take 1 tablet (80 mg total) by mouth daily.  . cetirizine (ZYRTEC) 10 MG tablet Take 10 mg by mouth as needed.  . Cholecalciferol (VITAMIN D3) 2000 UNITS capsule Take 2,000 Units by mouth daily.    . cilostazol (PLETAL) 50 MG tablet Take 1 tablet (50 mg total) 2 (two) times daily by mouth.  Marland Kitchen CINNAMON PO Take 2 tablets by mouth daily.   . Coenzyme Q10 (COQ-10 PO) Take 1 tablet by mouth daily.    Marland Kitchen escitalopram (LEXAPRO) 10 MG tablet Take 10 mg by mouth as needed.   . isosorbide mononitrate (IMDUR) 60 MG 24 hr tablet TAKE 1 TABLET EVERY DAY  . losartan (COZAAR) 50 MG tablet Take 25 mg by mouth daily.   Marland Kitchen zolpidem (AMBIEN)  5 MG tablet Take 5 mg by mouth at bedtime as needed.      No Known Allergies  Social History   Socioeconomic History  . Marital status: Married    Spouse name: Not on file  . Number of children: Not on file  . Years of education: Not on file  . Highest education level: Not on file  Social Needs  . Financial resource strain: Not on file  . Food insecurity - worry: Not on file  . Food insecurity - inability: Not on file  . Transportation needs - medical: Not on file  . Transportation needs - non-medical: Not on file  Occupational History  . Not on file  Tobacco Use  . Smoking status: Former Smoker    Last attempt to quit: 02/29/1992    Years since quitting: 25.0  . Smokeless tobacco: Never Used  Substance and Sexual Activity  . Alcohol use: Yes    Alcohol/week: 3.5 oz    Types: 7 drink(s) per week  . Drug use: No  . Sexual activity: Not on file  Other Topics Concern  . Not on file  Social History Narrative  . Not on file     Review of Systems: General: negative for chills, fever, night sweats or weight changes.  Cardiovascular: negative for chest pain, dyspnea on exertion, edema, orthopnea, palpitations, paroxysmal nocturnal dyspnea or shortness of breath Dermatological: negative for rash Respiratory: negative for cough or wheezing  Urologic: negative for hematuria Abdominal: negative for nausea, vomiting, diarrhea, bright red blood per rectum, melena, or hematemesis Neurologic: negative for visual changes, syncope, or dizziness All other systems reviewed and are otherwise negative except as noted above.    Blood pressure 128/70, pulse 90, height 6' (1.829 m), weight 206 lb (93.4 kg).  General appearance: alert and no distress Neck: no adenopathy, no carotid bruit, no JVD, supple, symmetrical, trachea midline and thyroid not enlarged, symmetric, no tenderness/mass/nodules Lungs: clear to auscultation bilaterally Heart: regular rate and rhythm, S1, S2 normal, no  murmur, click, rub or gallop Extremities: extremities normal, atraumatic, no cyanosis or edema Pulses: 1+ right, 2+ left pedal pulses Skin: Skin color, texture, turgor normal. No rashes or lesions Neurologic: Alert and oriented X 3, normal strength and tone. Normal symmetric reflexes. Normal coordination and gait  EKG not performed today  ASSESSMENT AND PLAN:   Peripheral arterial disease [facility]) Mr. Shaul returns today for follow-up of his Doppler studies which were performed to/5/18. These did show a high-frequency signal in his distal right common iliac artery with monophasic waveforms in his common femoral and SFA. His left side was much less severely affected. His claudication is much worse on the right compared to left and he is limited by claudication. We will proceed with outpatient peripheral angiography potential endovascular therapy.      Lorretta Harp MD FACP,FACC,FAHA, [facility] 03/22/2017 9:03 AM

## 2017-03-23 LAB — BASIC METABOLIC PANEL
BUN / CREAT RATIO: 16 (ref 10–24)
BUN: 19 mg/dL (ref 8–27)
CO2: 21 mmol/L (ref 20–29)
CREATININE: 1.19 mg/dL (ref 0.76–1.27)
Calcium: 9.7 mg/dL (ref 8.6–10.2)
Chloride: 106 mmol/L (ref 96–106)
GFR calc Af Amer: 67 mL/min/{1.73_m2} (ref >59)
GFR, EST NON AFRICAN AMERICAN: 58 mL/min/{1.73_m2} — AB (ref >59)
GLUCOSE: 103 mg/dL — AB (ref 65–99)
Potassium: 4.7 mmol/L (ref 3.5–5.2)
SODIUM: 143 mmol/L (ref 134–144)

## 2017-03-23 LAB — CBC WITH DIFFERENTIAL/PLATELET
BASOS: 1 %
Basophils Absolute: 0 10*3/uL (ref 0.0–0.2)
EOS (ABSOLUTE): 0.1 10*3/uL (ref 0.0–0.4)
EOS: 3 %
HEMATOCRIT: 40 % (ref 37.5–51.0)
Hemoglobin: 14.1 g/dL (ref 13.0–17.7)
IMMATURE GRANULOCYTES: 0 %
Immature Grans (Abs): 0 10*3/uL (ref 0.0–0.1)
Lymphocytes Absolute: 1.7 10*3/uL (ref 0.7–3.1)
Lymphs: 34 %
MCH: 31.8 pg (ref 26.6–33.0)
MCHC: 35.3 g/dL (ref 31.5–35.7)
MCV: 90 fL (ref 79–97)
MONOCYTES: 9 %
MONOS ABS: 0.4 10*3/uL (ref 0.1–0.9)
NEUTROS PCT: 53 %
Neutrophils Absolute: 2.7 10*3/uL (ref 1.4–7.0)
Platelets: 226 10*3/uL (ref 150–379)
RBC: 4.43 x10E6/uL (ref 4.14–5.80)
RDW: 12.5 % (ref 12.3–15.4)
WBC: 5 10*3/uL (ref 3.4–10.8)

## 2017-03-23 LAB — TSH: TSH: 2.13 u[IU]/mL (ref 0.450–4.500)

## 2017-03-23 LAB — PROTIME-INR
INR: 1 (ref 0.8–1.2)
PROTHROMBIN TIME: 10.7 s (ref 9.1–12.0)

## 2017-03-23 LAB — APTT: APTT: 28 s (ref 24–33)

## 2017-03-24 ENCOUNTER — Other Ambulatory Visit: Payer: Self-pay | Admitting: Cardiovascular Disease

## 2017-03-24 ENCOUNTER — Telehealth: Payer: Self-pay | Admitting: Cardiovascular Disease

## 2017-03-24 DIAGNOSIS — I739 Peripheral vascular disease, unspecified: Secondary | ICD-10-CM

## 2017-03-24 DIAGNOSIS — E7800 Pure hypercholesterolemia, unspecified: Secondary | ICD-10-CM

## 2017-03-24 MED ORDER — ATORVASTATIN CALCIUM 80 MG PO TABS: 80.0000 mg | ORAL_TABLET | Freq: Every day | ORAL | 3 refills | Status: DC

## 2017-03-24 NOTE — Telephone Encounter (Signed)
Received call from [facility] pharmacist with [facility] calling to ask if ok to refill atorvastatin.Stated patient listed Lipitor as a allergy. Spoke to patient he stated he is not allergic Lipitor.Advised I will send in refill.

## 2017-03-24 NOTE — Telephone Encounter (Signed)
Pt c/o medication issue:  1. Name of Medication: atorvastatin (LIPITOR) 80 MG tablet  2. How are you currently taking this medication (dosage and times per day)? Take 1 tablet (80 mg total) by mouth daily.  3. Are you having a reaction (difficulty breathing--STAT)?   4. What is your medication issue? Manuel Friedman needs clarification on the medication

## 2017-03-27 ENCOUNTER — Telehealth: Payer: Self-pay | Admitting: Vascular Surgery

## 2017-03-27 ENCOUNTER — Ambulatory Visit (HOSPITAL_COMMUNITY)
Admission: RE | Admit: 2017-03-27 | Discharge: 2017-03-27 | Disposition: A | Discharge status: Home / Self Care | Payer: Medicare HMO | Source: Ambulatory Visit | Attending: Cardiovascular Disease | Admitting: Cardiovascular Disease

## 2017-03-27 ENCOUNTER — Encounter (HOSPITAL_COMMUNITY)
Admission: RE | Disposition: A | Discharge status: Home / Self Care | Payer: Self-pay | Source: Ambulatory Visit | Attending: Cardiovascular Disease

## 2017-03-27 DIAGNOSIS — Z87891 Personal history of nicotine dependence: Secondary | ICD-10-CM | POA: Insufficient documentation

## 2017-03-27 DIAGNOSIS — Z79899 Other long term (current) drug therapy: Secondary | ICD-10-CM | POA: Diagnosis not present

## 2017-03-27 DIAGNOSIS — I70213 Atherosclerosis of native arteries of extremities with intermittent claudication, bilateral legs: Secondary | ICD-10-CM | POA: Diagnosis not present

## 2017-03-27 DIAGNOSIS — I714 Abdominal aortic aneurysm, without rupture: Secondary | ICD-10-CM | POA: Insufficient documentation

## 2017-03-27 DIAGNOSIS — E785 Hyperlipidemia, unspecified: Secondary | ICD-10-CM | POA: Insufficient documentation

## 2017-03-27 DIAGNOSIS — I2510 Atherosclerotic heart disease of native coronary artery without angina pectoris: Secondary | ICD-10-CM | POA: Diagnosis not present

## 2017-03-27 DIAGNOSIS — I7092 Chronic total occlusion of artery of the extremities: Secondary | ICD-10-CM | POA: Insufficient documentation

## 2017-03-27 DIAGNOSIS — I70211 Atherosclerosis of native arteries of extremities with intermittent claudication, right leg: Secondary | ICD-10-CM | POA: Insufficient documentation

## 2017-03-27 DIAGNOSIS — I10 Essential (primary) hypertension: Secondary | ICD-10-CM | POA: Diagnosis not present

## 2017-03-27 DIAGNOSIS — Z7982 Long term (current) use of aspirin: Secondary | ICD-10-CM | POA: Insufficient documentation

## 2017-03-27 DIAGNOSIS — I739 Peripheral vascular disease, unspecified: Secondary | ICD-10-CM | POA: Diagnosis present

## 2017-03-27 HISTORY — PX: LOWER EXTREMITY ANGIOGRAPHY: CATH118251

## 2017-03-27 SURGERY — LOWER EXTREMITY ANGIOGRAPHY
Anesthesia: LOCAL

## 2017-03-27 MED ORDER — SODIUM CHLORIDE 0.9% FLUSH: 3.0000 mL | INTRAVENOUS | Status: DC | PRN

## 2017-03-27 MED ORDER — ACETAMINOPHEN 325 MG PO TABS: 650.0000 mg | ORAL_TABLET | ORAL | Status: DC | PRN

## 2017-03-27 MED ORDER — SODIUM CHLORIDE 0.9 % IV SOLN: INTRAVENOUS | Status: DC

## 2017-03-27 MED ORDER — ASPIRIN 81 MG PO CHEW
CHEWABLE_TABLET | ORAL | Status: AC
  Administered 2017-03-27: 81 mg via ORAL
  Filled 2017-03-27: qty 1

## 2017-03-27 MED ORDER — SODIUM CHLORIDE 0.9 % IV SOLN: 250.0000 mL | INTRAVENOUS | Status: DC | PRN

## 2017-03-27 MED ORDER — SODIUM CHLORIDE 0.9 % WEIGHT BASED INFUSION: 1.0000 mL/kg/h | INTRAVENOUS | Status: DC

## 2017-03-27 MED ORDER — FENTANYL CITRATE (PF) 100 MCG/2ML IJ SOLN
INTRAMUSCULAR | Status: AC
  Filled 2017-03-27: qty 2

## 2017-03-27 MED ORDER — ONDANSETRON HCL 4 MG/2ML IJ SOLN: 4.0000 mg | Freq: Four times a day (QID) | INTRAMUSCULAR | Status: DC | PRN

## 2017-03-27 MED ORDER — FENTANYL CITRATE (PF) 100 MCG/2ML IJ SOLN
INTRAMUSCULAR | Status: DC | PRN
  Administered 2017-03-27: 25 ug via INTRAVENOUS

## 2017-03-27 MED ORDER — LABETALOL HCL 5 MG/ML IV SOLN: 10.0000 mg | INTRAVENOUS | Status: DC | PRN

## 2017-03-27 MED ORDER — HEPARIN (PORCINE) IN NACL 2-0.9 UNIT/ML-% IJ SOLN
INTRAMUSCULAR | Status: AC
  Filled 2017-03-27: qty 1000

## 2017-03-27 MED ORDER — HYDRALAZINE HCL 20 MG/ML IJ SOLN: 5.0000 mg | INTRAMUSCULAR | Status: DC | PRN

## 2017-03-27 MED ORDER — MORPHINE SULFATE (PF) 10 MG/ML IV SOLN: 2.0000 mg | INTRAVENOUS | Status: DC | PRN

## 2017-03-27 MED ORDER — SODIUM CHLORIDE 0.9% FLUSH: 3.0000 mL | Freq: Two times a day (BID) | INTRAVENOUS | Status: DC

## 2017-03-27 MED ORDER — ASPIRIN EC 81 MG PO TBEC
81.0000 mg | DELAYED_RELEASE_TABLET | Freq: Every day | ORAL | Status: DC
  Filled 2017-03-27: qty 1

## 2017-03-27 MED ORDER — MIDAZOLAM HCL 2 MG/2ML IJ SOLN
INTRAMUSCULAR | Status: AC
  Filled 2017-03-27: qty 2

## 2017-03-27 MED ORDER — ASPIRIN 81 MG PO CHEW
81.0000 mg | CHEWABLE_TABLET | ORAL | Status: AC
  Administered 2017-03-27: 81 mg via ORAL

## 2017-03-27 MED ORDER — LIDOCAINE HCL (PF) 1 % IJ SOLN
INTRAMUSCULAR | Status: AC
  Filled 2017-03-27: qty 30

## 2017-03-27 MED ORDER — SODIUM CHLORIDE 0.9 % WEIGHT BASED INFUSION
3.0000 mL/kg/h | INTRAVENOUS | Status: AC
  Administered 2017-03-27: 3 mL/kg/h via INTRAVENOUS

## 2017-03-27 MED ORDER — HEPARIN (PORCINE) IN NACL 2-0.9 UNIT/ML-% IJ SOLN
INTRAMUSCULAR | Status: AC | PRN
  Administered 2017-03-27: 1000 mL via INTRA_ARTERIAL

## 2017-03-27 MED ORDER — ASPIRIN 81 MG PO CHEW: 81.0000 mg | CHEWABLE_TABLET | Freq: Every day | ORAL | Status: DC

## 2017-03-27 MED ORDER — ATORVASTATIN CALCIUM 80 MG PO TABS
80.0000 mg | ORAL_TABLET | Freq: Every day | ORAL | Status: DC
  Filled 2017-03-27: qty 1

## 2017-03-27 MED ORDER — LIDOCAINE HCL (PF) 1 % IJ SOLN
INTRAMUSCULAR | Status: DC | PRN
  Administered 2017-03-27: 30 mL via INTRADERMAL

## 2017-03-27 MED ORDER — IODIXANOL 320 MG/ML IV SOLN
INTRAVENOUS | Status: DC | PRN
  Administered 2017-03-27: 130 mL via INTRA_ARTERIAL

## 2017-03-27 MED ORDER — MIDAZOLAM HCL 2 MG/2ML IJ SOLN
INTRAMUSCULAR | Status: DC | PRN
  Administered 2017-03-27: 1 mg via INTRAVENOUS

## 2017-03-27 SURGICAL SUPPLY — 13 items
CATH ANGIO 5F PIGTAIL 65CM (CATHETERS) ×2 IMPLANT
CATH SOFT-VU 4F 65 STRAIGHT (CATHETERS) ×1 IMPLANT
CATH SOFT-VU STRAIGHT 4F 65CM (CATHETERS) ×1
CATH TEMPO 5F RIM 65CM (CATHETERS) ×2 IMPLANT
GUIDEWIRE ANGLED .035X150CM (WIRE) ×2 IMPLANT
KIT PV (KITS) ×2 IMPLANT
SHEATH PINNACLE 5F 10CM (SHEATH) ×2 IMPLANT
STOPCOCK MORSE 400PSI 3WAY (MISCELLANEOUS) ×2 IMPLANT
SYRINGE MEDRAD AVANTA MACH 7 (SYRINGE) ×2 IMPLANT
TRANSDUCER W/STOPCOCK (MISCELLANEOUS) ×2 IMPLANT
TRAY PV CATH (CUSTOM PROCEDURE TRAY) ×2 IMPLANT
TUBING CIL FLEX 10 FLL-RA (TUBING) ×2 IMPLANT
WIRE HITORQ VERSACORE ST 145CM (WIRE) ×2 IMPLANT

## 2017-03-27 NOTE — Interval H&P Note (Signed)
History and Physical Interval Note:  03/27/2017 8:17 AM  Manuel Friedman  has presented today for surgery, with the diagnosis of Claudication  The various methods of treatment have been discussed with the patient and family. After consideration of risks, benefits and other options for treatment, the patient has consented to  Procedure(s): LOWER EXTREMITY ANGIOGRAPHY (N/A) as a surgical intervention .  The patient's history has been reviewed, patient examined, no change in status, stable for surgery.  I have reviewed the patient's chart and labs.  Questions were answered to the patient's satisfaction.     Quay Burow

## 2017-03-27 NOTE — Progress Notes (Signed)
Site area: left groin fa sheath Site Prior to Removal:  Level 0 Pressure Applied For:  20 minutes Manual:   yes Patient Status During Pull:  stable Post Pull Site:  Level  0 Post Pull Instructions Given:  yes Post Pull Pulses Present: left PT 2+ palpable Dressing Applied:  Gauze and tegaderm Bedrest begins @ 0930 Comments:

## 2017-03-27 NOTE — Telephone Encounter (Signed)
-----   Message from Mena Goes, RN sent at 03/27/2017 12:31 PM EST ----- Regarding: FW: office visit   ----- Message ----- From: Angelia Mould, MD Sent: 03/27/2017  11:30 AM To: [facility] Charge Pool Subject: office visit                                   Dr. Quay Burow has asked me to see this patient as an outpatient.  He has a right common femoral artery occlusion.  I can see him as an add-on to my schedule this Wednesday at 4:15 (ie, at the end of the day) unless I have a spot the following Wednesday. Thank you CD

## 2017-03-27 NOTE — Discharge Instructions (Signed)

## 2017-03-27 NOTE — Telephone Encounter (Signed)
Sched appt 03/29/17 at 4:15. Lm on hm# to give pt appt and office address and ph#.

## 2017-03-28 ENCOUNTER — Encounter (HOSPITAL_COMMUNITY): Payer: Self-pay | Admitting: Cardiovascular Disease

## 2017-03-28 ENCOUNTER — Other Ambulatory Visit: Payer: Self-pay | Admitting: Cardiovascular Disease

## 2017-03-29 ENCOUNTER — Ambulatory Visit: Payer: Medicare HMO | Admitting: Vascular Surgery

## 2017-03-29 ENCOUNTER — Encounter: Payer: Self-pay | Admitting: Vascular Surgery

## 2017-03-29 ENCOUNTER — Other Ambulatory Visit: Payer: Self-pay | Admitting: *Deleted

## 2017-03-29 ENCOUNTER — Encounter: Payer: Medicare HMO | Admitting: Vascular Surgery

## 2017-03-29 ENCOUNTER — Encounter: Payer: Self-pay | Admitting: *Deleted

## 2017-03-29 VITALS — BP 128/77 | HR 91 | Temp 97.0°F | Resp 16 | Ht 72.0 in | Wt 205.0 lb

## 2017-03-29 DIAGNOSIS — I739 Peripheral vascular disease, unspecified: Secondary | ICD-10-CM | POA: Diagnosis not present

## 2017-03-29 DIAGNOSIS — I714 Abdominal aortic aneurysm, without rupture, unspecified: Secondary | ICD-10-CM

## 2017-03-29 NOTE — Progress Notes (Signed)
Patient name: Manuel Friedman MRN: [id number] DOB: 11/02/1940 Sex: male   REASON FOR CONSULT:    Right common femoral artery occlusion.  The consult is requested by Dr. Quay Burow.  HPI:   Manuel Friedman is a pleasant 77 y.o. male, presents with a 3-year history of bilateral lower extremity claudication.  His symptoms are more significant on the right side.  He experiences pain in both calves which is brought on by ambulation and relieved with rest.  This occurs at approximately 50 yards.  His symptoms have gradually progressed over the last year and have become especially disabling in the last month.  He denies any thigh or hip claudication.  He denies any history of rest pain.  His risk factors for peripheral vascular disease include hypertension and hypercholesterolemia.  Quit tobacco 30 years ago.  He denies any history of diabetes.  He denies any family history of premature cardiovascular disease.  He has a small abdominal aortic aneurysm.  He denies any abdominal pain.  He has had some chronic low back pain which is stable.  Past Medical History:  Diagnosis Date  . Adenomatous colon polyp   . Arthritis   . CAD (coronary artery disease)    s/p cath 03/2011 with total RCA, left to right collaterals and mild nonobstructive disease in the LAD and LCX. Trying medical management but could attempt PCI of the RCA  . COPD (chronic obstructive pulmonary disease) ([place])   . Decreased hearing   . Depression   . Diverticulosis   . Esophageal stricture   . Hiatal hernia   . HTN (hypertension)   . Hyperlipemia   . Internal and external hemorrhoids without complication   . Lung nodule    Noted on CTA Jan 2013. Needs follow upstudy  . Rosacea     Family History  Problem Relation Age of Onset  . Pancreatic cancer Mother   . Multiple myeloma Father   . Colon cancer Neg Hx   . Stomach cancer Neg Hx     SOCIAL HISTORY: Social History   Socioeconomic History  . Marital status:  Married    Spouse name: Not on file  . Number of children: Not on file  . Years of education: Not on file  . Highest education level: Not on file  Social Needs  . Financial resource strain: Not on file  . Food insecurity - worry: Not on file  . Food insecurity - inability: Not on file  . Transportation needs - medical: Not on file  . Transportation needs - non-medical: Not on file  Occupational History  . Not on file  Tobacco Use  . Smoking status: Former Smoker    Last attempt to quit: 02/29/1992    Years since quitting: 25.0  . Smokeless tobacco: Never Used  Substance and Sexual Activity  . Alcohol use: Yes    Alcohol/week: 3.5 oz    Types: 7 drink(s) per week  . Drug use: No  . Sexual activity: Not on file  Other Topics Concern  . Not on file  Social History Narrative  . Not on file    No Known Allergies  Current Outpatient Medications  Medication Sig Dispense Refill  . aspirin EC 81 MG tablet Take 1 tablet (81 mg total) by mouth daily. 150 tablet 2  . atorvastatin (LIPITOR) 80 MG tablet Take 1 tablet (80 mg total) by mouth daily. 90 tablet 3  . cetirizine (ZYRTEC) 10 MG tablet Take 10 mg  by mouth as needed for allergies.     . Cholecalciferol (VITAMIN D3) 2000 UNITS capsule Take 2,000 Units by mouth daily.      . cilostazol (PLETAL) 50 MG tablet TAKE 1 TABLET TWICE DAILY 90 tablet 3  . CINNAMON PO Take 1 tablet by mouth daily.     . Coenzyme Q10 (COQ-10 PO) Take 1 tablet by mouth daily.      Marland Kitchen escitalopram (LEXAPRO) 10 MG tablet Take 10 mg by mouth daily.     Marland Kitchen ibuprofen (ADVIL,MOTRIN) 200 MG tablet Take 400 mg by mouth daily as needed for moderate pain.    . isosorbide mononitrate (IMDUR) 60 MG 24 hr tablet TAKE 1 TABLET EVERY DAY 90 tablet 3  . losartan (COZAAR) 50 MG tablet Take 25 mg by mouth daily.     . Multiple Vitamin (MULTIVITAMIN WITH MINERALS) TABS tablet Take 1 tablet by mouth daily.    . naproxen sodium (ALEVE) 220 MG tablet Take 220 mg by mouth daily as  needed (pain).    Marland Kitchen zolpidem (AMBIEN) 5 MG tablet Take 5 mg by mouth at bedtime.      No current facility-administered medications for this visit.    Facility-Administered Medications Ordered in Other Visits  Medication Dose Route Frequency Provider Last Rate Last Dose  . 0.9 %  sodium chloride infusion  250 mL Intravenous PRN Lelon Perla, MD      . sodium chloride 0.9 % injection 3 mL  3 mL Intravenous Q12H Stanford Breed, Denice Bors, MD      . sodium chloride 0.9 % injection 3 mL  3 mL Intravenous PRN Stanford Breed, Denice Bors, MD        REVIEW OF SYSTEMS:  _0  denotes positive finding, _1  denotes negative finding Cardiac  Comments:  Chest pain or chest pressure:    Shortness of breath upon exertion:    Short of breath when lying flat:    Irregular heart rhythm:        Vascular    Pain in calf, thigh, or hip brought on by ambulation: X   Pain in feet at night that wakes you up from your sleep:     Blood clot in your veins:    Leg swelling:         Pulmonary    Oxygen at home:    Productive cough:     Wheezing:         Neurologic    Sudden weakness in arms or legs:     Sudden numbness in arms or legs:     Sudden onset of difficulty speaking or slurred speech:    Temporary loss of vision in one eye:     Problems with dizziness:         Gastrointestinal    Blood in stool:     Vomited blood:         Genitourinary    Burning when urinating:     Blood in urine:        Psychiatric    Major depression:         Hematologic    Bleeding problems:    Problems with blood clotting too easily:        Skin    Rashes or ulcers:        Constitutional    Fever or chills:     PHYSICAL EXAM:   Vitals:   03/29/17 1438  BP: 128/77  Pulse: 91  Resp: 16  Temp: (!)  53 F (36.1 C)  TempSrc: Oral  SpO2: 97%  Weight: 205 lb (93 kg)  Height: 6' (1.829 m)    GENERAL: The patient is a well-nourished male, in no acute distress. The vital signs are documented above. CARDIAC: There is  a regular rate and rhythm.  VASCULAR: I do not detect carotid bruits. On the right side, I can palpate a pulse above the occlusion.  I cannot palpate a popliteal or pedal pulses. On the left side he has a palpable femoral, popliteal, and posterior tibial pulse.  I cannot palpate a dorsalis pedis pulse on the left. PULMONARY: There is good air exchange bilaterally without wheezing or rales. ABDOMEN: Soft and non-tender with normal pitched bowel sounds.  I cannot palpate his abdominal aortic aneurysm. MUSCULOSKELETAL: There are no major deformities or cyanosis. NEUROLOGIC: No focal weakness or paresthesias are detected. SKIN: There are no ulcers or rashes noted. PSYCHIATRIC: The patient has a normal affect.  DATA:    ANGIOGRAM: I reviewed the angiogram that was done 2 days ago by Dr. Quay Burow.  This shows that he has no significant aortoiliac occlusive disease.  He has a severely diseased calcific plaque in the right common femoral artery extending down into the proximal superficial femoral artery and deep femoral artery.  He has good runoff below that.  I reviewed his previous CT scan from October 2018 which shows severe calcific disease in the common femoral artery.  The deep femoral artery is poorly visualized.  MEDICAL ISSUES:   DISABLING RIGHT LOWER EXTREMITY CLAUDICATION: This patient has disabling right lower extremity claudication related to a common femoral artery occlusion of the on the right.  He is not a candidate for endovascular approach.  I think he is a good candidate for endarterectomy and vein patch angioplasty of the right common femoral artery.  This does appear to extend down into the proximal superficial femoral artery so getting a good proximal distal endpoint is oftentimes challenging on these cases.  I have discussed the indications for the procedure and the potential complications with the patient and he is agreeable to proceed.  His surgery is scheduled for  04/06/2017.  3.3 CM INFRARENAL ABDOMINAL AORTIC ANEURYSM: This patient has a very small infrarenal aneurysm.  He understands we would not consider elective repair unless it reached 5.5 cm in maximum diameter.  I will discussed this with Dr. Quay Burow but I suspect that he will be the one following this.  Deitra Mayo [facility] [phone]

## 2017-03-29 NOTE — H&P (View-Only) (Signed)
Patient name: Manuel Friedman MRN: [id number] DOB: 11/02/1940 Sex: male   REASON FOR CONSULT:    Right common femoral artery occlusion.  The consult is requested by Dr. Quay Burow.  HPI:   Manuel Friedman is a pleasant 77 y.o. male, presents with a 3-year history of bilateral lower extremity claudication.  His symptoms are more significant on the right side.  He experiences pain in both calves which is brought on by ambulation and relieved with rest.  This occurs at approximately 50 yards.  His symptoms have gradually progressed over the last year and have become especially disabling in the last month.  He denies any thigh or hip claudication.  He denies any history of rest pain.  His risk factors for peripheral vascular disease include hypertension and hypercholesterolemia.  Quit tobacco 30 years ago.  He denies any history of diabetes.  He denies any family history of premature cardiovascular disease.  He has a small abdominal aortic aneurysm.  He denies any abdominal pain.  He has had some chronic low back pain which is stable.  Past Medical History:  Diagnosis Date  . Adenomatous colon polyp   . Arthritis   . CAD (coronary artery disease)    s/p cath 03/2011 with total RCA, left to right collaterals and mild nonobstructive disease in the LAD and LCX. Trying medical management but could attempt PCI of the RCA  . COPD (chronic obstructive pulmonary disease) ([place])   . Decreased hearing   . Depression   . Diverticulosis   . Esophageal stricture   . Hiatal hernia   . HTN (hypertension)   . Hyperlipemia   . Internal and external hemorrhoids without complication   . Lung nodule    Noted on CTA Jan 2013. Needs follow upstudy  . Rosacea     Family History  Problem Relation Age of Onset  . Pancreatic cancer Mother   . Multiple myeloma Father   . Colon cancer Neg Hx   . Stomach cancer Neg Hx     SOCIAL HISTORY: Social History   Socioeconomic History  . Marital status:  Married    Spouse name: Not on file  . Number of children: Not on file  . Years of education: Not on file  . Highest education level: Not on file  Social Needs  . Financial resource strain: Not on file  . Food insecurity - worry: Not on file  . Food insecurity - inability: Not on file  . Transportation needs - medical: Not on file  . Transportation needs - non-medical: Not on file  Occupational History  . Not on file  Tobacco Use  . Smoking status: Former Smoker    Last attempt to quit: 02/29/1992    Years since quitting: 25.0  . Smokeless tobacco: Never Used  Substance and Sexual Activity  . Alcohol use: Yes    Alcohol/week: 3.5 oz    Types: 7 drink(s) per week  . Drug use: No  . Sexual activity: Not on file  Other Topics Concern  . Not on file  Social History Narrative  . Not on file    No Known Allergies  Current Outpatient Medications  Medication Sig Dispense Refill  . aspirin EC 81 MG tablet Take 1 tablet (81 mg total) by mouth daily. 150 tablet 2  . atorvastatin (LIPITOR) 80 MG tablet Take 1 tablet (80 mg total) by mouth daily. 90 tablet 3  . cetirizine (ZYRTEC) 10 MG tablet Take 10 mg  by mouth as needed for allergies.     . Cholecalciferol (VITAMIN D3) 2000 UNITS capsule Take 2,000 Units by mouth daily.      . cilostazol (PLETAL) 50 MG tablet TAKE 1 TABLET TWICE DAILY 90 tablet 3  . CINNAMON PO Take 1 tablet by mouth daily.     . Coenzyme Q10 (COQ-10 PO) Take 1 tablet by mouth daily.      Marland Kitchen escitalopram (LEXAPRO) 10 MG tablet Take 10 mg by mouth daily.     Marland Kitchen ibuprofen (ADVIL,MOTRIN) 200 MG tablet Take 400 mg by mouth daily as needed for moderate pain.    . isosorbide mononitrate (IMDUR) 60 MG 24 hr tablet TAKE 1 TABLET EVERY DAY 90 tablet 3  . losartan (COZAAR) 50 MG tablet Take 25 mg by mouth daily.     . Multiple Vitamin (MULTIVITAMIN WITH MINERALS) TABS tablet Take 1 tablet by mouth daily.    . naproxen sodium (ALEVE) 220 MG tablet Take 220 mg by mouth daily as  needed (pain).    Marland Kitchen zolpidem (AMBIEN) 5 MG tablet Take 5 mg by mouth at bedtime.      No current facility-administered medications for this visit.    Facility-Administered Medications Ordered in Other Visits  Medication Dose Route Frequency Provider Last Rate Last Dose  . 0.9 %  sodium chloride infusion  250 mL Intravenous PRN Lelon Perla, MD      . sodium chloride 0.9 % injection 3 mL  3 mL Intravenous Q12H Stanford Breed, Denice Bors, MD      . sodium chloride 0.9 % injection 3 mL  3 mL Intravenous PRN Stanford Breed, Denice Bors, MD        REVIEW OF SYSTEMS:  _0  denotes positive finding, _1  denotes negative finding Cardiac  Comments:  Chest pain or chest pressure:    Shortness of breath upon exertion:    Short of breath when lying flat:    Irregular heart rhythm:        Vascular    Pain in calf, thigh, or hip brought on by ambulation: X   Pain in feet at night that wakes you up from your sleep:     Blood clot in your veins:    Leg swelling:         Pulmonary    Oxygen at home:    Productive cough:     Wheezing:         Neurologic    Sudden weakness in arms or legs:     Sudden numbness in arms or legs:     Sudden onset of difficulty speaking or slurred speech:    Temporary loss of vision in one eye:     Problems with dizziness:         Gastrointestinal    Blood in stool:     Vomited blood:         Genitourinary    Burning when urinating:     Blood in urine:        Psychiatric    Major depression:         Hematologic    Bleeding problems:    Problems with blood clotting too easily:        Skin    Rashes or ulcers:        Constitutional    Fever or chills:     PHYSICAL EXAM:   Vitals:   03/29/17 1438  BP: 128/77  Pulse: 91  Resp: 16  Temp: (!)  53 F (36.1 C)  TempSrc: Oral  SpO2: 97%  Weight: 205 lb (93 kg)  Height: 6' (1.829 m)    GENERAL: The patient is a well-nourished male, in no acute distress. The vital signs are documented above. CARDIAC: There is  a regular rate and rhythm.  VASCULAR: I do not detect carotid bruits. On the right side, I can palpate a pulse above the occlusion.  I cannot palpate a popliteal or pedal pulses. On the left side he has a palpable femoral, popliteal, and posterior tibial pulse.  I cannot palpate a dorsalis pedis pulse on the left. PULMONARY: There is good air exchange bilaterally without wheezing or rales. ABDOMEN: Soft and non-tender with normal pitched bowel sounds.  I cannot palpate his abdominal aortic aneurysm. MUSCULOSKELETAL: There are no major deformities or cyanosis. NEUROLOGIC: No focal weakness or paresthesias are detected. SKIN: There are no ulcers or rashes noted. PSYCHIATRIC: The patient has a normal affect.  DATA:    ANGIOGRAM: I reviewed the angiogram that was done 2 days ago by Dr. Quay Burow.  This shows that he has no significant aortoiliac occlusive disease.  He has a severely diseased calcific plaque in the right common femoral artery extending down into the proximal superficial femoral artery and deep femoral artery.  He has good runoff below that.  I reviewed his previous CT scan from October 2018 which shows severe calcific disease in the common femoral artery.  The deep femoral artery is poorly visualized.  MEDICAL ISSUES:   DISABLING RIGHT LOWER EXTREMITY CLAUDICATION: This patient has disabling right lower extremity claudication related to a common femoral artery occlusion of the on the right.  He is not a candidate for endovascular approach.  I think he is a good candidate for endarterectomy and vein patch angioplasty of the right common femoral artery.  This does appear to extend down into the proximal superficial femoral artery so getting a good proximal distal endpoint is oftentimes challenging on these cases.  I have discussed the indications for the procedure and the potential complications with the patient and he is agreeable to proceed.  His surgery is scheduled for  04/06/2017.  3.3 CM INFRARENAL ABDOMINAL AORTIC ANEURYSM: This patient has a very small infrarenal aneurysm.  He understands we would not consider elective repair unless it reached 5.5 cm in maximum diameter.  I will discussed this with Dr. Quay Burow but I suspect that he will be the one following this.  Deitra Mayo [facility] [phone]

## 2017-03-31 ENCOUNTER — Encounter (HOSPITAL_COMMUNITY): Payer: Self-pay | Admitting: Cardiovascular Disease

## 2017-04-03 ENCOUNTER — Encounter (HOSPITAL_COMMUNITY): Payer: Medicare HMO

## 2017-04-04 ENCOUNTER — Encounter (HOSPITAL_COMMUNITY)
Admission: RE | Admit: 2017-04-04 | Discharge: 2017-04-04 | Disposition: A | Discharge status: Home / Self Care | Payer: Medicare HMO | Source: Ambulatory Visit | Attending: Vascular Surgery | Admitting: Vascular Surgery

## 2017-04-04 ENCOUNTER — Other Ambulatory Visit: Payer: Self-pay

## 2017-04-04 ENCOUNTER — Encounter (HOSPITAL_COMMUNITY): Payer: Self-pay

## 2017-04-04 DIAGNOSIS — R911 Solitary pulmonary nodule: Secondary | ICD-10-CM | POA: Insufficient documentation

## 2017-04-04 DIAGNOSIS — Z01818 Encounter for other preprocedural examination: Secondary | ICD-10-CM | POA: Insufficient documentation

## 2017-04-04 DIAGNOSIS — I739 Peripheral vascular disease, unspecified: Secondary | ICD-10-CM | POA: Diagnosis not present

## 2017-04-04 DIAGNOSIS — G8929 Other chronic pain: Secondary | ICD-10-CM | POA: Diagnosis not present

## 2017-04-04 DIAGNOSIS — I10 Essential (primary) hypertension: Secondary | ICD-10-CM | POA: Diagnosis not present

## 2017-04-04 DIAGNOSIS — M545 Low back pain: Secondary | ICD-10-CM | POA: Diagnosis not present

## 2017-04-04 DIAGNOSIS — I2510 Atherosclerotic heart disease of native coronary artery without angina pectoris: Secondary | ICD-10-CM

## 2017-04-04 DIAGNOSIS — Z807 Family history of other malignant neoplasms of lymphoid, hematopoietic and related tissues: Secondary | ICD-10-CM | POA: Diagnosis not present

## 2017-04-04 DIAGNOSIS — Z7982 Long term (current) use of aspirin: Secondary | ICD-10-CM | POA: Insufficient documentation

## 2017-04-04 DIAGNOSIS — Z87891 Personal history of nicotine dependence: Secondary | ICD-10-CM

## 2017-04-04 DIAGNOSIS — K449 Diaphragmatic hernia without obstruction or gangrene: Secondary | ICD-10-CM | POA: Diagnosis not present

## 2017-04-04 DIAGNOSIS — E785 Hyperlipidemia, unspecified: Secondary | ICD-10-CM

## 2017-04-04 DIAGNOSIS — J449 Chronic obstructive pulmonary disease, unspecified: Secondary | ICD-10-CM | POA: Diagnosis not present

## 2017-04-04 DIAGNOSIS — F329 Major depressive disorder, single episode, unspecified: Secondary | ICD-10-CM | POA: Diagnosis not present

## 2017-04-04 DIAGNOSIS — I999 Unspecified disorder of circulatory system: Secondary | ICD-10-CM

## 2017-04-04 DIAGNOSIS — Z800 Family history of malignant neoplasm of digestive organs: Secondary | ICD-10-CM | POA: Diagnosis not present

## 2017-04-04 DIAGNOSIS — Z79899 Other long term (current) drug therapy: Secondary | ICD-10-CM

## 2017-04-04 HISTORY — DX: Peripheral vascular disease, unspecified: I73.9

## 2017-04-04 HISTORY — DX: Malignant (primary) neoplasm, unspecified: C80.1

## 2017-04-04 LAB — URINALYSIS, ROUTINE W REFLEX MICROSCOPIC
BILIRUBIN URINE: NEGATIVE
Glucose, UA: NEGATIVE mg/dL
Hgb urine dipstick: NEGATIVE
Ketones, ur: NEGATIVE mg/dL
Leukocytes, UA: NEGATIVE
NITRITE: NEGATIVE
PH: 6 (ref 5.0–8.0)
Protein, ur: NEGATIVE mg/dL
SPECIFIC GRAVITY, URINE: 1.008 (ref 1.005–1.030)

## 2017-04-04 LAB — CBC
HCT: 41 % (ref 39.0–52.0)
Hemoglobin: 13.9 g/dL (ref 13.0–17.0)
MCH: 31.4 pg (ref 26.0–34.0)
MCHC: 33.9 g/dL (ref 30.0–36.0)
MCV: 92.6 fL (ref 78.0–100.0)
Platelets: 199 10*3/uL (ref 150–400)
RBC: 4.43 MIL/uL (ref 4.22–5.81)
RDW: 13.1 % (ref 11.5–15.5)
WBC: 4.2 10*3/uL (ref 4.0–10.5)

## 2017-04-04 LAB — COMPREHENSIVE METABOLIC PANEL
ALK PHOS: 60 U/L (ref 38–126)
ALT: 21 U/L (ref 17–63)
ANION GAP: 8 (ref 5–15)
AST: 21 U/L (ref 15–41)
Albumin: 3.8 g/dL (ref 3.5–5.0)
BILIRUBIN TOTAL: 1.2 mg/dL (ref 0.3–1.2)
BUN: 17 mg/dL (ref 6–20)
CALCIUM: 9 mg/dL (ref 8.9–10.3)
CO2: 24 mmol/L (ref 22–32)
Chloride: 106 mmol/L (ref 101–111)
Creatinine, Ser: 1.09 mg/dL (ref 0.61–1.24)
GFR calc Af Amer: >60 mL/min (ref >60)
Glucose, Bld: 88 mg/dL (ref 65–99)
POTASSIUM: 4.1 mmol/L (ref 3.5–5.1)
Sodium: 138 mmol/L (ref 135–145)
TOTAL PROTEIN: 6.4 g/dL — AB (ref 6.5–8.1)

## 2017-04-04 LAB — SURGICAL PCR SCREEN
MRSA, PCR: NEGATIVE
STAPHYLOCOCCUS AUREUS: NEGATIVE

## 2017-04-04 LAB — PROTIME-INR
INR: 1
PROTHROMBIN TIME: 13.1 s (ref 11.4–15.2)

## 2017-04-04 LAB — TYPE AND SCREEN
ABO/RH(D): O POS
ANTIBODY SCREEN: NEGATIVE

## 2017-04-04 LAB — ABO/RH: ABO/RH(D): O POS

## 2017-04-04 LAB — APTT: aPTT: 33 seconds (ref 24–36)

## 2017-04-04 MED ORDER — CHLORHEXIDINE GLUCONATE CLOTH 2 % EX PADS: 6.0000 | MEDICATED_PAD | Freq: Once | CUTANEOUS | Status: DC

## 2017-04-04 NOTE — Progress Notes (Addendum)
RUE:AVWUJW, Elta Guadeloupe, MD  Cardiologist: Kirk Ruths, MD  EKG: 12/2016 in EPIC  Stress test: 03/2010 in EPIC  ECHO:  Pt denies  Cardiac Cath: 04/20/11 in EPIC  Chest x-ray:03/22/17 in Epic  Patient is on pletal his last dose was 04/01/16 per instructions

## 2017-04-04 NOTE — Progress Notes (Signed)
Manuel Friedman            04/03/2017                          [facility] - [place], [place] [address] Phone: [phone] Fax: [phone]              Your procedure is scheduled on 04/06/17.            Report to [facility] Admitting at 530 AM.            Call this number if you have problems the morning of surgery:            [phone]             Remember:            Do not eat food or drink liquids after midnight.            Take these medicines the morning of surgery with A SIP OF WATER -- cetirizine (zyrtec), escitalopram (lexapro).  Stop Pletal as instructed by your surgeon.   Beginning now, STOP taking any Aspirin (unless otherwise instructed by your surgeon), Aleve, Naproxen, Ibuprofen, Motrin, Advil, Goody's, BC's, all herbal medications, fish oil, and all vitamins  Continue all other medications as instructed by your physician except follow the above medication instructions before surgery             Do not wear jewelry, make-up or nail polish.            Do not wear lotions, powders, or perfumes, or deodorant.            Men may shave face and neck.            Do not bring valuables to the hospital.            [facility] is not responsible for any belongings or valuables.  Contacts, dentures or bridgework may not be worn into surgery.  Leave your suitcase in the car.  After surgery it may be brought to your room.  For patients admitted to the hospital, discharge time will be determined by your treatment team.  Patients discharged the day of surgery will not be allowed to drive home.    Manuel Friedman- Preparing For Surgery  Before surgery, you can play an important role. Because skin is not sterile, your skin needs to be as free of germs as possible. You can reduce the number of germs on your skin by washing with CHG (chlorahexidine gluconate) Soap before surgery.  CHG is an  antiseptic cleaner which kills germs and bonds with the skin to continue killing germs even after washing.  Please do not use if you have an allergy to CHG or antibacterial soaps. If your skin becomes reddened/irritated stop using the CHG.  Do not shave (including legs and underarms) for at least 48 hours prior to first CHG shower. It is OK to shave your face.  Please follow these instructions carefully.   1. Shower the NIGHT BEFORE SURGERY and the MORNING OF SURGERY with CHG.   2. If you chose to wash your hair, wash your hair first as usual with your normal shampoo.  3. After you shampoo, rinse your hair and body thoroughly to remove the shampoo.  4. Use CHG as you would any other liquid soap. You can apply  CHG directly to the skin and wash gently with a scrungie or a clean washcloth.   5. Apply the CHG Soap to your body ONLY FROM THE NECK DOWN.  Do not use on open wounds or open sores. Avoid contact with your eyes, ears, mouth and genitals (private parts). Wash Face and genitals (private parts)  with your normal soap.  6. Wash thoroughly, paying special attention to the area where your surgery will be performed.  7. Thoroughly rinse your body with warm water from the neck down.  8. DO NOT shower/wash with your normal soap after using and rinsing off the CHG Soap.  9. Pat yourself dry with a CLEAN TOWEL.  10. Wear CLEAN PAJAMAS to bed the night before surgery, wear comfortable clothes the morning of surgery  11. Place CLEAN SHEETS on your bed the night of your first shower and DO NOT SLEEP WITH PETS.    Day of Surgery: Do not apply any deodorants/lotions. Please wear clean clothes to the hospital/surgery center.     Please read over the following fact sheets that you were given. MRSA Information

## 2017-04-05 NOTE — Progress Notes (Signed)
Anesthesia Chart Review:  Pt is a 77 year old male scheduled for R femoral endarterectomy with vein patch angioplasty on 04/06/2017 with Deitra Mayo, MD  - PCP is [name], MD - Cardiologist is Kirk Ruths, MD. Last office visit 01/09/17  PMH includes:  CAD (CTO RCA with collaterals 04/20/11, otherwise mild nonobstructive disease 2013), HTN, hyperlipidemia, COPD, lung nodule (benign by 07/15/13 CT), PAD, AAA (3.3cm, stable by CT 01/12/17). Former smoker. BMI 28  Medications include: ASA 81mg , lipitor, pletal, imdur, losartan. Last dose pletal 04/01/17.   BP (!) 119/57   Pulse 84   Temp 36.6 C   Resp 18   Ht 6' (1.829 m)   Wt 206 lb 3.2 oz (93.5 kg)   SpO2 95%   BMI 27.97 kg/m   Preoperative labs reviewed.    CXR 03/22/17: Aortic atherosclerosis.  No edema or consolidation.  EKG 01/09/17: NSR. RBBB.   CT angio Aorta bifem on 01/12/17:  - VASCULAR 1. 3.3 cm fusiform infrarenal abdominal aortic aneurysm. Recommend followup by ultrasound in 3 years.  2. Tortuous bilateral iliac arterial systems without stenosis or aneurysm. 3. Calcified plaque across the RIGHT common femoral artery bifurcation into the proximal SFA resulting in stenosis of probable hemodynamic significance. Correlate with segmental pressures. 4. Mild eccentric calcified plaque in the LEFT common femoral artery across its bifurcation, without definite significant stenosis. 5. Contiguous three-vessel tibial runoff bilaterally.  - NON-VASCULAR 1. Colonic diverticulosis. 2. Prostatic enlargement. 3. Multilevel lumbar spondylitic change.  Cardiac cath 04/20/11:  1. Significant 1 vessel CAD with CTO of the proximal RCA with reasonable collaterals from the left side. The occlusion seems to be short and overall appears to be favorable for PCI to an antegrade approach. There is mild nonobstructive disease in the left anterior descending artery (LAD 30%; D1 20%) and left circumflex (OM3 20%). 2. Normal LV systolic  function with mildly elevated left ventricular end-diastolic pressure. 3. Tortuous distal aorta and iliac arteries without evidence of aneurysm or obstructive disease.  If no changes, I anticipate pt can proceed with surgery as scheduled.   Willeen Cass, FNP-BC [facility]/Anesthesiology Phone: [phone] 04/05/2017 9:29 AM

## 2017-04-06 ENCOUNTER — Other Ambulatory Visit: Payer: Self-pay

## 2017-04-06 ENCOUNTER — Ambulatory Visit (HOSPITAL_COMMUNITY)
Admission: RE | Admit: 2017-04-06 | Discharge: 2017-04-07 | Disposition: A | Discharge status: Home / Self Care | Payer: Medicare HMO | Source: Ambulatory Visit | Attending: Vascular Surgery | Admitting: Vascular Surgery

## 2017-04-06 ENCOUNTER — Encounter (HOSPITAL_COMMUNITY)
Admission: RE | Disposition: A | Discharge status: Home / Self Care | Payer: Self-pay | Source: Ambulatory Visit | Attending: Vascular Surgery

## 2017-04-06 ENCOUNTER — Inpatient Hospital Stay (HOSPITAL_COMMUNITY): Payer: Medicare HMO

## 2017-04-06 ENCOUNTER — Encounter (HOSPITAL_COMMUNITY): Payer: Self-pay | Admitting: Certified Registered Nurse Anesthetist

## 2017-04-06 DIAGNOSIS — M545 Low back pain: Secondary | ICD-10-CM | POA: Diagnosis not present

## 2017-04-06 DIAGNOSIS — I70211 Atherosclerosis of native arteries of extremities with intermittent claudication, right leg: Secondary | ICD-10-CM | POA: Diagnosis not present

## 2017-04-06 DIAGNOSIS — K449 Diaphragmatic hernia without obstruction or gangrene: Secondary | ICD-10-CM | POA: Insufficient documentation

## 2017-04-06 DIAGNOSIS — Z79899 Other long term (current) drug therapy: Secondary | ICD-10-CM | POA: Insufficient documentation

## 2017-04-06 DIAGNOSIS — I10 Essential (primary) hypertension: Secondary | ICD-10-CM | POA: Diagnosis not present

## 2017-04-06 DIAGNOSIS — G8929 Other chronic pain: Secondary | ICD-10-CM | POA: Diagnosis not present

## 2017-04-06 DIAGNOSIS — I2510 Atherosclerotic heart disease of native coronary artery without angina pectoris: Secondary | ICD-10-CM | POA: Insufficient documentation

## 2017-04-06 DIAGNOSIS — J449 Chronic obstructive pulmonary disease, unspecified: Secondary | ICD-10-CM | POA: Diagnosis not present

## 2017-04-06 DIAGNOSIS — Z7982 Long term (current) use of aspirin: Secondary | ICD-10-CM | POA: Insufficient documentation

## 2017-04-06 DIAGNOSIS — Z87891 Personal history of nicotine dependence: Secondary | ICD-10-CM | POA: Insufficient documentation

## 2017-04-06 DIAGNOSIS — Z800 Family history of malignant neoplasm of digestive organs: Secondary | ICD-10-CM | POA: Insufficient documentation

## 2017-04-06 DIAGNOSIS — I739 Peripheral vascular disease, unspecified: Principal | ICD-10-CM | POA: Insufficient documentation

## 2017-04-06 DIAGNOSIS — Z807 Family history of other malignant neoplasms of lymphoid, hematopoietic and related tissues: Secondary | ICD-10-CM | POA: Insufficient documentation

## 2017-04-06 DIAGNOSIS — F329 Major depressive disorder, single episode, unspecified: Secondary | ICD-10-CM | POA: Insufficient documentation

## 2017-04-06 DIAGNOSIS — E785 Hyperlipidemia, unspecified: Secondary | ICD-10-CM | POA: Insufficient documentation

## 2017-04-06 HISTORY — PX: PATCH ANGIOPLASTY: SHX6230

## 2017-04-06 HISTORY — PX: ENDARTERECTOMY FEMORAL: SHX5804

## 2017-04-06 LAB — CBC
HCT: 40.8 % (ref 39.0–52.0)
HEMOGLOBIN: 14.1 g/dL (ref 13.0–17.0)
MCH: 31.8 pg (ref 26.0–34.0)
MCHC: 34.6 g/dL (ref 30.0–36.0)
MCV: 91.9 fL (ref 78.0–100.0)
Platelets: 191 10*3/uL (ref 150–400)
RBC: 4.44 MIL/uL (ref 4.22–5.81)
RDW: 12.7 % (ref 11.5–15.5)
WBC: 8.9 10*3/uL (ref 4.0–10.5)

## 2017-04-06 LAB — CREATININE, SERUM
CREATININE: 1.25 mg/dL — AB (ref 0.61–1.24)
GFR, EST NON AFRICAN AMERICAN: 53 mL/min — AB (ref >60)

## 2017-04-06 SURGERY — ENDARTERECTOMY, FEMORAL
Anesthesia: General | Laterality: Right

## 2017-04-06 MED ORDER — LACTATED RINGERS IV SOLN
INTRAVENOUS | Status: DC | PRN
  Administered 2017-04-06 (×2): via INTRAVENOUS

## 2017-04-06 MED ORDER — HEMOSTATIC AGENTS (NO CHARGE) OPTIME
TOPICAL | Status: DC | PRN
  Administered 2017-04-06: 1 via TOPICAL

## 2017-04-06 MED ORDER — MIDAZOLAM HCL 2 MG/2ML IJ SOLN
INTRAMUSCULAR | Status: AC
  Filled 2017-04-06: qty 2

## 2017-04-06 MED ORDER — PROTAMINE SULFATE 10 MG/ML IV SOLN
INTRAVENOUS | Status: DC | PRN
  Administered 2017-04-06 (×4): 10 mg via INTRAVENOUS

## 2017-04-06 MED ORDER — 0.9 % SODIUM CHLORIDE (POUR BTL) OPTIME
TOPICAL | Status: DC | PRN
  Administered 2017-04-06: 2000 mL

## 2017-04-06 MED ORDER — VITAMIN D 1000 UNITS PO TABS: 2000.0000 [IU] | ORAL_TABLET | Freq: Every day | ORAL | Status: DC

## 2017-04-06 MED ORDER — SODIUM CHLORIDE 0.9 % IV SOLN: INTRAVENOUS | Status: DC

## 2017-04-06 MED ORDER — PHENYLEPHRINE 40 MCG/ML (10ML) SYRINGE FOR IV PUSH (FOR BLOOD PRESSURE SUPPORT)
PREFILLED_SYRINGE | INTRAVENOUS | Status: DC | PRN
  Administered 2017-04-06: 80 ug via INTRAVENOUS

## 2017-04-06 MED ORDER — HYDROMORPHONE HCL 1 MG/ML IJ SOLN
INTRAMUSCULAR | Status: AC
  Administered 2017-04-06: 0.5 mg via INTRAVENOUS
  Filled 2017-04-06: qty 1

## 2017-04-06 MED ORDER — LABETALOL HCL 5 MG/ML IV SOLN: 10.0000 mg | INTRAVENOUS | Status: DC | PRN

## 2017-04-06 MED ORDER — ISOSORBIDE MONONITRATE ER 60 MG PO TB24: 60.0000 mg | ORAL_TABLET | Freq: Every day | ORAL | Status: DC

## 2017-04-06 MED ORDER — ATORVASTATIN CALCIUM 80 MG PO TABS
80.0000 mg | ORAL_TABLET | Freq: Every day | ORAL | Status: DC
  Administered 2017-04-06: 80 mg via ORAL
  Filled 2017-04-06: qty 1

## 2017-04-06 MED ORDER — OXYCODONE-ACETAMINOPHEN 5-325 MG PO TABS
ORAL_TABLET | ORAL | Status: AC
  Filled 2017-04-06: qty 2

## 2017-04-06 MED ORDER — LIDOCAINE 2% (20 MG/ML) 5 ML SYRINGE
INTRAMUSCULAR | Status: DC | PRN
  Administered 2017-04-06: 80 mg via INTRAVENOUS

## 2017-04-06 MED ORDER — SODIUM CHLORIDE 0.9 % IV SOLN: 500.0000 mL | Freq: Once | INTRAVENOUS | Status: DC | PRN

## 2017-04-06 MED ORDER — ONDANSETRON HCL 4 MG/2ML IJ SOLN: 4.0000 mg | Freq: Four times a day (QID) | INTRAMUSCULAR | Status: DC | PRN

## 2017-04-06 MED ORDER — GUAIFENESIN-DM 100-10 MG/5ML PO SYRP: 15.0000 mL | ORAL_SOLUTION | ORAL | Status: DC | PRN

## 2017-04-06 MED ORDER — PHENYLEPHRINE 40 MCG/ML (10ML) SYRINGE FOR IV PUSH (FOR BLOOD PRESSURE SUPPORT)
PREFILLED_SYRINGE | INTRAVENOUS | Status: AC
  Filled 2017-04-06: qty 10

## 2017-04-06 MED ORDER — ONDANSETRON HCL 4 MG/2ML IJ SOLN
INTRAMUSCULAR | Status: AC
  Filled 2017-04-06: qty 2

## 2017-04-06 MED ORDER — POTASSIUM CHLORIDE CRYS ER 20 MEQ PO TBCR: 20.0000 meq | EXTENDED_RELEASE_TABLET | Freq: Every day | ORAL | Status: DC | PRN

## 2017-04-06 MED ORDER — DEXTROSE 5 % IV SOLN
INTRAVENOUS | Status: AC
  Filled 2017-04-06: qty 1.5

## 2017-04-06 MED ORDER — OXYCODONE-ACETAMINOPHEN 5-325 MG PO TABS: 1.0000 | ORAL_TABLET | Freq: Four times a day (QID) | ORAL | 0 refills | Status: DC | PRN

## 2017-04-06 MED ORDER — OXYCODONE-ACETAMINOPHEN 5-325 MG PO TABS
1.0000 | ORAL_TABLET | ORAL | Status: DC | PRN
  Administered 2017-04-06 – 2017-04-07 (×2): 2 via ORAL
  Filled 2017-04-06 (×2): qty 2

## 2017-04-06 MED ORDER — DEXTROSE 5 % IV SOLN
1.5000 g | Freq: Two times a day (BID) | INTRAVENOUS | Status: AC
  Administered 2017-04-06 – 2017-04-07 (×2): 1.5 g via INTRAVENOUS
  Filled 2017-04-06 (×2): qty 1.5

## 2017-04-06 MED ORDER — ROCURONIUM BROMIDE 50 MG/5ML IV SOSY
PREFILLED_SYRINGE | INTRAVENOUS | Status: DC | PRN
  Administered 2017-04-06 (×2): 10 mg via INTRAVENOUS
  Administered 2017-04-06: 20 mg via INTRAVENOUS
  Administered 2017-04-06: 50 mg via INTRAVENOUS
  Administered 2017-04-06: 10 mg via INTRAVENOUS

## 2017-04-06 MED ORDER — HEPARIN SODIUM (PORCINE) 1000 UNIT/ML IJ SOLN
INTRAMUSCULAR | Status: AC
  Filled 2017-04-06: qty 1

## 2017-04-06 MED ORDER — SODIUM CHLORIDE 0.9 % IV SOLN
INTRAVENOUS | Status: DC
Start: 2017-04-06 — End: 2017-04-06

## 2017-04-06 MED ORDER — PHENOL 1.4 % MT LIQD: 1.0000 | OROMUCOSAL | Status: DC | PRN

## 2017-04-06 MED ORDER — ESCITALOPRAM OXALATE 10 MG PO TABS
10.0000 mg | ORAL_TABLET | Freq: Every day | ORAL | Status: DC
  Filled 2017-04-06: qty 1

## 2017-04-06 MED ORDER — SODIUM CHLORIDE 0.9 % IV SOLN
INTRAVENOUS | Status: DC | PRN
  Administered 2017-04-06: 500 mL

## 2017-04-06 MED ORDER — FENTANYL CITRATE (PF) 100 MCG/2ML IJ SOLN
INTRAMUSCULAR | Status: DC | PRN
  Administered 2017-04-06: 25 ug via INTRAVENOUS
  Administered 2017-04-06: 50 ug via INTRAVENOUS
  Administered 2017-04-06 (×3): 25 ug via INTRAVENOUS
  Administered 2017-04-06 (×2): 50 ug via INTRAVENOUS

## 2017-04-06 MED ORDER — POLYETHYLENE GLYCOL 3350 17 G PO PACK: 17.0000 g | PACK | Freq: Every day | ORAL | Status: DC | PRN

## 2017-04-06 MED ORDER — MORPHINE SULFATE (PF) 4 MG/ML IV SOLN: 4.0000 mg | INTRAVENOUS | Status: DC | PRN

## 2017-04-06 MED ORDER — OXYCODONE HCL 5 MG PO TABS: 5.0000 mg | ORAL_TABLET | Freq: Once | ORAL | Status: DC | PRN

## 2017-04-06 MED ORDER — ALUM & MAG HYDROXIDE-SIMETH 200-200-20 MG/5ML PO SUSP: 15.0000 mL | ORAL | Status: DC | PRN

## 2017-04-06 MED ORDER — LIDOCAINE 2% (20 MG/ML) 5 ML SYRINGE
INTRAMUSCULAR | Status: AC
Start: 2017-04-06 — End: ?
  Filled 2017-04-06: qty 5

## 2017-04-06 MED ORDER — ACETAMINOPHEN 325 MG PO TABS: 325.0000 mg | ORAL_TABLET | ORAL | Status: DC | PRN

## 2017-04-06 MED ORDER — LIDOCAINE 2% (20 MG/ML) 5 ML SYRINGE
INTRAMUSCULAR | Status: AC
  Filled 2017-04-06: qty 5

## 2017-04-06 MED ORDER — HYDRALAZINE HCL 20 MG/ML IJ SOLN: 5.0000 mg | INTRAMUSCULAR | Status: DC | PRN

## 2017-04-06 MED ORDER — FENTANYL CITRATE (PF) 250 MCG/5ML IJ SOLN
INTRAMUSCULAR | Status: AC
  Filled 2017-04-06: qty 5

## 2017-04-06 MED ORDER — PROPOFOL 10 MG/ML IV BOLUS
INTRAVENOUS | Status: AC
  Filled 2017-04-06: qty 20

## 2017-04-06 MED ORDER — PANTOPRAZOLE SODIUM 40 MG PO TBEC: 40.0000 mg | DELAYED_RELEASE_TABLET | Freq: Every day | ORAL | Status: DC

## 2017-04-06 MED ORDER — PROPOFOL 10 MG/ML IV BOLUS
INTRAVENOUS | Status: DC | PRN
  Administered 2017-04-06: 150 mg via INTRAVENOUS
  Administered 2017-04-06: 50 mg via INTRAVENOUS

## 2017-04-06 MED ORDER — CILOSTAZOL 50 MG PO TABS
50.0000 mg | ORAL_TABLET | Freq: Two times a day (BID) | ORAL | Status: DC
  Filled 2017-04-06 (×2): qty 1

## 2017-04-06 MED ORDER — ZOLPIDEM TARTRATE 5 MG PO TABS
5.0000 mg | ORAL_TABLET | Freq: Every day | ORAL | Status: DC
  Administered 2017-04-06: 5 mg via ORAL
  Filled 2017-04-06: qty 1

## 2017-04-06 MED ORDER — ACETAMINOPHEN 650 MG RE SUPP: 325.0000 mg | RECTAL | Status: DC | PRN

## 2017-04-06 MED ORDER — PROTAMINE SULFATE 10 MG/ML IV SOLN
INTRAVENOUS | Status: AC
  Filled 2017-04-06: qty 5

## 2017-04-06 MED ORDER — MAGNESIUM SULFATE 2 GM/50ML IV SOLN: 2.0000 g | Freq: Every day | INTRAVENOUS | Status: DC | PRN

## 2017-04-06 MED ORDER — SUGAMMADEX SODIUM 200 MG/2ML IV SOLN
INTRAVENOUS | Status: AC
  Filled 2017-04-06: qty 2

## 2017-04-06 MED ORDER — ASPIRIN EC 81 MG PO TBEC
81.0000 mg | DELAYED_RELEASE_TABLET | Freq: Every day | ORAL | Status: DC
  Administered 2017-04-06: 81 mg via ORAL
  Filled 2017-04-06 (×2): qty 1

## 2017-04-06 MED ORDER — HEPARIN SODIUM (PORCINE) 5000 UNIT/ML IJ SOLN
5000.0000 [IU] | Freq: Three times a day (TID) | INTRAMUSCULAR | Status: DC
  Administered 2017-04-06 – 2017-04-07 (×2): 5000 [IU] via SUBCUTANEOUS
  Filled 2017-04-06 (×2): qty 1

## 2017-04-06 MED ORDER — METOPROLOL TARTRATE 5 MG/5ML IV SOLN: 2.0000 mg | INTRAVENOUS | Status: DC | PRN

## 2017-04-06 MED ORDER — DEXAMETHASONE SODIUM PHOSPHATE 10 MG/ML IJ SOLN
INTRAMUSCULAR | Status: AC
  Filled 2017-04-06: qty 1

## 2017-04-06 MED ORDER — ADULT MULTIVITAMIN W/MINERALS CH: 1.0000 | ORAL_TABLET | Freq: Every day | ORAL | Status: DC

## 2017-04-06 MED ORDER — OXYCODONE HCL 5 MG/5ML PO SOLN: 5.0000 mg | Freq: Once | ORAL | Status: DC | PRN

## 2017-04-06 MED ORDER — DEXTROSE 5 % IV SOLN
1.5000 g | INTRAVENOUS | Status: AC
  Administered 2017-04-06: 1.5 g via INTRAVENOUS

## 2017-04-06 MED ORDER — PHENYLEPHRINE HCL 10 MG/ML IJ SOLN
INTRAVENOUS | Status: DC | PRN
  Administered 2017-04-06: 25 ug/min via INTRAVENOUS

## 2017-04-06 MED ORDER — LOSARTAN POTASSIUM 25 MG PO TABS
25.0000 mg | ORAL_TABLET | Freq: Every day | ORAL | Status: DC
  Administered 2017-04-06: 25 mg via ORAL
  Filled 2017-04-06 (×2): qty 1

## 2017-04-06 MED ORDER — HYDROMORPHONE HCL 1 MG/ML IJ SOLN
0.2500 mg | INTRAMUSCULAR | Status: DC | PRN
  Administered 2017-04-06 (×2): 0.5 mg via INTRAVENOUS

## 2017-04-06 MED ORDER — SUGAMMADEX SODIUM 200 MG/2ML IV SOLN
INTRAVENOUS | Status: DC | PRN
  Administered 2017-04-06: 190 mg via INTRAVENOUS

## 2017-04-06 MED ORDER — DEXAMETHASONE SODIUM PHOSPHATE 10 MG/ML IJ SOLN
INTRAMUSCULAR | Status: DC | PRN
  Administered 2017-04-06: 10 mg via INTRAVENOUS

## 2017-04-06 MED ORDER — HEPARIN SODIUM (PORCINE) 1000 UNIT/ML IJ SOLN
INTRAMUSCULAR | Status: DC | PRN
  Administered 2017-04-06: 9000 [IU] via INTRAVENOUS

## 2017-04-06 MED ORDER — DOCUSATE SODIUM 100 MG PO CAPS
100.0000 mg | ORAL_CAPSULE | Freq: Every day | ORAL | Status: DC
  Filled 2017-04-06: qty 1

## 2017-04-06 MED ORDER — ONDANSETRON HCL 4 MG/2ML IJ SOLN
INTRAMUSCULAR | Status: DC | PRN
  Administered 2017-04-06: 4 mg via INTRAVENOUS

## 2017-04-06 SURGICAL SUPPLY — 43 items
CANISTER SUCT 3000ML PPV (MISCELLANEOUS) ×2 IMPLANT
CANNULA VESSEL 3MM 2 BLNT TIP (CANNULA) ×4 IMPLANT
CLIP VESOCCLUDE MED 24/CT (CLIP) ×2 IMPLANT
CLIP VESOCCLUDE SM WIDE 24/CT (CLIP) ×2 IMPLANT
DERMABOND ADVANCED (GAUZE/BANDAGES/DRESSINGS) ×2
DERMABOND ADVANCED .7 DNX12 (GAUZE/BANDAGES/DRESSINGS) ×2 IMPLANT
DRAIN CHANNEL 15F RND FF W/TCR (WOUND CARE) IMPLANT
ELECT REM PT RETURN 9FT ADLT (ELECTROSURGICAL) ×2
ELECTRODE REM PT RTRN 9FT ADLT (ELECTROSURGICAL) ×1 IMPLANT
EVACUATOR SILICONE 100CC (DRAIN) IMPLANT
GAUZE SPONGE 4X4 16PLY XRAY LF (GAUZE/BANDAGES/DRESSINGS) ×2 IMPLANT
GLOVE BIO SURGEON STRL SZ 6.5 (GLOVE) ×2 IMPLANT
GLOVE BIO SURGEON STRL SZ7 (GLOVE) ×2 IMPLANT
GLOVE BIO SURGEON STRL SZ7.5 (GLOVE) ×2 IMPLANT
GLOVE BIOGEL PI IND STRL 6.5 (GLOVE) ×6 IMPLANT
GLOVE BIOGEL PI IND STRL 8 (GLOVE) ×1 IMPLANT
GLOVE BIOGEL PI INDICATOR 6.5 (GLOVE) ×6
GLOVE BIOGEL PI INDICATOR 8 (GLOVE) ×1
GLOVE ECLIPSE 6.5 STRL STRAW (GLOVE) ×2 IMPLANT
GLOVE SURG SS PI 7.0 STRL IVOR (GLOVE) ×2 IMPLANT
GOWN STRL REUS W/ TWL LRG LVL3 (GOWN DISPOSABLE) ×6 IMPLANT
GOWN STRL REUS W/TWL LRG LVL3 (GOWN DISPOSABLE) ×6
HEMOSTAT SNOW SURGICEL 2X4 (HEMOSTASIS) ×2 IMPLANT
KIT BASIN OR (CUSTOM PROCEDURE TRAY) ×2 IMPLANT
KIT ROOM TURNOVER OR (KITS) ×2 IMPLANT
LOOP VESSEL MINI RED (MISCELLANEOUS) ×4 IMPLANT
NS IRRIG 1000ML POUR BTL (IV SOLUTION) ×4 IMPLANT
PACK PERIPHERAL VASCULAR (CUSTOM PROCEDURE TRAY) ×2 IMPLANT
PAD ARMBOARD 7.5X6 YLW CONV (MISCELLANEOUS) ×4 IMPLANT
SPONGE SURGIFOAM ABS GEL 100 (HEMOSTASIS) IMPLANT
STAPLER VISISTAT (STAPLE) IMPLANT
SUT PROLENE 5 0 C 1 24 (SUTURE) ×4 IMPLANT
SUT PROLENE 6 0 BV (SUTURE) ×4 IMPLANT
SUT SILK 2 0 (SUTURE) ×1
SUT SILK 2-0 18XBRD TIE 12 (SUTURE) ×1 IMPLANT
SUT VIC AB 2-0 CTB1 (SUTURE) ×2 IMPLANT
SUT VIC AB 3-0 SH 27 (SUTURE) ×2
SUT VIC AB 3-0 SH 27X BRD (SUTURE) ×2 IMPLANT
SUT VICRYL 4-0 PS2 18IN ABS (SUTURE) ×4 IMPLANT
TOWEL GREEN STERILE (TOWEL DISPOSABLE) ×2 IMPLANT
TRAY FOLEY W/METER SILVER 16FR (SET/KITS/TRAYS/PACK) ×2 IMPLANT
UNDERPAD 30X30 (UNDERPADS AND DIAPERS) ×2 IMPLANT
WATER STERILE IRR 1000ML POUR (IV SOLUTION) ×2 IMPLANT

## 2017-04-06 NOTE — Discharge Instructions (Signed)
° °[facility] ° °Discharge instructions ° °Lower Extremity Surgery ° °Please refer to the following instruction for your post-procedure care. Your surgeon or physician assistant will discuss any changes with you. ° °Activity ° °You are encouraged to walk as much as you can. You can slowly return to normal activities during the month after your surgery. Avoid strenuous activity and heavy lifting until your doctor tells you it's OK. Avoid activities such as vacuuming or swinging a golf club. Do not drive until your doctor give the OK and you are no longer taking prescription pain medications. It is also normal to have difficulty with sleep habits, eating and bowel movement after surgery. These will go away with time. ° °Bathing/Showering ° °You may shower after you go home. Do not soak in a bathtub, hot tub, or swim until the incision heals completely. ° °Incision Care ° °Clean your incision with mild soap and water. Shower every day. Pat the area dry with a clean towel. You do not need a bandage unless otherwise instructed. Do not apply any ointments or creams to your incision. If you have open wounds you will be instructed how to care for them or a visiting nurse may be arranged for you. If you have staples or sutures along your incision they will be removed at your post-op appointment. You may have skin glue on your incision. Do not peel it off. It will come off on its own in about one week. ° °Wash the groin wound with soap and water daily and pat dry. (No tub bath-only shower)  Then put a dry gauze or washcloth in the groin to keep this area dry to help prevent wound infection.  Do this daily and as needed.  Do not use Vaseline or neosporin on your incisions.  Only use soap and water on your incisions and then protect and keep dry. ° °Diet ° °Resume your normal diet. There are no special food restrictions following this procedure. A low fat/ low cholesterol diet is recommended  for all patients with vascular disease. In order to heal from your surgery, it is CRITICAL to get adequate nutrition. Your body requires vitamins, minerals, and protein. Vegetables are the best source of vitamins and minerals. Vegetables also provide the perfect balance of protein. Processed food has little nutritional value, so try to avoid this. ° °Medications ° °Resume taking all your medications unless your doctor or physician assistant tells you not to. If your incision is causing pain, you may take over-the-counter pain relievers such as acetaminophen (Tylenol). If you were prescribed a stronger pain medication, please aware these medication can cause nausea and constipation. Prevent nausea by taking the medication with a snack or meal. Avoid constipation by drinking plenty of fluids and eating foods with high amount of fiber, such as fruits, vegetables, and grains. Take Colace 100 mg (an over-the-counter stool softener) twice a day as needed for constipation. Do not take Tylenol if you are taking prescription pain medications. ° °Follow Up ° °Our office will schedule a follow up appointment 2-3 weeks following discharge. ° °Please call us immediately for any of the following conditions ° °•Severe or worsening pain in your legs or feet while at rest or while walking •Increase pain, redness, warmth, or drainage (pus) from your incision site(s) °Fever of 101 degree or higher °The swelling in your leg with the bypass suddenly worsens and becomes more painful than when you were in the hospital °If you have been   instructed to feel your graft pulse then you should do so every day. If you can no longer feel this pulse, call the office immediately. Not all patients are given this instruction. ° °Leg swelling is common after leg bypass surgery. ° °The swelling should improve over a few months following surgery. To improve the swelling, you may elevate your legs above the level of your heart while you are sitting or  resting. Your surgeon or physician assistant may ask you to apply an ACE wrap or wear compression (TED) stockings to help to reduce swelling. ° °Reduce your risk of vascular disease ° °Stop smoking. If you would like help call [facility] at 1-800-QUIT-NOW (1-800-784-8669) or [place] at [phone]. ° °Manage your cholesterol °Maintain a desired weight °Control your diabetes weight °Control your diabetes °Keep your blood pressure down ° °If you have any questions, please call the office at [phone] ° ° °

## 2017-04-06 NOTE — Anesthesia Procedure Notes (Signed)
Procedure Name: Intubation Date/Time: 04/06/2017 7:48 AM Performed by: Genelle Bal, CRNA Pre-anesthesia Checklist: Patient identified, Emergency Drugs available, Suction available and Patient being monitored Patient Re-evaluated:Patient Re-evaluated prior to induction Oxygen Delivery Method: Circle system utilized Preoxygenation: Pre-oxygenation with 100% oxygen Induction Type: IV induction Ventilation: Mask ventilation without difficulty and Oral airway inserted - appropriate to patient size Laryngoscope Size: Sabra Heck and 2 Grade View: Grade I Tube type: Oral Tube size: 7.5 mm Number of attempts: 1 Airway Equipment and Method: Stylet and Oral airway Placement Confirmation: ETT inserted through vocal cords under direct vision,  positive ETCO2 and breath sounds checked- equal and bilateral Secured at: 24 cm Tube secured with: Tape Dental Injury: Teeth and Oropharynx as per pre-operative assessment  Comments: DL x1 MAC 4 by SRNA, grade III view, DL x 1 Mil 2 by Dr. Orene Desanctis with grade I view, ATOI, +BBS/etCO2. O2 sats remained above 98% the entire duration.

## 2017-04-06 NOTE — Anesthesia Preprocedure Evaluation (Signed)
Anesthesia Evaluation  Patient identified by MRN, date of birth, ID band Patient awake    Reviewed: Allergy & Precautions, NPO status , Patient's Chart, lab work & pertinent test results  Airway Mallampati: II  TM Distance: >3 FB Neck ROM: Full    Dental  (+) Teeth Intact, Caps, Dental Advisory Given   Pulmonary COPD, former smoker,    breath sounds clear to auscultation       Cardiovascular hypertension, + CAD and + Peripheral Vascular Disease   Rhythm:Regular Rate:Normal     Neuro/Psych  Neuromuscular disease    GI/Hepatic Neg liver ROS, hiatal hernia,   Endo/Other  negative endocrine ROS  Renal/GU negative Renal ROS     Musculoskeletal   Abdominal   Peds  Hematology negative hematology ROS (+)   Anesthesia Other Findings   Reproductive/Obstetrics                             Anesthesia Physical Anesthesia Plan  ASA: III  Anesthesia Plan: General   Post-op Pain Management:    Induction: Intravenous  PONV Risk Score and Plan: 3 and Ondansetron, Dexamethasone and Treatment may vary due to age or medical condition  Airway Management Planned: Oral ETT  Additional Equipment:   Intra-op Plan:   Post-operative Plan: Extubation in OR  Informed Consent: I have reviewed the patients History and Physical, chart, labs and discussed the procedure including the risks, benefits and alternatives for the proposed anesthesia with the patient or authorized representative who has indicated his/her understanding and acceptance.   Dental advisory given  Plan Discussed with: CRNA  Anesthesia Plan Comments:         Anesthesia Quick Evaluation

## 2017-04-06 NOTE — Op Note (Signed)
NAME: Manuel Friedman    MRN: [id number] DOB: 1940/10/22    DATE OF OPERATION: 04/06/2017  PREOP DIAGNOSIS:    Disabling claudication right lower extremity  POSTOP DIAGNOSIS:    Same  PROCEDURE:    Right common femoral artery and superficial femoral artery endarterectomy with vein patch angioplasty using right great saphenous vein  SURGEON: Judeth Cornfield. Scot Dock, MD, FACS  ASSIST: Arlee Muslim PA  ANESTHESIA: General  EBL: Minimal  INDICATIONS:    JAMIAH RECORE is a 77 y.o. male who presented with disabling claudication of the right lower extremity.  He underwent an arteriogram by Dr. Quay Burow which showed a large bulky calcific plaque in the common femoral artery.  Below that he had no significant infrainguinal arterial occlusive disease.  It was felt that his best option for revascularization was femoral endarterectomy.  FINDINGS:   The plaque extended well up into the external iliac artery and also about 4 cm down into the superficial femoral artery.  At the completion of the procedure there was a palpable dorsalis pedis pulse with biphasic Doppler signals in the posterior tibial and dorsalis pedis positions.  TECHNIQUE:   The patient was taken to the operating room and received a general anesthetic.  The right leg was prepped and draped in usual sterile fashion.  A longitudinal incision was made in the right groin.  Dissection was carried down to the common femoral artery which was calcified.  In order to get control of the artery above the plaque I had to dissected well above the inguinal ligament.  I was able to dissected up fairly high on the external iliac artery and this was controlled with a vessel loop.  Multiple branches were controlled with 3-0 silk ties.  The dissection was then continued distally onto the superficial femoral artery.  There was a plaque along the medial aspect of the superficial femoral artery which extended several centimeters down the  superficial femoral artery.  In order to get below this area I had to extend the dissection.  I was able to get below the plaque.  Also controlled the deep femoral artery with a vessel loop.  Through the same incision the saphenofemoral junction was dissected free.  Given the length of the endarterectomy required had to make a small separate incision distal to this incision in order to harvest adequate length of great saphenous vein.  Branches were divided between clips and 3-0 silk ties.  The vein was ligated distally and proximally and irrigated up with heparinized saline.  This was preserved and heparinized saline.  The patient was heparinized.  The external iliac artery was clamped on the right and then the superficial femoral artery and deep femoral arteries were controlled.  Multiple branches were controlled with Vesseloops.  A longitudinal arteriotomy was made in the common femoral artery.  This was extended above the plaque into the external iliac artery and then distally down into the superficial femoral artery bulb beyond the plaque in the superficial femoral artery.  An endarterectomy plane was established in the superficial femoral artery and this was continued proximally.  The deep femoral artery was endarterectomized and the endarterectomy established up to the external iliac artery.  The plaque was removed in its entirety.  The artery was irrigated with copious amounts of heparin and all loose debris removed.  The vein was opened longitudinally and used in a reverse fashion.  It was sewn using 2 continuous 5-0 Prolene sutures.  Prior to completing  the anastomosis the arteries were backbled and flushed appropriately and the anastomosis completed.  Flow was reestablished first to the deep femoral artery down to the superficial femoral artery.  At the completion there was a palpable dorsalis pedis pulse and biphasic Doppler signals in the right foot.  The heparin was partially reversed with  protamine.  The vein harvest incision in the thigh was closed with a deep layer of 3-0 Vicryl and the skin closed with 4-0 Vicryl.  The groin incision was closed with a deep layer of 2-0 Vicryl, a subcutaneous layer with 3-0 Vicryl, and the skin closed with 4-0 Vicryl.  Dermabond was applied.  The patient tolerated the procedure well and was transferred to the recovery room in stable condition.  All needle and sponge counts were correct.  Deitra Mayo, MD, FACS [facility]  DATE OF DICTATION:   04/06/2017

## 2017-04-06 NOTE — Interval H&P Note (Signed)
History and Physical Interval Note:  04/06/2017 7:28 AM  Manuel Friedman  has presented today for surgery, with the diagnosis of claudication right leg  The various methods of treatment have been discussed with the patient and family. After consideration of risks, benefits and other options for treatment, the patient has consented to  Procedure(s): ENDARTERECTOMY FEMORAL WITH VEIN PATCH ANGIOPLASTY (Right) as a surgical intervention .  The patient's history has been reviewed, patient examined, no change in status, stable for surgery.  I have reviewed the patient's chart and labs.  Questions were answered to the patient's satisfaction.     Deitra Mayo

## 2017-04-06 NOTE — Progress Notes (Addendum)
  Day of Surgery Note    Subjective:  Says he has some soreness   Vitals:   04/06/17 1211 04/06/17 1215  BP: 118/69   Pulse: 80   Resp: 13   Temp:  97.8 F (36.6 C)  SpO2: 97%     Incisions:   Clean and dry without hematoma Extremities:  Easily palpable right DP/PT pulses Cardiac:  regular Lungs:  Non labored    Assessment/Plan:  This is a 77 y.o. male who is s/p  Right common femoral artery and superficial femoral artery endarterectomy with vein patch angioplasty using right great saphenous vein    -pt doing well in recovery with palpable right DP/PT pulses -pain well controlled -to 4 east when bed available   Leontine Locket, PA-C 04/06/2017 12:41 PM [phone]   I have interviewed the patient and examined the patient. I agree with the findings by the PA. He has a palpable dorsalis pedis and posterior tibial pulse on the right.  Gae Gallop, MD [phone]

## 2017-04-06 NOTE — Anesthesia Postprocedure Evaluation (Signed)
Anesthesia Post Note  Patient: Manuel Friedman  Procedure(s) Performed: ENDARTERECTOMY RIGHT COMMON FEMORAL ARTERY (Right ) PATCH ANGIOPLASTY USING RIGHT GREATER SAPHENOUS VEIN ON RIGHT COMMON FEMORAL ARTERY (Right )     Patient location during evaluation: PACU Anesthesia Type: General Level of consciousness: awake and alert Pain management: pain level controlled Vital Signs Assessment: post-procedure vital signs reviewed and stable Respiratory status: spontaneous breathing, nonlabored ventilation, respiratory function stable and patient connected to nasal cannula oxygen Cardiovascular status: blood pressure returned to baseline and stable Postop Assessment: no apparent nausea or vomiting Anesthetic complications: no    Last Vitals:  Vitals:   04/06/17 1241 04/06/17 1311  BP: 109/61 115/82  Pulse: 80 99  Resp: 14 (!) 21  Temp:    SpO2: 98% 95%    Last Pain:  Vitals:   04/06/17 1315  TempSrc:   PainSc: 0-No pain                 Harbert Fitterer,Banjamin TERRILL

## 2017-04-06 NOTE — Transfer of Care (Signed)
Immediate Anesthesia Transfer of Care Note  Patient: Manuel Friedman  Procedure(s) Performed: ENDARTERECTOMY RIGHT COMMON FEMORAL ARTERY (Right ) PATCH ANGIOPLASTY USING RIGHT GREATER SAPHENOUS VEIN ON RIGHT COMMON FEMORAL ARTERY (Right )  Patient Location: PACU  Anesthesia Type:General  Level of Consciousness: awake, alert  and oriented  Airway & Oxygen Therapy: Patient Spontanous Breathing and Patient connected to face mask oxygen  Post-op Assessment: Report given to RN and Post -op Vital signs reviewed and stable  Post vital signs: Reviewed and stable  Last Vitals:  Vitals:   04/06/17 0553  BP: 140/81  Pulse: 73  Temp: (!) 36.4 C  SpO2: 100%    Last Pain:  Vitals:   04/06/17 0553  TempSrc: Oral  PainSc: 5       Patients Stated Pain Goal: 4 (43/27/61 4709)  Complications: No apparent anesthesia complications

## 2017-04-07 ENCOUNTER — Encounter (HOSPITAL_COMMUNITY): Payer: Self-pay | Admitting: Vascular Surgery

## 2017-04-07 ENCOUNTER — Telehealth: Payer: Self-pay | Admitting: Vascular Surgery

## 2017-04-07 ENCOUNTER — Ambulatory Visit (HOSPITAL_BASED_OUTPATIENT_CLINIC_OR_DEPARTMENT_OTHER): Payer: Medicare HMO

## 2017-04-07 DIAGNOSIS — I739 Peripheral vascular disease, unspecified: Secondary | ICD-10-CM

## 2017-04-07 LAB — CBC
HCT: 38.9 % — ABNORMAL LOW (ref 39.0–52.0)
HEMOGLOBIN: 13.2 g/dL (ref 13.0–17.0)
MCH: 31.3 pg (ref 26.0–34.0)
MCHC: 33.9 g/dL (ref 30.0–36.0)
MCV: 92.2 fL (ref 78.0–100.0)
Platelets: 177 10*3/uL (ref 150–400)
RBC: 4.22 MIL/uL (ref 4.22–5.81)
RDW: 12.6 % (ref 11.5–15.5)
WBC: 11.8 10*3/uL — ABNORMAL HIGH (ref 4.0–10.5)

## 2017-04-07 LAB — BASIC METABOLIC PANEL
Anion gap: 9 (ref 5–15)
BUN: 19 mg/dL (ref 6–20)
CHLORIDE: 106 mmol/L (ref 101–111)
CO2: 23 mmol/L (ref 22–32)
CREATININE: 1.13 mg/dL (ref 0.61–1.24)
Calcium: 8.6 mg/dL — ABNORMAL LOW (ref 8.9–10.3)
GFR calc non Af Amer: >60 mL/min (ref >60)
Glucose, Bld: 144 mg/dL — ABNORMAL HIGH (ref 65–99)
Potassium: 4.6 mmol/L (ref 3.5–5.1)
Sodium: 138 mmol/L (ref 135–145)

## 2017-04-07 NOTE — Progress Notes (Addendum)
  Progress Note    04/07/2017 7:34 AM 1 Day Post-Op  Subjective:  Some soreness to R groin incisions however no rest pain R foot   Vitals:   04/06/17 2351 04/07/17 0336  BP: 119/80 108/76  Pulse: 73   Resp: 18   Temp: 98.4 F (36.9 C) 98.3 F (36.8 C)  SpO2: 96%    Physical Exam: Cardiac:  RRR Lungs:  Non labored Incisions:  Soft; no drainage; some erythema distal to groin incision Extremities:  Palpable R DP and PT Abdomen:  soft Neurologic: A&O  CBC    Component Value Date/Time   WBC 11.8 (H) 04/07/2017 0320   RBC 4.22 04/07/2017 0320   HGB 13.2 04/07/2017 0320   HGB 14.1 03/22/2017 0926   HCT 38.9 (L) 04/07/2017 0320   HCT 40.0 03/22/2017 0926   PLT 177 04/07/2017 0320   PLT 226 03/22/2017 0926   MCV 92.2 04/07/2017 0320   MCV 90 03/22/2017 0926   MCH 31.3 04/07/2017 0320   MCHC 33.9 04/07/2017 0320   RDW 12.6 04/07/2017 0320   RDW 12.5 03/22/2017 0926   LYMPHSABS 1.7 03/22/2017 0926   MONOABS 0.6 04/18/2011 1547   EOSABS 0.1 03/22/2017 0926   BASOSABS 0.0 03/22/2017 0926    BMET    Component Value Date/Time   NA 138 04/07/2017 0320   NA 143 03/22/2017 0926   K 4.6 04/07/2017 0320   CL 106 04/07/2017 0320   CO2 23 04/07/2017 0320   GLUCOSE 144 (H) 04/07/2017 0320   BUN 19 04/07/2017 0320   BUN 19 03/22/2017 0926   CREATININE 1.13 04/07/2017 0320   CALCIUM 8.6 (L) 04/07/2017 0320   GFRNONAA >60 04/07/2017 0320   GFRAA >60 04/07/2017 0320    INR    Component Value Date/Time   INR 1.00 04/04/2017 1000     Intake/Output Summary (Last 24 hours) at 04/07/2017 0734 Last data filed at 04/07/2017 0500 Gross per 24 hour  Intake 2965 ml  Output 1130 ml  Net 1835 ml     Assessment/Plan:  77 y.o. male is s/p Right common femoral artery and superficial femoral artery endarterectomy with vein patch angioplasty using right great saphenous vein  1 Day Post-Op   Groin incisions intact Palpable R pedal pulses Ambulate after breakfast Patient  will be called to follow up in about 2-3 weeks with Dr. Shellia Carwin, PA-C [facility] [phone] 04/07/2017 7:34 AM

## 2017-04-07 NOTE — Discharge Summary (Signed)
Physician Discharge Summary   Patient ID: Manuel Friedman [id number] 76 y.o. 08-Dec-1940  Admit date: 04/06/2017  Discharge date and time: 04/07/17   Admitting Physician: Angelia Mould, MD   Discharge Physician: same  Admission Diagnoses: claudication right leg  Discharge Diagnoses: same  Admission Condition: fair  Discharged Condition: good  Indication for Admission: Disabling claudication right lower extremity  Hospital Course: Manuel Friedman is a 77 year old male with an outpatient for right common femoral artery and superficial femoral artery endarterectomy with greater saphenous vein patch angioplasty by Dr. Scot Dock on 04/06/17 due to disabling claudication of right lower extremity.  He tolerated this procedure well and was admitted overnight to monitor circulatory status.  POD #1 morning drawn labs do not demonstrate significant drop in hemoglobin nor do they demonstrate any significant changes in electrolytes.  He is tolerating a home diet and feeling fit for discharge.  He will have to ambulate with the nursing staff after breakfast for discharge home.  He has a palpable right PT and DP on exam this morning and incisions of right groin are stable without impressive hematoma or drainage.  He will be prescribed #8 5/325 mg Percocet for continued postoperative pain control.  He will follow-up in office in about 2-3 weeks to see Dr. Scot Dock.  Discharge instructions were reviewed with the patient and he voices his understanding.  He will be discharged this morning in stable condition.  Consults: None  Treatments: surgery by Dr. Scot Dock 04/06/17: Right common femoral artery and superficial femoral artery endarterectomy with vein patch angioplasty using right great saphenous vein  Discharge Exam: See progress note 04/07/17 Vitals:   04/06/17 2351 04/07/17 0336  BP: 119/80 108/76  Pulse: 73   Resp: 18   Temp: 98.4 F (36.9 C) 98.3 F (36.8 C)  SpO2: 96%     Disposition:  01-Home or Self Care  Patient Instructions:  Allergies as of 04/07/2017   No Known Allergies     Medication List    TAKE these medications   aspirin EC 81 MG tablet Take 1 tablet (81 mg total) by mouth daily.   atorvastatin 80 MG tablet Commonly known as:  LIPITOR Take 1 tablet (80 mg total) by mouth daily.   cetirizine 10 MG tablet Commonly known as:  ZYRTEC Take 10 mg by mouth as needed for allergies.   cilostazol 50 MG tablet Commonly known as:  PLETAL TAKE 1 TABLET TWICE DAILY What changed:    how much to take  how to take this  when to take this   CINNAMON PO Take 1 tablet by mouth daily.   COQ-10 PO Take 1 tablet by mouth daily.   escitalopram 10 MG tablet Commonly known as:  LEXAPRO Take 10 mg by mouth daily.   ibuprofen 200 MG tablet Commonly known as:  ADVIL,MOTRIN Take 400 mg by mouth daily as needed for moderate pain.   isosorbide mononitrate 60 MG 24 hr tablet Commonly known as:  IMDUR TAKE 1 TABLET EVERY DAY What changed:    how much to take  how to take this  when to take this   losartan 50 MG tablet Commonly known as:  COZAAR Take 25 mg by mouth daily.   multivitamin with minerals Tabs tablet Take 1 tablet by mouth daily.   naproxen sodium 220 MG tablet Commonly known as:  ALEVE Take 220 mg by mouth daily as needed (pain).   oxyCODONE-acetaminophen 5-325 MG tablet Commonly known as:  PERCOCET/ROXICET Take 1 tablet by  mouth every 6 (six) hours as needed for moderate pain.   Vitamin D3 2000 units capsule Take 2,000 Units by mouth daily.   zolpidem 5 MG tablet Commonly known as:  AMBIEN Take 5 mg by mouth at bedtime.      Activity: activity as tolerated Diet: regular diet Wound Care: keep wound clean and dry  Follow-up with Dr. Scot Dock in 3 weeks.  SignedDagoberto Ligas 04/07/2017 9:05 AM

## 2017-04-07 NOTE — Telephone Encounter (Signed)
Sched appt 05/03/17 at 11:15. Lm on hm# to inform pt of appt.

## 2017-04-07 NOTE — Care Management Note (Signed)
Case Management Note  Patient Details  Name: Manuel Friedman MRN: [id number] Date of Birth: 1940-09-16  Subjective/Objective:                 Patient with order to DC to home today. Chart reviewed. No Home Health or Equipment needs, no unacknowledged Case Management consults or medication needs identified at the time of this note. Plan for DC to home.  CM signing off. If new needs arise today prior to discharge, please call Carles Collet RN CM at [phone].   Action/Plan:   Expected Discharge Date:  04/07/17               Expected Discharge Plan:  Home/Self Care  In-House Referral:     Discharge planning Services  CM Consult  Post Acute Care Choice:    Choice offered to:     DME Arranged:    DME Agency:     HH Arranged:    HH Agency:     Status of Service:  Completed, signed off  If discussed at [employer] of Stay Meetings, dates discussed:    Additional Comments:  Carles Collet, RN 04/07/2017, 9:35 AM

## 2017-04-07 NOTE — Progress Notes (Signed)
Bilateral Post OP ABIs completed. Rt 1.19 Lt 1.16 [facility], RVS 04/07/2016 10:40 AM

## 2017-04-07 NOTE — Telephone Encounter (Signed)
-----   Message from Mena Goes, RN sent at 04/06/2017 12:55 PM EST ----- Regarding: 2-3 weeks    ----- Message ----- From: Gabriel Earing, PA-C Sent: 04/06/2017  12:42 PM To: Vvs Charge Pool  S/p Right common femoral artery and superficial femoral artery endarterectomy with vein patch angioplasty using right great saphenous vein  F/u with Dr. Scot Dock in 2-3 weeks.  Thanks

## 2017-04-11 ENCOUNTER — Other Ambulatory Visit: Payer: Self-pay

## 2017-04-11 NOTE — Patient Outreach (Signed)
[facility]) Care Management  04/11/2017  Manuel Friedman 1941/01/16 [id number]     Transition of Care Referral  Referral Date: 04/11/17 Referral Source: [employer] Discharge Report Date of Admission: 04/06/17 Diagnosis: PAD s/p endarterectomy Date of Discharge: 04/07/17 Facility: [place]: [facility]    Outreach attempt # 1 to patient. Spoke with patient. He is pleased to report that he is doing well. He stets he is getting stronger and better everyday. He voices he still has some mild soreness to surgical site but denies any pain. He voices incision looks good. RN CM reviewed with patient s/s of infection and when to alert MD. Patient voices that he was taking Pletal (blood thinner) prescribed by Dr. Gwenlyn Found prior to surgery. He voices he has not taken med since discharge home and he was under the impression that the procedure corrected the issue and that he did not need to take med longer. Advised patient that per discharged instructions med ws listed to be taken twice a day. Patient voices he was unaware of this. RN CM advised patient to contact MD to alert them that he has not taken med since returning home and to see what MD advises. He voiced understanding and will do so as son as possible. He has f/u appt with surgeon in a few weeks. He denies any issues with transportation. He has supportive spouse in the home who is able to assist him as needed. No further RN CM needs or concerns at this time.    Plan: RN CM will notify [facility] administrative assistant of case status.    Enzo Montgomery, RN,BSN,CCM [facility] Telephonic Care Management Coordinator Direct Phone: [phone] Toll Free: [phone] Fax: [phone]

## 2017-04-12 ENCOUNTER — Telehealth: Payer: Self-pay

## 2017-04-12 NOTE — Telephone Encounter (Signed)
Returned patient call. He was having some swelling of his right leg at his thigh and calf. Has been up on his leg more since he has been home. No redness, fever, or signs of infection. Instructed patient to limit his walking to necessary activities for short periods of time and when resting elevate his leg even with or slightly above heart. Call back if swelling worsens or has signs of infection.

## 2017-04-18 ENCOUNTER — Encounter: Payer: Self-pay | Admitting: Cardiovascular Disease

## 2017-04-18 ENCOUNTER — Ambulatory Visit: Payer: Medicare HMO | Admitting: Cardiovascular Disease

## 2017-04-18 VITALS — BP 132/64 | HR 79 | Ht 72.0 in | Wt 208.0 lb

## 2017-04-18 DIAGNOSIS — I739 Peripheral vascular disease, unspecified: Secondary | ICD-10-CM | POA: Diagnosis not present

## 2017-04-18 DIAGNOSIS — I714 Abdominal aortic aneurysm, without rupture, unspecified: Secondary | ICD-10-CM

## 2017-04-18 NOTE — Patient Instructions (Signed)
Medication Instructions: Your physician recommends that you continue on your current medications as directed. Please refer to the Current Medication list given to you today.   Testing/Procedures:  Now and in 6 months: Your physician has requested that you have a lower extremity arterial duplex. During this test, ultrasound is used to evaluate arterial blood flow in the legs. Allow one hour for this exam. There are no restrictions or special instructions.  Your physician has requested that you have an ankle brachial index (ABI). During this test an ultrasound and blood pressure cuff are used to evaluate the arteries that supply the arms and legs with blood. Allow thirty minutes for this exam. There are no restrictions or special instructions.  Now: Your physician has requested that you have an abdominal aorta duplex. During this test, an ultrasound is used to evaluate the aorta. Allow 30 minutes for this exam. Do not eat after midnight the day before and avoid carbonated beverages    Follow-Up: Your physician recommends that you schedule a follow-up appointment in: 3 months with Dr. Gwenlyn Found.  If you need a refill on your cardiac medications before your next appointment, please call your pharmacy.

## 2017-04-18 NOTE — Progress Notes (Signed)
04/18/2017 OLSON LUCARELLI   08-Jul-1940  [id number]  Primary Physician [name], MD Primary Cardiologist: Lorretta Harp MD Lupe Carney, [place]  HPI:  Manuel Friedman is a 77 y.o.  married Caucasian male father of 2, grandfather of 3 granddaughters referred by Dr. Stanford Breed for evaluation treatment of symptomatic PAD. I last saw him in the office 02/03/17. He does have a history of CAD status post catheterization gyri 2013 showing an occluded RCA with normal EF. His cardiac vascular resector profile is notable for essential hypertension and hyperlipidemia. He smoked remotely. He does have a scotch every evening which she enjoys. He complains of right much greater than the left lower extremity claudication which is lifestyle limiting over the last several years especially when walking up a hill. He does have a small abdominal aortic caries and measuring 3.6 cm both by duplex ultrasound and recent CTA. I performed duplex imaging on 02/22/17 revealing a high-grade distal right common leg artery stenosis with monophasic waveforms below and much less severe disease on the left. He underwent angiography by myself 03/27/17 revealing a small distal abdominal aortic aneurysm and an occluded distal right and common femoral arteryextending into the profunda and proximal SFA which was calcified. He underwent right common femoral endarterectomy with patch angioplasty by Dr. Scot Dock 04/06/17 with an excellent result was discharged the following day.     Current Meds  Medication Sig  . aspirin EC 81 MG tablet Take 1 tablet (81 mg total) by mouth daily.  Marland Kitchen atorvastatin (LIPITOR) 80 MG tablet Take 1 tablet (80 mg total) by mouth daily.  . cetirizine (ZYRTEC) 10 MG tablet Take 10 mg by mouth as needed for allergies.   . Cholecalciferol (VITAMIN D3) 2000 UNITS capsule Take 2,000 Units by mouth daily.    . cilostazol (PLETAL) 50 MG tablet TAKE 1 TABLET TWICE DAILY (Patient taking differently: TAKE 1  TABLET (50mg ) TWICE DAILY)  . CINNAMON PO Take 1 tablet by mouth daily.   . Coenzyme Q10 (COQ-10 PO) Take 1 tablet by mouth daily.    Marland Kitchen escitalopram (LEXAPRO) 10 MG tablet Take 10 mg by mouth daily.   Marland Kitchen ibuprofen (ADVIL,MOTRIN) 200 MG tablet Take 400 mg by mouth daily as needed for moderate pain.  . isosorbide mononitrate (IMDUR) 60 MG 24 hr tablet TAKE 1 TABLET EVERY DAY (Patient taking differently: TAKE 1 TABLET (60mg ) EVERY DAY)  . losartan (COZAAR) 50 MG tablet Take 25 mg by mouth daily.   . Multiple Vitamin (MULTIVITAMIN WITH MINERALS) TABS tablet Take 1 tablet by mouth daily.  . naproxen sodium (ALEVE) 220 MG tablet Take 220 mg by mouth daily as needed (pain).  Marland Kitchen oxyCODONE-acetaminophen (PERCOCET/ROXICET) 5-325 MG tablet Take 1 tablet by mouth every 6 (six) hours as needed for moderate pain.  Marland Kitchen zolpidem (AMBIEN) 5 MG tablet Take 5 mg by mouth at bedtime.      No Known Allergies  Social History   Socioeconomic History  . Marital status: Married    Spouse name: Not on file  . Number of children: Not on file  . Years of education: Not on file  . Highest education level: Not on file  Social Needs  . Financial resource strain: Not on file  . Food insecurity - worry: Not on file  . Food insecurity - inability: Not on file  . Transportation needs - medical: Not on file  . Transportation needs - non-medical: Not on file  Occupational History  . Not  on file  Tobacco Use  . Smoking status: Former Smoker    Last attempt to quit: 02/29/1992    Years since quitting: 25.1  . Smokeless tobacco: Never Used  Substance and Sexual Activity  . Alcohol use: Yes    Alcohol/week: 3.5 oz    Types: 7 drink(s) per week  . Drug use: No  . Sexual activity: Not on file  Other Topics Concern  . Not on file  Social History Narrative  . Not on file     Review of Systems: General: negative for chills, fever, night sweats or weight changes.  Cardiovascular: negative for chest pain, dyspnea on  exertion, edema, orthopnea, palpitations, paroxysmal nocturnal dyspnea or shortness of breath Dermatological: negative for rash Respiratory: negative for cough or wheezing Urologic: negative for hematuria Abdominal: negative for nausea, vomiting, diarrhea, bright red blood per rectum, melena, or hematemesis Neurologic: negative for visual changes, syncope, or dizziness All other systems reviewed and are otherwise negative except as noted above.    Blood pressure 132/64, pulse 79, height 6' (1.829 m), weight 208 lb (94.3 kg).  General appearance: alert and no distress Neck: no adenopathy, no carotid bruit, no JVD, supple, symmetrical, trachea midline and thyroid not enlarged, symmetric, no tenderness/mass/nodules Lungs: clear to auscultation bilaterally Heart: regular rate and rhythm, S1, S2 normal, no murmur, click, rub or gallop Extremities: extremities normal, atraumatic, no cyanosis or edema Pulses: 2+ and symmetric Skin: Skin color, texture, turgor normal. No rashes or lesions Neurologic: Alert and oriented X 3, normal strength and tone. Normal symmetric reflexes. Normal coordination and gait  EKG not performed today  ASSESSMENT AND PLAN:   PAD (peripheral artery disease) (HCC) History of PAD with severe lifestyle limiting claudication status post angiography by myself 03/27/17 revealing a calcified occluded distal right common femoral artery extending into the profunda and proximal right SFA. He underwent right common femoral endarterectomy with patch angioplasty by Dr. Scot Dock on 04/06/17 with excellent result. He is slowly healing. Does have a palpable pedal pulse. Does have some edema in his groin but no bruit. Will get lower extremity arterial Doppler studies done office and I will see him back in 3 months for follow-up.      Lorretta Harp MD FACP,FACC,FAHA, [facility] 04/18/2017 9:43 AM

## 2017-04-18 NOTE — Assessment & Plan Note (Signed)
History of PAD with severe lifestyle limiting claudication status post angiography by myself 03/27/17 revealing a calcified occluded distal right common femoral artery extending into the profunda and proximal right SFA. He underwent right common femoral endarterectomy with patch angioplasty by Dr. Scot Dock on 04/06/17 with excellent result. He is slowly healing. Does have a palpable pedal pulse. Does have some edema in his groin but no bruit. Will get lower extremity arterial Doppler studies done office and I will see him back in 3 months for follow-up.

## 2017-04-20 ENCOUNTER — Encounter: Payer: Self-pay | Admitting: Internal Medicine

## 2017-04-28 ENCOUNTER — Encounter: Payer: Self-pay | Admitting: Internal Medicine

## 2017-05-03 ENCOUNTER — Ambulatory Visit (INDEPENDENT_AMBULATORY_CARE_PROVIDER_SITE_OTHER): Payer: Medicare HMO | Admitting: Vascular Surgery

## 2017-05-03 ENCOUNTER — Other Ambulatory Visit: Payer: Self-pay

## 2017-05-03 ENCOUNTER — Encounter: Payer: Self-pay | Admitting: Vascular Surgery

## 2017-05-03 VITALS — BP 117/77 | HR 72 | Temp 98.0°F | Resp 18 | Ht 72.0 in | Wt 205.0 lb

## 2017-05-03 DIAGNOSIS — Z48812 Encounter for surgical aftercare following surgery on the circulatory system: Secondary | ICD-10-CM

## 2017-05-03 NOTE — Progress Notes (Signed)
Patient name: Manuel Friedman MRN: [id number] DOB: [birth year] Sex: male  REASON FOR VISIT:   Follow-up after recent surgery.  HPI:   Manuel Friedman is a pleasant 77 y.o. male who presented with disabling claudication of the right lower extremity.  He underwent a arteriogram by Dr. Quay Burow and had a large bulky calcific plaque in the common femoral artery.  Below that he had no significant infrainguinal arterial occlusive disease.  He underwent a right common femoral artery and superficial femoral artery endarterectomy with vein patch angioplasty using right great saphenous vein.  He did well postoperatively and was discharged on postoperative day #1.  He returns for his first outpatient visit.  He has no specific complaints.  His right lower extremity claudication symptoms have resolved.  He has no symptoms in the left leg.  He is not a smoker.  The patient also has a known 3.6 cm infrarenal abdominal aortic aneurysm.  The last study that I could find was July 2017.  Current Outpatient Medications  Medication Sig Dispense Refill  . aspirin EC 81 MG tablet Take 1 tablet (81 mg total) by mouth daily. 150 tablet 2  . atorvastatin (LIPITOR) 80 MG tablet Take 1 tablet (80 mg total) by mouth daily. 90 tablet 3  . cetirizine (ZYRTEC) 10 MG tablet Take 10 mg by mouth as needed for allergies.     . Cholecalciferol (VITAMIN D3) 2000 UNITS capsule Take 2,000 Units by mouth daily.      Marland Kitchen CINNAMON PO Take 1 tablet by mouth daily.     . Coenzyme Q10 (COQ-10 PO) Take 1 tablet by mouth daily.      Marland Kitchen escitalopram (LEXAPRO) 10 MG tablet Take 10 mg by mouth daily.     . isosorbide mononitrate (IMDUR) 60 MG 24 hr tablet TAKE 1 TABLET EVERY DAY (Patient taking differently: TAKE 1 TABLET (60mg ) EVERY DAY) 90 tablet 3  . losartan (COZAAR) 50 MG tablet Take 25 mg by mouth daily.     . Multiple Vitamin (MULTIVITAMIN WITH MINERALS) TABS tablet Take 1 tablet by mouth daily.    . naproxen sodium (ALEVE)  220 MG tablet Take 220 mg by mouth daily as needed (pain).    Marland Kitchen zolpidem (AMBIEN) 5 MG tablet Take 5 mg by mouth at bedtime.     Marland Kitchen ibuprofen (ADVIL,MOTRIN) 200 MG tablet Take 400 mg by mouth daily as needed for moderate pain.    Marland Kitchen oxyCODONE-acetaminophen (PERCOCET/ROXICET) 5-325 MG tablet Take 1 tablet by mouth every 6 (six) hours as needed for moderate pain. (Patient not taking: Reported on 05/03/2017) 20 tablet 0   No current facility-administered medications for this visit.    Facility-Administered Medications Ordered in Other Visits  Medication Dose Route Frequency Provider Last Rate Last Dose  . 0.9 %  sodium chloride infusion  250 mL Intravenous PRN Lelon Perla, MD      . sodium chloride 0.9 % injection 3 mL  3 mL Intravenous Q12H Stanford Breed, Denice Bors, MD      . sodium chloride 0.9 % injection 3 mL  3 mL Intravenous PRN Stanford Breed Denice Bors, MD        REVIEW OF SYSTEMS:  [X]  denotes positive finding, [ ]  denotes negative finding Cardiac  Comments:  Chest pain or chest pressure:    Shortness of breath upon exertion:    Short of breath when lying flat:    Irregular heart rhythm:    Constitutional    Fever or chills:  PHYSICAL EXAM:   Vitals:   05/03/17 1124  BP: 117/77  Pulse: 72  Resp: 18  Temp: 98 F (36.7 C)  TempSrc: Oral  SpO2: 96%  Weight: 205 lb (93 kg)  Height: 6' (1.829 m)    GENERAL: The patient is a well-nourished male, in no acute distress. The vital signs are documented above. CARDIOVASCULAR: There is a regular rate and rhythm. PULMONARY: There is good air exchange bilaterally without wheezing or rales. His right groin incision is healing nicely. He has palpable femoral, popliteal, and posterior tibial pulses bilaterally. He has mild right lower extremity swelling.  DATA:   His postoperative ABI in the hospital was 100% on the right.  MEDICAL ISSUES:   STATUS POST RIGHT COMMON FEMORAL AND SUPERFICIAL FEMORAL ARTERY ENDARTERECTOMY WITH VEIN PATCH  ANGIOPLASTY: The patient is doing well status post endarterectomy of the right common femoral artery.  He has a palpable posterior tibial pulse and an ABI of 100%.  His symptoms are resolved.  I am encouraged him to stay as active as possible.  Fortunately he is not a smoker.  I will see him back in 1 year.  He knows to call sooner if he has problems.  ABDOMINAL AORTIC ANEURYSM: He has a small 3.6 cm infrarenal abdominal aortic aneurysm.  He is trying to decide if he would like to continue follow-up with Dr. Quay Burow or to follow-up here.  Certainly I am fine with either.  If he decides to follow-up here that I will arrange a follow-up duplex of his abdominal aortic aneurysm in 1 year.  Given the small size of the aneurysm in 2017 I think this is reasonable.  Deitra Mayo [facility] [phone]

## 2017-05-04 ENCOUNTER — Ambulatory Visit (HOSPITAL_COMMUNITY)
Admission: RE | Admit: 2017-05-04 | Payer: Medicare HMO | Source: Ambulatory Visit | Attending: Cardiovascular Disease | Admitting: Cardiovascular Disease

## 2017-05-04 ENCOUNTER — Ambulatory Visit (HOSPITAL_COMMUNITY): Payer: Medicare HMO

## 2017-05-17 ENCOUNTER — Ambulatory Visit: Payer: Medicare HMO | Admitting: Cardiovascular Disease

## 2017-05-23 ENCOUNTER — Ambulatory Visit: Payer: Medicare HMO | Admitting: Cardiovascular Disease

## 2017-06-19 ENCOUNTER — Telehealth: Payer: Self-pay

## 2017-06-19 ENCOUNTER — Ambulatory Visit (AMBULATORY_SURGERY_CENTER): Payer: Self-pay

## 2017-06-19 VITALS — Ht 72.0 in | Wt 210.8 lb

## 2017-06-19 DIAGNOSIS — Z86010 Personal history of colonic polyps: Secondary | ICD-10-CM

## 2017-06-19 NOTE — Telephone Encounter (Signed)
Dr Carlean Purl, This pt is scheduled for his colon on 06/28/17. During the PV, the pt states he had recent endarterectomy on 04/06/17,for a blockage on his right thigh.  Is pt ok to proceed with the colon or does he need to reschedule his colon at a later time. Please advise.  Thanks, Gwyndolyn Saxon

## 2017-06-19 NOTE — Telephone Encounter (Signed)
I think it is fine to continue as planned

## 2017-06-19 NOTE — Progress Notes (Signed)
Per pt, no allergies to soy or egg products.Pt not taking any weight loss meds or using  O2 at home.  Pt refused emmi video. 

## 2017-06-19 NOTE — Telephone Encounter (Signed)
OK for colon per Dr Carlean Purl. Will proceed as scheduled. Gwyndolyn Saxon

## 2017-06-20 ENCOUNTER — Encounter: Payer: Self-pay | Admitting: Internal Medicine

## 2017-06-28 ENCOUNTER — Encounter: Payer: Self-pay | Admitting: Internal Medicine

## 2017-06-28 ENCOUNTER — Other Ambulatory Visit: Payer: Self-pay

## 2017-06-28 ENCOUNTER — Ambulatory Visit (AMBULATORY_SURGERY_CENTER): Payer: Medicare HMO | Admitting: Internal Medicine

## 2017-06-28 VITALS — BP 112/56 | HR 72 | Temp 96.2°F | Resp 26 | Ht 72.0 in | Wt 210.0 lb

## 2017-06-28 DIAGNOSIS — D128 Benign neoplasm of rectum: Secondary | ICD-10-CM | POA: Diagnosis not present

## 2017-06-28 DIAGNOSIS — D129 Benign neoplasm of anus and anal canal: Secondary | ICD-10-CM

## 2017-06-28 DIAGNOSIS — J449 Chronic obstructive pulmonary disease, unspecified: Secondary | ICD-10-CM | POA: Diagnosis not present

## 2017-06-28 DIAGNOSIS — Z86010 Personal history of colonic polyps: Secondary | ICD-10-CM | POA: Diagnosis present

## 2017-06-28 DIAGNOSIS — I2510 Atherosclerotic heart disease of native coronary artery without angina pectoris: Secondary | ICD-10-CM | POA: Diagnosis not present

## 2017-06-28 MED ORDER — SODIUM CHLORIDE 0.9 % IV SOLN: 500.0000 mL | Freq: Once | INTRAVENOUS | Status: DC

## 2017-06-28 NOTE — Progress Notes (Signed)
Report given to PACU, vss 

## 2017-06-28 NOTE — Progress Notes (Signed)
Called to room to assist during endoscopic procedure.  Patient ID and intended procedure confirmed with present staff. Received instructions for my participation in the procedure from the performing physician.  

## 2017-06-28 NOTE — Op Note (Signed)
[facility] Patient Name: Angelina Neece Procedure Date: 06/28/2017 8:45 AM MRN: [id number] Endoscopist: Gatha Mayer , MD Age: 77 Referring MD:  Date of Birth: Jul 25, 1940 Gender: Male Account #: [id number] Procedure:                Colonoscopy Indications:              Surveillance: Personal history of adenomatous                            polyps on last colonoscopy 5 years ago Medicines:                Propofol per Anesthesia, Monitored Anesthesia Care Procedure:                Pre-Anesthesia Assessment:                           - Prior to the procedure, a History and Physical                            was performed, and patient medications and                            allergies were reviewed. The patient's tolerance of                            previous anesthesia was also reviewed. The risks                            and benefits of the procedure and the sedation                            options and risks were discussed with the patient.                            All questions were answered, and informed consent                            was obtained. Prior Anticoagulants: The patient has                            taken no previous anticoagulant or antiplatelet                            agents. ASA Grade Assessment: III - A patient with                            severe systemic disease. After reviewing the risks                            and benefits, the patient was deemed in                            satisfactory condition to undergo the procedure.  After obtaining informed consent, the colonoscope                            was passed under direct vision. Throughout the                            procedure, the patient's blood pressure, pulse, and                            oxygen saturations were monitored continuously. The                            Colonoscope was introduced through the anus and                             advanced to the the cecum, identified by                            appendiceal orifice and ileocecal valve. The                            colonoscopy was performed without difficulty. The                            patient tolerated the procedure well. The quality                            of the bowel preparation was good. The bowel                            preparation used was Miralax. The ileocecal valve,                            appendiceal orifice, and rectum were photographed. Scope In: 8:57:04 AM Scope Out: 9:13:12 AM Scope Withdrawal Time: 0 hours 12 minutes 20 seconds  Total Procedure Duration: 0 hours 16 minutes 8 seconds  Findings:                 The perianal and digital rectal examinations were                            normal. Pertinent negatives include normal prostate                            (size, shape, and consistency).                           A diminutive polyp was found in the rectum. The                            polyp was sessile. The polyp was removed with a                            cold snare. Resection and retrieval were complete.  Verification of patient identification for the                            specimen was done. Estimated blood loss was minimal.                           Multiple diverticula were found in the entire colon.                           The exam was otherwise without abnormality on                            direct and retroflexion views. Complications:            No immediate complications. Estimated Blood Loss:     Estimated blood loss was minimal. Impression:               - One diminutive polyp in the rectum, removed with                            a cold snare. Resected and retrieved.                           - Diverticulosis in the entire examined colon.                           - The examination was otherwise normal on direct                            and retroflexion  views. Recommendation:           - Patient has a contact number available for                            emergencies. The signs and symptoms of potential                            delayed complications were discussed with the                            patient. Return to normal activities tomorrow.                            Written discharge instructions were provided to the                            patient.                           - Resume previous diet.                           - Continue present medications.                           - No repeat colonoscopy due to age. Gatha Mayer, MD 06/28/2017  9:24:50 AM This report has been signed electronically.

## 2017-06-28 NOTE — Progress Notes (Signed)
Pt's states no medical or surgical changes since previsit or office visit. 

## 2017-06-28 NOTE — Patient Instructions (Addendum)
   I found and removed one tiny rectal polyp that looks benign. Do not think this means you will need another routine colonoscopy.  I will let you know.  I appreciate the opportunity to care for you. Gatha Mayer, MD, FACG YOU HAD AN ENDOSCOPIC PROCEDURE TODAY AT [facility]:   Refer to the procedure report that was given to you for any specific questions about what was found during the examination.  If the procedure report does not answer your questions, please call your gastroenterologist to clarify.  If you requested that your care partner not be given the details of your procedure findings, then the procedure report has been included in a sealed envelope for you to review at your convenience later.  YOU SHOULD EXPECT: Some feelings of bloating in the abdomen. Passage of more gas than usual.  Walking can help get rid of the air that was put into your GI tract during the procedure and reduce the bloating. If you had a lower endoscopy (such as a colonoscopy or flexible sigmoidoscopy) you may notice spotting of blood in your stool or on the toilet paper. If you underwent a bowel prep for your procedure, you may not have a normal bowel movement for a few days.  Please Note:  You might notice some irritation and congestion in your nose or some drainage.  This is from the oxygen used during your procedure.  There is no need for concern and it should clear up in a day or so.  SYMPTOMS TO REPORT IMMEDIATELY:   Following lower endoscopy (colonoscopy or flexible sigmoidoscopy):  Excessive amounts of blood in the stool  Significant tenderness or worsening of abdominal pains  Swelling of the abdomen that is new, acute  Fever of 100F or higher  For urgent or emergent issues, a gastroenterologist can be reached at any hour by calling [phone].   DIET:  We do recommend a small meal at first, but then you may proceed to your regular diet.  Drink plenty of fluids but you  should avoid alcoholic beverages for 24 hours.  ACTIVITY:  You should plan to take it easy for the rest of today and you should NOT DRIVE or use heavy machinery until tomorrow (because of the sedation medicines used during the test).    FOLLOW UP: Our staff will call the number listed on your records the next business day following your procedure to check on you and address any questions or concerns that you may have regarding the information given to you following your procedure. If we do not reach you, we will leave a message.  However, if you are feeling well and you are not experiencing any problems, there is no need to return our call.  We will assume that you have returned to your regular daily activities without incident.  If any biopsies were taken you will be contacted by phone or by letter within the next 1-3 weeks.  Please call us at [phone] if you have not heard about the biopsies in 3 weeks.    SIGNATURES/CONFIDENTIALITY: You and/or your care partner have signed paperwork which will be entered into your electronic medical record.  These signatures attest to the fact that that the information above on your After Visit Summary has been reviewed and is understood.  Full responsibility of the confidentiality of this discharge information lies with you and/or your care-partner.  Polyp and diverticulosis information given.  No repeat colonoscopy advised.

## 2017-06-29 ENCOUNTER — Telehealth: Payer: Self-pay

## 2017-06-29 NOTE — Telephone Encounter (Signed)
  Follow up Call-  Call back number 06/28/2017  Post procedure Call Back phone  # [phone]  Permission to leave phone message Yes  Some recent data might be hidden     Patient questions:  Do you have a fever, pain , or abdominal swelling? No. Pain Score  0 *  Have you tolerated food without any problems? Yes.    Have you been able to return to your normal activities? Yes.    Do you have any questions about your discharge instructions: Diet   No. Medications  No. Follow up visit  No.  Do you have questions or concerns about your Care? No.  Actions: * If pain score is 4 or above: No action needed, pain <4.

## 2017-07-03 DIAGNOSIS — E785 Hyperlipidemia, unspecified: Secondary | ICD-10-CM | POA: Diagnosis not present

## 2017-07-05 ENCOUNTER — Encounter: Payer: Self-pay | Admitting: Internal Medicine

## 2017-07-05 DIAGNOSIS — Z86010 Personal history of colonic polyps: Secondary | ICD-10-CM

## 2017-07-05 NOTE — Progress Notes (Signed)
Diminutive adenoma no recall - age

## 2017-07-18 ENCOUNTER — Ambulatory Visit: Payer: Medicare HMO | Admitting: Cardiovascular Disease

## 2017-07-18 ENCOUNTER — Encounter: Payer: Self-pay | Admitting: Cardiovascular Disease

## 2017-07-18 VITALS — BP 116/64 | HR 75 | Ht 72.5 in | Wt 207.0 lb

## 2017-07-18 DIAGNOSIS — R0989 Other specified symptoms and signs involving the circulatory and respiratory systems: Secondary | ICD-10-CM

## 2017-07-18 DIAGNOSIS — I714 Abdominal aortic aneurysm, without rupture, unspecified: Secondary | ICD-10-CM

## 2017-07-18 DIAGNOSIS — I739 Peripheral vascular disease, unspecified: Secondary | ICD-10-CM

## 2017-07-18 NOTE — Assessment & Plan Note (Signed)
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  He has three-vessel runoff bilaterally.  10 days later he had right common femoral endarterectomy with patch angioplasty by Dr. Scot Dock .  His symptoms have resolved.  He is doing well.  He has a 2+ right femoral pulse.  We will recheck lower extremity arterial Doppler studies.

## 2017-07-18 NOTE — Assessment & Plan Note (Signed)
History of a small abdominal aortic aneurysm measuring approximately 3.5 cm by duplex ultrasound 2 years ago.  We will recheck

## 2017-07-18 NOTE — Progress Notes (Signed)
07/18/2017 [name]   [birth year]  [id number]  Primary Physician [name], MD Primary Cardiologist: Lorretta Harp MD Lupe Carney, [place]  HPI:  Manuel Friedman is a 77 y.o.  married Caucasian male father of 2, grandfather of 3 granddaughters referred by Dr. Stanford Breed for evaluation treatment of symptomatic PAD. I last saw him in the office 04/18/2017. He does have a history of CAD status post catheterization gyri 2013 showing an occluded RCA with normal EF.His cardiac vascular resector profile is notable for essentialhypertension and hyperlipidemia. He smoked remotely. He does have a scotch every evening which she enjoys. He complains of right much greater than the left lower extremity claudication which is lifestyle limiting over the last several years especially when walking up a hill. He does have a small abdominal aortic caries and measuring 3.6 cm both by duplex ultrasound and recent CTA. I performed duplex imaging on 02/22/17 revealing a high-grade distal right common leg artery stenosis with monophasic waveforms below and much less severe disease on the left. He underwent angiography by myself 03/27/17 revealing a small distal abdominal aortic aneurysm and an occluded distal right and common femoral arteryextending into the profunda and proximal SFA which was calcified. He underwent right common femoral endarterectomy with patch angioplasty by Dr. Scot Dock 04/06/17 with an excellent result was discharged the following day. Since I saw him back the office approximately 2 months ago he is to do well.  His claudication has resolved.      Current Meds  Medication Sig  . aspirin EC 81 MG tablet Take 1 tablet (81 mg total) by mouth daily.  . cetirizine (ZYRTEC) 10 MG tablet Take 10 mg by mouth as needed for allergies.   . Cholecalciferol (VITAMIN D3) 2000 UNITS capsule Take 2,000 Units by mouth daily.    Marland Kitchen CINNAMON PO Take 1 tablet by mouth daily.   . Coenzyme Q10 (COQ-10  PO) Take 1 tablet by mouth daily.    Marland Kitchen escitalopram (LEXAPRO) 10 MG tablet Take 10 mg by mouth daily.   Marland Kitchen ibuprofen (ADVIL,MOTRIN) 200 MG tablet Take 400 mg by mouth daily as needed for moderate pain.  . isosorbide mononitrate (IMDUR) 60 MG 24 hr tablet TAKE 1 TABLET EVERY DAY (Patient taking differently: TAKE 1 TABLET (60mg ) EVERY DAY)  . losartan (COZAAR) 50 MG tablet Take 25 mg by mouth daily.   . Multiple Vitamin (MULTIVITAMIN WITH MINERALS) TABS tablet Take 1 tablet by mouth daily.  . naproxen sodium (ALEVE) 220 MG tablet Take 220 mg by mouth daily as needed (pain).  Marland Kitchen oxyCODONE-acetaminophen (PERCOCET/ROXICET) 5-325 MG tablet Take 1 tablet by mouth every 6 (six) hours as needed for moderate pain.  Marland Kitchen zolpidem (AMBIEN) 5 MG tablet Take 5 mg by mouth at bedtime.    Current Facility-Administered Medications for the 07/18/17 encounter (Office Visit) with Lorretta Harp, MD  Medication  . 0.9 %  sodium chloride infusion     No Known Allergies  Social History   Socioeconomic History  . Marital status: Married    Spouse name: Not on file  . Number of children: Not on file  . Years of education: Not on file  . Highest education level: Not on file  Occupational History  . Not on file  Social Needs  . Financial resource strain: Not on file  . Food insecurity:    Worry: Not on file    Inability: Not on file  . Transportation needs:  Medical: Not on file    Non-medical: Not on file  Tobacco Use  . Smoking status: Former Smoker    Last attempt to quit: 02/29/1992    Years since quitting: 25.4  . Smokeless tobacco: Never Used  Substance and Sexual Activity  . Alcohol use: Yes    Alcohol/week: 3.5 oz    Types: 7 drink(s) per week  . Drug use: No  . Sexual activity: Not on file  Lifestyle  . Physical activity:    Days per week: Not on file    Minutes per session: Not on file  . Stress: Not on file  Relationships  . Social connections:    Talks on phone: Not on file     Gets together: Not on file    Attends religious service: Not on file    Active member of club or organization: Not on file    Attends meetings of clubs or organizations: Not on file    Relationship status: Not on file  . Intimate partner violence:    Fear of current or ex partner: Not on file    Emotionally abused: Not on file    Physically abused: Not on file    Forced sexual activity: Not on file  Other Topics Concern  . Not on file  Social History Narrative  . Not on file     Review of Systems: General: negative for chills, fever, night sweats or weight changes.  Cardiovascular: negative for chest pain, dyspnea on exertion, edema, orthopnea, palpitations, paroxysmal nocturnal dyspnea or shortness of breath Dermatological: negative for rash Respiratory: negative for cough or wheezing Urologic: negative for hematuria Abdominal: negative for nausea, vomiting, diarrhea, bright red blood per rectum, melena, or hematemesis Neurologic: negative for visual changes, syncope, or dizziness All other systems reviewed and are otherwise negative except as noted above.    Blood pressure 116/64, pulse 75, height 6' 0.5" (1.842 m), weight 207 lb (93.9 kg).  General appearance: alert and no distress Neck: no adenopathy, no JVD, supple, symmetrical, trachea midline, thyroid not enlarged, symmetric, no tenderness/mass/nodules and Soft right carotid bruit Lungs: clear to auscultation bilaterally Heart: regular rate and rhythm, S1, S2 normal, no murmur, click, rub or gallop Extremities: Trace right ankle edema Pulses: 2+ and symmetric Skin: Skin color, texture, turgor normal. No rashes or lesions Neurologic: Alert and oriented X 3, normal strength and tone. Normal symmetric reflexes. Normal coordination and gait  EKG sinus rhythm at 75 with right bundle branch block . I personally reviewed this EKG  ASSESSMENT AND PLAN:   Abdominal aortic aneurysm ([place]) History of a small abdominal aortic  aneurysm measuring approximately 3.5 cm by duplex ultrasound 2 years ago.  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  He has three-vessel runoff bilaterally.  10 days later he had right common femoral endarterectomy with patch angioplasty by Dr. Scot Dock .  His symptoms have resolved.  He is doing well.  He has a 2+ right femoral pulse.  We will recheck lower extremity arterial Doppler studies.      Lorretta Harp MD FACP,FACC,FAHA, [facility] 07/18/2017 9:27 AM

## 2017-07-18 NOTE — Patient Instructions (Signed)
Medication Instructions: Your physician recommends that you continue on your current medications as directed. Please refer to the Current Medication list given to you today.   Testing/Procedures: Your physician has requested that you have a carotid duplex. This test is an ultrasound of the carotid arteries in your neck. It looks at blood flow through these arteries that supply the brain with blood. Allow one hour for this exam. There are no restrictions or special instructions.  Your physician has requested that you have an abdominal aorta duplex. During this test, an ultrasound is used to evaluate the aorta. Allow 30 minutes for this exam. Do not eat after midnight the day before and avoid carbonated beverages  Your physician has requested that you have a lower extremity arterial duplex. During this test, ultrasound is used to evaluate arterial blood flow in the legs. Allow one hour for this exam. There are no restrictions or special instructions.  Your physician has requested that you have an ankle brachial index (ABI). During this test an ultrasound and blood pressure cuff are used to evaluate the arteries that supply the arms and legs with blood. Allow thirty minutes for this exam. There are no restrictions or special instructions.  Follow-Up: Your physician recommends that you schedule a follow-up appointment as needed with Dr. Gwenlyn Found.

## 2017-07-21 ENCOUNTER — Ambulatory Visit (HOSPITAL_COMMUNITY)
Admission: RE | Admit: 2017-07-21 | Discharge: 2017-07-21 | Disposition: A | Discharge status: Home / Self Care | Payer: Medicare HMO | Source: Ambulatory Visit | Attending: Cardiovascular Disease | Admitting: Cardiovascular Disease

## 2017-07-21 DIAGNOSIS — I739 Peripheral vascular disease, unspecified: Secondary | ICD-10-CM | POA: Insufficient documentation

## 2017-07-31 ENCOUNTER — Ambulatory Visit (HOSPITAL_COMMUNITY)
Admission: RE | Admit: 2017-07-31 | Discharge: 2017-07-31 | Disposition: A | Discharge status: Home / Self Care | Payer: Medicare HMO | Source: Ambulatory Visit | Attending: Cardiovascular Disease | Admitting: Cardiovascular Disease

## 2017-07-31 DIAGNOSIS — I714 Abdominal aortic aneurysm, without rupture, unspecified: Secondary | ICD-10-CM

## 2017-07-31 DIAGNOSIS — J449 Chronic obstructive pulmonary disease, unspecified: Secondary | ICD-10-CM | POA: Insufficient documentation

## 2017-07-31 DIAGNOSIS — R0989 Other specified symptoms and signs involving the circulatory and respiratory systems: Secondary | ICD-10-CM

## 2017-07-31 DIAGNOSIS — I739 Peripheral vascular disease, unspecified: Secondary | ICD-10-CM | POA: Insufficient documentation

## 2017-07-31 DIAGNOSIS — E785 Hyperlipidemia, unspecified: Secondary | ICD-10-CM | POA: Insufficient documentation

## 2017-07-31 DIAGNOSIS — I10 Essential (primary) hypertension: Secondary | ICD-10-CM | POA: Diagnosis not present

## 2017-07-31 DIAGNOSIS — Z87891 Personal history of nicotine dependence: Secondary | ICD-10-CM | POA: Insufficient documentation

## 2017-07-31 DIAGNOSIS — I2510 Atherosclerotic heart disease of native coronary artery without angina pectoris: Secondary | ICD-10-CM | POA: Insufficient documentation

## 2017-07-31 DIAGNOSIS — I6523 Occlusion and stenosis of bilateral carotid arteries: Secondary | ICD-10-CM | POA: Insufficient documentation

## 2017-08-01 ENCOUNTER — Other Ambulatory Visit: Payer: Self-pay | Admitting: *Deleted

## 2017-08-01 DIAGNOSIS — I714 Abdominal aortic aneurysm, without rupture, unspecified: Secondary | ICD-10-CM

## 2017-08-02 ENCOUNTER — Other Ambulatory Visit: Payer: Self-pay | Admitting: Cardiovascular Disease

## 2017-08-02 DIAGNOSIS — I739 Peripheral vascular disease, unspecified: Secondary | ICD-10-CM

## 2017-08-24 DIAGNOSIS — M25561 Pain in right knee: Secondary | ICD-10-CM | POA: Diagnosis not present

## 2017-08-24 DIAGNOSIS — R7301 Impaired fasting glucose: Secondary | ICD-10-CM | POA: Diagnosis not present

## 2017-08-24 DIAGNOSIS — I10 Essential (primary) hypertension: Secondary | ICD-10-CM | POA: Diagnosis not present

## 2017-08-24 DIAGNOSIS — I714 Abdominal aortic aneurysm, without rupture: Secondary | ICD-10-CM | POA: Diagnosis not present

## 2017-08-24 DIAGNOSIS — I7389 Other specified peripheral vascular diseases: Secondary | ICD-10-CM | POA: Diagnosis not present

## 2017-08-24 DIAGNOSIS — I2510 Atherosclerotic heart disease of native coronary artery without angina pectoris: Secondary | ICD-10-CM | POA: Diagnosis not present

## 2017-08-24 DIAGNOSIS — Z6828 Body mass index (BMI) 28.0-28.9, adult: Secondary | ICD-10-CM | POA: Diagnosis not present

## 2017-09-18 ENCOUNTER — Ambulatory Visit (INDEPENDENT_AMBULATORY_CARE_PROVIDER_SITE_OTHER): Payer: Commercial Managed Care - HMO | Admitting: Orthopaedic Surgery

## 2017-09-22 ENCOUNTER — Other Ambulatory Visit: Payer: Self-pay | Admitting: Cardiology

## 2017-09-26 ENCOUNTER — Ambulatory Visit (HOSPITAL_COMMUNITY)
Admission: EM | Admit: 2017-09-26 | Discharge: 2017-09-26 | Disposition: A | Discharge status: Home / Self Care | Payer: Medicare HMO | Attending: Family Medicine | Admitting: Family Medicine

## 2017-09-26 ENCOUNTER — Encounter (HOSPITAL_COMMUNITY): Payer: Self-pay | Admitting: Emergency Medicine

## 2017-09-26 DIAGNOSIS — H6121 Impacted cerumen, right ear: Secondary | ICD-10-CM | POA: Diagnosis not present

## 2017-09-26 NOTE — ED Triage Notes (Signed)
Pt here for cerumen impaction to right ear

## 2017-09-26 NOTE — ED Provider Notes (Signed)
[place]    CSN: [id number] Arrival date & time: 09/26/17  1347     History   Chief Complaint Chief Complaint  Patient presents with  . Cerumen Impaction    HPI Manuel Friedman is a 77 y.o. male.   77 year old male comes in for cerumen impaction to the right ear.  States his hearing aid has not been working correctly/beeping, went to get hearing aid fixed, and was told he had cerumen impaction, and most likely the cause of hearing aid malfunction. He denies and ear pain, ear drainage. Denies URI symptoms such as cough, congestion, sore throat. Denies fever, chills, night sweats.      Past Medical History:  Diagnosis Date  . Adenomatous colon polyp   . Arthritis   . CAD (coronary artery disease)    s/p cath 03/2011 with total RCA, left to right collaterals and mild nonobstructive disease in the LAD and LCX. Trying medical management but could attempt PCI of the RCA  . Cancer ([place])    skin cancer  . Cataract    Bil/had surgery  . COPD (chronic obstructive pulmonary disease) ([place])   . Decreased hearing    wears hearing aid/Bil  . Depression   . Diverticulosis   . Esophageal stricture   . Hiatal hernia   . HTN (hypertension)   . Hyperlipemia   . Internal and external hemorrhoids without complication   . Lung nodule    Noted on CTA Jan 2013. Needs follow upstudy  . Peripheral vascular disease ([place])   . Rosacea   . Swelling of ankle, right   . Umbilical hernia    hx    Patient Active Problem List   Diagnosis Date Noted  . PAD (peripheral artery disease) ([place]) 04/06/2017  . Rosacea   . Internal and external hemorrhoids without complication   . Hyperlipemia   . HTN (hypertension)   . Hiatal hernia   . Esophageal stricture   . Diverticulosis   . Depression   . Decreased hearing   . COPD (chronic obstructive pulmonary disease) ([place])   . Arthritis   . Adenomatous colon polyp   . Peripheral arterial disease ([place]) 02/03/2017  . Abdominal aortic  aneurysm ([place]) 07/04/2014  . Personal history of colonic polyps 03/27/2012  . Cerebrovascular disease 09/26/2011  . CAD (coronary artery disease) 04/18/2011  . Lung nodule 04/18/2011  . Chest pain 03/07/2011  . Hypertension 03/07/2011  . Hyperlipidemia 03/07/2011    Past Surgical History:  Procedure Laterality Date  . cataract surgery     Bil  . COLONOSCOPY W/ BIOPSIES AND POLYPECTOMY  01/30/2007   diverticulosis, internal and external hemorrhoids  . ENDARTERECTOMY FEMORAL Right 04/06/2017   Procedure: ENDARTERECTOMY RIGHT COMMON FEMORAL ARTERY;  Surgeon: Angelia Mould, MD;  Location: [facility];  Service: Vascular;  Laterality: Right;  . LOWER EXTREMITY ANGIOGRAPHY N/A 03/27/2017   Procedure: LOWER EXTREMITY ANGIOGRAPHY;  Surgeon: Lorretta Harp, MD;  Location: [facility];  Service: Cardiovascular;  Laterality: N/A;  . PATCH ANGIOPLASTY Right 04/06/2017   Procedure: PATCH ANGIOPLASTY USING RIGHT GREATER SAPHENOUS VEIN ON RIGHT COMMON FEMORAL ARTERY;  Surgeon: Angelia Mould, MD;  Location: [facility];  Service: Vascular;  Laterality: Right;  . TONSILLECTOMY    . UPPER GASTROINTESTINAL ENDOSCOPY  02/19/2009   stricture, GERD, Hiatal hernia       Home Medications    Prior to Admission medications   Medication Sig Start Date End Date Taking? Authorizing Provider  aspirin  EC 81 MG tablet Take 1 tablet (81 mg total) by mouth daily. 03/07/11   Lelon Perla, MD  atorvastatin (LIPITOR) 80 MG tablet Take 1 tablet (80 mg total) by mouth daily. 03/24/17 06/22/17  Lelon Perla, MD  cetirizine (ZYRTEC) 10 MG tablet Take 10 mg by mouth as needed for allergies.     [provider]  Cholecalciferol (VITAMIN D3) 2000 UNITS capsule Take 2,000 Units by mouth daily.      [provider]  CINNAMON PO Take 1 tablet by mouth daily.     [provider]  Coenzyme Q10 (COQ-10 PO) Take 1 tablet by mouth daily.      [provider]  escitalopram  (LEXAPRO) 10 MG tablet Take 10 mg by mouth daily.     [provider]  ibuprofen (ADVIL,MOTRIN) 200 MG tablet Take 400 mg by mouth daily as needed for moderate pain.    [provider]  isosorbide mononitrate (IMDUR) 60 MG 24 hr tablet TAKE 1 TABLET EVERY DAY 09/22/17   Lelon Perla, MD  losartan (COZAAR) 50 MG tablet Take 25 mg by mouth daily.     [provider]  Multiple Vitamin (MULTIVITAMIN WITH MINERALS) TABS tablet Take 1 tablet by mouth daily.    [provider]  naproxen sodium (ALEVE) 220 MG tablet Take 220 mg by mouth daily as needed (pain).    [provider]  oxyCODONE-acetaminophen (PERCOCET/ROXICET) 5-325 MG tablet Take 1 tablet by mouth every 6 (six) hours as needed for moderate pain. 04/06/17   Rhyne, Hulen Shouts, PA-C  zolpidem (AMBIEN) 5 MG tablet Take 5 mg by mouth at bedtime.     [provider]    Family History Family History  Problem Relation Age of Onset  . Pancreatic cancer Mother   . Multiple myeloma Father   . Colon cancer Neg Hx   . Stomach cancer Neg Hx     Social History Social History   Tobacco Use  . Smoking status: Former Smoker    Last attempt to quit: 02/29/1992    Years since quitting: 25.5  . Smokeless tobacco: Never Used  Substance Use Topics  . Alcohol use: Yes    Alcohol/week: 4.2 oz    Types: 7 drink(s) per week  . Drug use: No     Allergies   Patient has no known allergies.   Review of Systems Review of Systems  Reason unable to perform ROS: See HPI as above.     Physical Exam Triage Vital Signs ED Triage Vitals [09/26/17 1420]  Enc Vitals Group     BP 134/79     Pulse Rate 71     Resp 16     Temp 98.1 F (36.7 C)     Temp Source Oral     SpO2 96 %     Weight      Height      Head Circumference      Peak Flow      Pain Score      Pain Loc      Pain Edu?      Excl. in [place]?    No data found.  Updated Vital Signs BP 134/79 (BP Location: Left Arm)   Pulse  71   Temp 98.1 F (36.7 C) (Oral)   Resp 16   SpO2 96%   Physical Exam  Constitutional: He is oriented to person, place, and time. He appears well-developed and well-nourished. No distress.  HENT:  Head: Normocephalic and atraumatic.  No tenderness to palpation of tragus.  Left ear with hearing aid in, after removal, mild cerumen, TM not visible.  Right ear with cerumen impaction, TM not visible.  Post irrigation, bilateral ear canal without swelling, erythema.  TM visible, pearly gray, without erythema or bulging.  Eyes: Pupils are equal, round, and reactive to light. Conjunctivae are normal.  Neurological: He is alert and oriented to person, place, and time.  Skin: He is not diaphoretic.    UC Treatments / Results  Labs (all labs ordered are listed, but only abnormal results are displayed) Labs Reviewed - No data to display  EKG None  Radiology No results found.  Procedures Procedures (including critical care time)  Medications Ordered in UC Medications - No data to display  Initial Impression / Assessment and Plan / UC Course  I have reviewed the triage vital signs and the nursing notes.  Pertinent labs & imaging results that were available during my care of the patient were reviewed by me and considered in my medical decision making (see chart for details).     Cerumen removed.  Return precautions given.  Otherwise recheck as needed.  Patient expresses understanding and agrees to plan.  Final Clinical Impressions(s) / UC Diagnoses   Final diagnoses:  Impacted cerumen of right ear    ED Prescriptions    None        Ok Edwards, PA-C 09/26/17 1452

## 2017-09-26 NOTE — Discharge Instructions (Signed)
Ear wax removed. You can use hydrogen peroxide every 1-2 weeks to help prevent future build up. Recheck as needed.

## 2017-11-02 DIAGNOSIS — Z125 Encounter for screening for malignant neoplasm of prostate: Secondary | ICD-10-CM | POA: Diagnosis not present

## 2017-11-02 DIAGNOSIS — R7301 Impaired fasting glucose: Secondary | ICD-10-CM | POA: Diagnosis not present

## 2017-11-02 DIAGNOSIS — E7849 Other hyperlipidemia: Secondary | ICD-10-CM | POA: Diagnosis not present

## 2017-11-02 DIAGNOSIS — E538 Deficiency of other specified B group vitamins: Secondary | ICD-10-CM | POA: Diagnosis not present

## 2017-11-02 DIAGNOSIS — R82998 Other abnormal findings in urine: Secondary | ICD-10-CM | POA: Diagnosis not present

## 2017-11-02 DIAGNOSIS — I10 Essential (primary) hypertension: Secondary | ICD-10-CM | POA: Diagnosis not present

## 2017-11-09 DIAGNOSIS — K429 Umbilical hernia without obstruction or gangrene: Secondary | ICD-10-CM | POA: Diagnosis not present

## 2017-11-09 DIAGNOSIS — F329 Major depressive disorder, single episode, unspecified: Secondary | ICD-10-CM | POA: Diagnosis not present

## 2017-11-09 DIAGNOSIS — Z23 Encounter for immunization: Secondary | ICD-10-CM | POA: Diagnosis not present

## 2017-11-09 DIAGNOSIS — Z0000 Encounter for general adult medical examination without abnormal findings: Secondary | ICD-10-CM | POA: Diagnosis not present

## 2017-11-09 DIAGNOSIS — I7389 Other specified peripheral vascular diseases: Secondary | ICD-10-CM | POA: Diagnosis not present

## 2017-11-09 DIAGNOSIS — H16139 Photokeratitis, unspecified eye: Secondary | ICD-10-CM | POA: Diagnosis not present

## 2017-11-09 DIAGNOSIS — M25561 Pain in right knee: Secondary | ICD-10-CM | POA: Diagnosis not present

## 2017-11-09 DIAGNOSIS — I714 Abdominal aortic aneurysm, without rupture: Secondary | ICD-10-CM | POA: Diagnosis not present

## 2017-11-09 DIAGNOSIS — I6789 Other cerebrovascular disease: Secondary | ICD-10-CM | POA: Diagnosis not present

## 2017-11-09 DIAGNOSIS — R918 Other nonspecific abnormal finding of lung field: Secondary | ICD-10-CM | POA: Diagnosis not present

## 2017-12-12 DIAGNOSIS — S61411A Laceration without foreign body of right hand, initial encounter: Secondary | ICD-10-CM | POA: Diagnosis not present

## 2017-12-12 DIAGNOSIS — Z23 Encounter for immunization: Secondary | ICD-10-CM | POA: Diagnosis not present

## 2017-12-12 DIAGNOSIS — I10 Essential (primary) hypertension: Secondary | ICD-10-CM | POA: Diagnosis not present

## 2017-12-12 DIAGNOSIS — Z6828 Body mass index (BMI) 28.0-28.9, adult: Secondary | ICD-10-CM | POA: Diagnosis not present

## 2018-02-22 ENCOUNTER — Ambulatory Visit: Payer: Medicare HMO | Admitting: Family

## 2018-02-22 ENCOUNTER — Other Ambulatory Visit: Payer: Self-pay

## 2018-02-22 ENCOUNTER — Encounter: Payer: Self-pay | Admitting: Family

## 2018-02-22 ENCOUNTER — Ambulatory Visit (HOSPITAL_COMMUNITY)
Admission: RE | Admit: 2018-02-22 | Discharge: 2018-02-22 | Disposition: A | Discharge status: Home / Self Care | Payer: Medicare HMO | Source: Ambulatory Visit | Attending: Internal Medicine | Admitting: Internal Medicine

## 2018-02-22 VITALS — BP 117/69 | HR 70 | Temp 97.6°F | Resp 19 | Ht 72.5 in | Wt 208.0 lb

## 2018-02-22 DIAGNOSIS — M7989 Other specified soft tissue disorders: Secondary | ICD-10-CM

## 2018-02-22 DIAGNOSIS — M79661 Pain in right lower leg: Secondary | ICD-10-CM

## 2018-02-22 DIAGNOSIS — I739 Peripheral vascular disease, unspecified: Secondary | ICD-10-CM | POA: Diagnosis not present

## 2018-02-22 DIAGNOSIS — I779 Disorder of arteries and arterioles, unspecified: Secondary | ICD-10-CM | POA: Diagnosis not present

## 2018-02-22 NOTE — Progress Notes (Signed)
[facility]   CC: Follow up peripheral artery occlusive disease  History of Present Illness Manuel Friedman is a 77 y.o. male who presented with disabling claudication of the right lower extremity.  He underwent a arteriogram by Dr. Quay Burow and had a large bulky calcific plaque in the common femoral artery.  Below that he had no significant infrainguinal arterial occlusive disease.  He underwent a right common femoral artery and superficial femoral artery endarterectomy with vein patch angioplasty using right great saphenous vein on 04-06-17 by Dr. Scot Dock.   He did well postoperatively and was discharged on postoperative day #1.   Dr. Scot Dock last evaluated pt on 05-03-17. At that time his right lower extremity claudication symptoms had resolved.  The patient was doing well status post endarterectomy of the right common femoral artery.  He had a palpable posterior tibial pulse and an ABI of 100%.  His symptoms were resolved. Dr. Scot Dock encouraged him to stay as active as possible.  Fortunately he is not a smoker. Pt was to return in 1 year.  He knows to call sooner if he has problems. He has a small 3.6 cm infrarenal abdominal aortic aneurysm.  He is trying to decide if he would like to continue follow-up with Dr. Quay Burow or to follow-up here.  Dr. Scot Dock was fine with either.  If he decides to follow-up here, then we will arrange a follow-up duplex of his abdominal aortic aneurysm in 1 year.  Given the small size of the aneurysm in 2017, Dr. Scot Dock thought  this is reasonable.  Pt walked in the clinic today with c/o right calf pain that woke him up last night. He denies pain in his right leg with walking now. He does feel his right calf feels sore now. Pt denies any extended car or plane or other extended periods of not being able to walk. He denies any dyspnea.  He states his right calf feels slightly hard, and seems larger than his left calf. He  states his right calf did not seem enlarged yesterday.  He denies any right arm weakness, denies sudden visual changes, denies speech difficulties.  He stopped taking 81 MG ASA qod in September 2019.   He sees Dr. Ellene Route for his back problems, pt states the problem is not bad enough yet for surgery. 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  Diabetic: No Tobacco use: former smoker, quit in 1993  Pt meds include: Statin : no, pt stopped taking about September 2019 due to severe nose bleed and profuse bleeding in his right lower leg from a minor laceration.  Betablocker: No ASA: Yes Other anticoagulants/antiplatelets: no  Past Medical History:  Diagnosis Date  . Adenomatous colon polyp   . Arthritis   . CAD (coronary artery disease)    s/p cath 03/2011 with total RCA, left to right collaterals and mild nonobstructive disease in the LAD and LCX. Trying medical management but could attempt PCI of the RCA  . Cancer ([place])    skin cancer  . Cataract    Bil/had surgery  . COPD (chronic obstructive pulmonary disease) ([place])   . Decreased hearing    wears hearing aid/Bil  . Depression   . Diverticulosis   . Esophageal stricture   . Hiatal hernia   . HTN (hypertension)   . Hyperlipemia   . Internal and external hemorrhoids without complication   . Lung nodule    Noted on  CTA Jan 2013. Needs follow upstudy  . Peripheral vascular disease ([place])   . Rosacea   . Swelling of ankle, right   . Umbilical hernia    hx    Social History Social History   Tobacco Use  . Smoking status: Former Smoker    Last attempt to quit: 02/29/1992    Years since quitting: 26.0  . Smokeless tobacco: Never Used  Substance Use Topics  . Alcohol use: Yes    Alcohol/week: 7.0 standard drinks    Types: 7 drink(s) per week  . Drug use: No    Family History Family History  Problem Relation Age of Onset  . Pancreatic cancer Mother   . Multiple myeloma Father   . Colon cancer Neg Hx   .  Stomach cancer Neg Hx     Past Surgical History:  Procedure Laterality Date  . cataract surgery     Bil  . COLONOSCOPY W/ BIOPSIES AND POLYPECTOMY  01/30/2007   diverticulosis, internal and external hemorrhoids  . ENDARTERECTOMY FEMORAL Right 04/06/2017   Procedure: ENDARTERECTOMY RIGHT COMMON FEMORAL ARTERY;  Surgeon: Angelia Mould, MD;  Location: [facility];  Service: Vascular;  Laterality: Right;  . LOWER EXTREMITY ANGIOGRAPHY N/A 03/27/2017   Procedure: LOWER EXTREMITY ANGIOGRAPHY;  Surgeon: Lorretta Harp, MD;  Location: [facility];  Service: Cardiovascular;  Laterality: N/A;  . PATCH ANGIOPLASTY Right 04/06/2017   Procedure: PATCH ANGIOPLASTY USING RIGHT GREATER SAPHENOUS VEIN ON RIGHT COMMON FEMORAL ARTERY;  Surgeon: Angelia Mould, MD;  Location: [facility];  Service: Vascular;  Laterality: Right;  . TONSILLECTOMY    . UPPER GASTROINTESTINAL ENDOSCOPY  02/19/2009   stricture, GERD, Hiatal hernia    No Known Allergies  Current Outpatient Medications  Medication Sig Dispense Refill  . cetirizine (ZYRTEC) 10 MG tablet Take 10 mg by mouth as needed for allergies.     . Coenzyme Q10 (COQ-10 PO) Take 1 tablet by mouth daily.      Marland Kitchen escitalopram (LEXAPRO) 10 MG tablet Take 10 mg by mouth daily.     Marland Kitchen ibuprofen (ADVIL,MOTRIN) 200 MG tablet Take 400 mg by mouth daily as needed for moderate pain.    . isosorbide mononitrate (IMDUR) 60 MG 24 hr tablet TAKE 1 TABLET EVERY DAY 90 tablet 2  . losartan (COZAAR) 50 MG tablet Take 25 mg by mouth daily.     . Multiple Vitamin (MULTIVITAMIN WITH MINERALS) TABS tablet Take 1 tablet by mouth daily.    . naproxen sodium (ALEVE) 220 MG tablet Take 220 mg by mouth daily as needed (pain).    Marland Kitchen zolpidem (AMBIEN) 5 MG tablet Take 5 mg by mouth at bedtime.     Marland Kitchen aspirin EC 81 MG tablet Take 1 tablet (81 mg total) by mouth daily. 150 tablet 2  . atorvastatin (LIPITOR) 80 MG tablet Take 1 tablet (80 mg total) by mouth daily. 90 tablet 3    Current Facility-Administered Medications  Medication Dose Route Frequency Provider Last Rate Last Dose  . 0.9 %  sodium chloride infusion  500 mL Intravenous Once Gatha Mayer, MD       Facility-Administered Medications Ordered in Other Visits  Medication Dose Route Frequency Provider Last Rate Last Dose  . 0.9 %  sodium chloride infusion  250 mL Intravenous PRN Lelon Perla, MD      . sodium chloride 0.9 % injection 3 mL  3 mL Intravenous Q12H Stanford Breed Denice Bors, MD      .  sodium chloride 0.9 % injection 3 mL  3 mL Intravenous PRN Lelon Perla, MD        ROS: See HPI for pertinent positives and negatives.   Physical Examination  Vitals:   02/22/18 1145 02/22/18 1154  BP: 119/70 117/69  Pulse: 70   Resp: 19   Temp: 97.6 F (36.4 C)   SpO2: 97%   Weight: 208 lb (94.3 kg)   Height: 6' 0.5" (1.842 m)    Body mass index is 27.82 kg/m.  General: A&O x 3, WDWN, elderly male. Gait: normal HENT: No gross abnormalities.  Eyes: PERRLA. Pulmonary: Respirations are non labored, CTAB, good air movement in all fields Cardiac: regular rhythm, no detected murmur.         Carotid Bruits Right Left   Negative Negative   Radial pulses are 1+ palpable bilaterally   Adominal aortic pulse is not palpable                         VASCULAR EXAM: Extremities without ischemic changes, without Gangrene; without open wounds. Right calf is slightly larger than left calf, no edema, no erythema. No tenderness to palpation of right calf. Negative Homan's sign.                                                                                                           LE Pulses Right Left       FEMORAL  3+ palpable  2+ palpable        POPLITEAL  1+ palpable   1+ palpable       POSTERIOR TIBIAL  3+ palpable   2+ palpable        DORSALIS PEDIS      ANTERIOR TIBIAL 2+ palpable  1+ palpable    Abdomen: soft, NT, no palpable masses. Skin: no rashes, no cellulitis, no ulcers  noted. Musculoskeletal: no muscle wasting or atrophy.  Neurologic: A&O X 3; appropriate affect, Sensation is normal; MOTOR FUNCTION:  moving all extremities equally, motor strength 5/5 throughout. Speech is fluent/normal. CN 2-12 intact. Psychiatric: Thought content is normal, mood appropriate for clinical situation.     ASSESSMENT: Manuel Friedman is a 77 y.o. male who is s/p arteriogram by Dr. Quay Burow and had a large bulky calcific plaque in the common femoral artery.  Below that he had no significant infrainguinal arterial occlusive disease.  He underwent a right common femoral artery and superficial femoral artery endarterectomy with vein patch angioplasty using right great saphenous vein on 04-06-17 by Dr. Scot Dock.    He walked in the clinic today with sudden onset right calf pain last night that woke him up, no extended periods of immobility, no dyspnea. His calf remains slightly sore now, and feels slightly enlarged, no erythema, no edema. See Plan.   ABI's today are normal bilaterally.   DATA   ABI (Date: 02/22/2018): ABI Findings: +---------+------------------+-----+---------+--------+ Right  Rt Pressure (mmHg)IndexWaveform Comment  +---------+------------------+-----+---------+--------+ Brachial 144                     +---------+------------------+-----+---------+--------+  ATA   172        1.19 triphasic     +---------+------------------+-----+---------+--------+ PTA   167        1.16 triphasic     +---------+------------------+-----+---------+--------+ Great Toe94        0.65           +---------+------------------+-----+---------+--------+  +---------+------------------+-----+---------+-------+ Left   Lt Pressure (mmHg)IndexWaveform Comment +---------+------------------+-----+---------+-------+ Brachial 144                      +---------+------------------+-----+---------+-------+ ATA   141        0.98 triphasic     +---------+------------------+-----+---------+-------+ PTA   148        1.03 triphasic     +---------+------------------+-----+---------+-------+ Great Toe83        0.58           +---------+------------------+-----+---------+-------+  +-------+-----------+-----------+------------+------------+ ABI/TBIToday's ABIToday's TBIPrevious ABIPrevious TBI +-------+-----------+-----------+------------+------------+ Right 1.19    0.65                 +-------+-----------+-----------+------------+------------+ Left  1.03    0.58                 +-------+-----------+-----------+------------+------------+  Summary: Right: Resting right ankle-brachial index is within normal range. No evidence of significant right lower extremity arterial disease. The right toe-brachial index is abnormal. RT great toe pressure = 94 mmHg.  Left: Resting left ankle-brachial index is within normal range. No evidence of significant left lower extremity arterial disease. The left toe-brachial index is abnormal. LT Great toe pressure = 83 mmHg.    CTA AO BIFEM (01-12-17) requested by Dr. Stanford Breed for right calf pain when walking: 1. 3.3 cm fusiform infrarenal abdominal aortic aneurysm. Recommend followup by ultrasound in 3 years. This recommendation follows ACR consensus guidelines: White Paper of the ACR Incidental Findings Committee II on Vascular Findings. J Am Coll Radiol 2013; 10:789-794. 2. Tortuous bilateral iliac arterial systems without stenosis or aneurysm. 3. Calcified plaque across the RIGHT common femoral artery bifurcation into the proximal SFA resulting in stenosis of probable hemodynamic significance. Correlate with segmental pressures. 4. Mild eccentric calcified plaque in the LEFT common femoral  artery across its bifurcation, without definite significant stenosis. 5. Contiguous three-vessel tibial runoff bilaterally.  NON-VASCULAR  1. Colonic diverticulosis. 2. Prostatic enlargement. 3. Multilevel lumbar spondylitic change.   PLAN:  Based on the patient's vascular studies and examination, pt will return to clinic tomorrow for right LE DVT duplex and see me at 3:15.   I advised pt to call 911 if he develops shortness of breath.   I discussed in depth with the patient the nature of atherosclerosis, and emphasized the importance of maximal medical management including strict control of blood pressure, blood glucose, and lipid levels, obtaining regular exercise, and continued cessation of smoking.  The patient is aware that without maximal medical management the underlying atherosclerotic disease process will progress, limiting the benefit of any interventions.  The patient was given information about PAD including signs, symptoms, treatment, what symptoms should prompt the patient to seek immediate medical care, and risk reduction measures to take.  Clemon Chambers, RN, MSN, FNP-C [facility] Phone: [phone]  Clinic MD: Oneida Alar  02/22/18 12:20 PM

## 2018-02-22 NOTE — Patient Instructions (Signed)

## 2018-02-23 ENCOUNTER — Ambulatory Visit (INDEPENDENT_AMBULATORY_CARE_PROVIDER_SITE_OTHER): Payer: Medicare HMO | Admitting: Family

## 2018-02-23 ENCOUNTER — Ambulatory Visit (HOSPITAL_COMMUNITY)
Admission: RE | Admit: 2018-02-23 | Discharge: 2018-02-23 | Disposition: A | Discharge status: Home / Self Care | Payer: Medicare HMO | Source: Ambulatory Visit | Attending: Internal Medicine | Admitting: Internal Medicine

## 2018-02-23 ENCOUNTER — Encounter: Payer: Self-pay | Admitting: Family

## 2018-02-23 VITALS — BP 106/66 | HR 88 | Temp 97.6°F | Resp 14 | Ht 72.5 in | Wt 208.0 lb

## 2018-02-23 DIAGNOSIS — M7989 Other specified soft tissue disorders: Secondary | ICD-10-CM

## 2018-02-23 DIAGNOSIS — I714 Abdominal aortic aneurysm, without rupture, unspecified: Secondary | ICD-10-CM

## 2018-02-23 DIAGNOSIS — I779 Disorder of arteries and arterioles, unspecified: Secondary | ICD-10-CM | POA: Diagnosis not present

## 2018-02-23 NOTE — Progress Notes (Signed)
[facility] [place]   CC: Follow up right leg soreness and calf swelling, hx of peripheral artery occlusive disease and small AAA  History of Present Illness Manuel Friedman is a 77 y.o. male who presented with disabling claudication of the right lower extremity. He underwent a arteriogram by Dr. Quay Burow and had a large bulky calcific plaque in the common femoral artery. Below that he had no significant infrainguinal arterial occlusive disease. He underwent a right common femoral artery and superficial femoral artery endarterectomy with vein patch angioplasty using right great saphenous vein on 04-06-17 by Dr. Scot Dock.  He did well postoperatively and was discharged on postoperative day #1.   Dr. Scot Dock last evaluated pt on 05-03-17. At that time his right lower extremity claudication symptoms had resolved.  The patient was doing well status post endarterectomy of the right common femoral artery. He had a palpable posterior tibial pulse and an ABI of 100%. His symptoms were resolved. Dr. Scot Dock encouraged him to stay as active as possible. Fortunately he is not a smoker. Pt was to return in 1 year. He knows to call sooner if he has problems. He has a small 3.6 cm infrarenal abdominal aortic aneurysm. He is trying to decide if he would like to continue follow-up with Dr. Quay Burow or to follow-up here. Dr. Scot Dock was fine with either. If he decides to follow-up here, then we will arrange a follow-up duplex of his abdominal aortic aneurysm in 1 year. Given the small size of the aneurysm in 2017, Dr. Scot Dock thought  this is reasonable.  Pt walked in the clinic yesterday with c/o right calf pain that woke him up last night. He denies pain in his right leg with walking now. He does feel his right calf feels sore now. Pt denies any extended car or plane or other extended periods of not being able to walk. He denies any dyspnea.  He states his right calf  feels slightly hard, and seems larger than his left calf. He states his right calf did not seem enlarged two days ago.  He denies any right arm weakness, denies sudden visual changes, denies speech difficulties.  He stopped taking 81 mg ASA qod in September 2019.   He sees Dr. Ellene Route for his back problems, pt states the problem is not bad enough yet for surgery. 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  Diabetic: No Tobacco use: former smoker, quit in 1993  Pt meds include: Statin : no, pt stopped taking about September 2019 due to severe nose bleed and profuse bleeding in his right lower leg from a minor laceration.  Betablocker: No ASA: Yes Other anticoagulants/antiplatelets: no    Past Medical History:  Diagnosis Date  . Adenomatous colon polyp   . Arthritis   . CAD (coronary artery disease)    s/p cath 03/2011 with total RCA, left to right collaterals and mild nonobstructive disease in the LAD and LCX. Trying medical management but could attempt PCI of the RCA  . Cancer ([place])    skin cancer  . Cataract    Bil/had surgery  . COPD (chronic obstructive pulmonary disease) ([place])   . Decreased hearing    wears hearing aid/Bil  . Depression   . Diverticulosis   . Esophageal stricture   . Hiatal hernia   . HTN (hypertension)   . Hyperlipemia   . Internal and external hemorrhoids without complication   . Lung nodule  Noted on CTA Jan 2013. Needs follow upstudy  . Peripheral vascular disease ([place])   . Rosacea   . Swelling of ankle, right   . Umbilical hernia    hx    Social History Social History   Tobacco Use  . Smoking status: Former Smoker    Last attempt to quit: 02/29/1992    Years since quitting: 26.0  . Smokeless tobacco: Never Used  Substance Use Topics  . Alcohol use: Yes    Alcohol/week: 7.0 standard drinks    Types: 7 drink(s) per week  . Drug use: No    Family History Family History  Problem Relation Age of Onset  . Pancreatic  cancer Mother   . Multiple myeloma Father   . Colon cancer Neg Hx   . Stomach cancer Neg Hx     Past Surgical History:  Procedure Laterality Date  . cataract surgery     Bil  . COLONOSCOPY W/ BIOPSIES AND POLYPECTOMY  01/30/2007   diverticulosis, internal and external hemorrhoids  . ENDARTERECTOMY FEMORAL Right 04/06/2017   Procedure: ENDARTERECTOMY RIGHT COMMON FEMORAL ARTERY;  Surgeon: Angelia Mould, MD;  Location: [facility];  Service: Vascular;  Laterality: Right;  . LOWER EXTREMITY ANGIOGRAPHY N/A 03/27/2017   Procedure: LOWER EXTREMITY ANGIOGRAPHY;  Surgeon: Lorretta Harp, MD;  Location: [facility];  Service: Cardiovascular;  Laterality: N/A;  . PATCH ANGIOPLASTY Right 04/06/2017   Procedure: PATCH ANGIOPLASTY USING RIGHT GREATER SAPHENOUS VEIN ON RIGHT COMMON FEMORAL ARTERY;  Surgeon: Angelia Mould, MD;  Location: [facility];  Service: Vascular;  Laterality: Right;  . TONSILLECTOMY    . UPPER GASTROINTESTINAL ENDOSCOPY  02/19/2009   stricture, GERD, Hiatal hernia    No Known Allergies  Current Outpatient Medications  Medication Sig Dispense Refill  . aspirin EC 81 MG tablet Take 1 tablet (81 mg total) by mouth daily. 150 tablet 2  . cetirizine (ZYRTEC) 10 MG tablet Take 10 mg by mouth as needed for allergies.     . Coenzyme Q10 (COQ-10 PO) Take 1 tablet by mouth daily.      Marland Kitchen escitalopram (LEXAPRO) 10 MG tablet Take 10 mg by mouth daily.     Marland Kitchen ibuprofen (ADVIL,MOTRIN) 200 MG tablet Take 400 mg by mouth daily as needed for moderate pain.    . isosorbide mononitrate (IMDUR) 60 MG 24 hr tablet TAKE 1 TABLET EVERY DAY 90 tablet 2  . losartan (COZAAR) 50 MG tablet Take 25 mg by mouth daily.     . Multiple Vitamin (MULTIVITAMIN WITH MINERALS) TABS tablet Take 1 tablet by mouth daily.    . naproxen sodium (ALEVE) 220 MG tablet Take 220 mg by mouth daily as needed (pain).    Marland Kitchen zolpidem (AMBIEN) 5 MG tablet Take 5 mg by mouth at bedtime.     Marland Kitchen atorvastatin (LIPITOR)  80 MG tablet Take 1 tablet (80 mg total) by mouth daily. 90 tablet 3   Current Facility-Administered Medications  Medication Dose Route Frequency Provider Last Rate Last Dose  . 0.9 %  sodium chloride infusion  500 mL Intravenous Once Gatha Mayer, MD       Facility-Administered Medications Ordered in Other Visits  Medication Dose Route Frequency Provider Last Rate Last Dose  . 0.9 %  sodium chloride infusion  250 mL Intravenous PRN Lelon Perla, MD      . sodium chloride 0.9 % injection 3 mL  3 mL Intravenous Q12H Stanford Breed Denice Bors, MD      .  sodium chloride 0.9 % injection 3 mL  3 mL Intravenous PRN Lelon Perla, MD        ROS: See HPI for pertinent positives and negatives.   Physical Examination  Vitals:   02/23/18 1421 02/23/18 1428  BP: 115/63 106/66  Pulse: 88   Resp: 14   Temp: 97.6 F (36.4 C)   SpO2: 97%   Weight: 208 lb (94.3 kg)   Height: 6' 0.5" (1.842 m)    Body mass index is 27.82 kg/m.  General: A&O x 3, WDWN, elderly male. Gait: normal HENT: No gross abnormalities.  Eyes: PERRLA. Pulmonary: Respirations are non labored, CTAB, good air movement in all fields Cardiac: regular rhythm, no detected murmur.         Carotid Bruits Right Left   Negative Negative   Radial pulses are 1+ palpable bilaterally   Adominal aortic pulse is not palpable                         VASCULAR EXAM: Extremities without ischemic changes, without Gangrene; without open wounds. Right calf is no longer slightly larger than left calf as it was yesterday, no edema, no erythema. No tenderness to palpation of right calf. Negative Homan's sign.                                                                                                                                                        LE Pulses Right Left       FEMORAL  3+ palpable  2+ palpable        POPLITEAL  1+ palpable   1+ palpable       POSTERIOR TIBIAL  3+ palpable   2+ palpable         DORSALIS PEDIS      ANTERIOR TIBIAL 2+ palpable  1+ palpable    Abdomen: soft, NT, no palpable masses. Skin: no rashes, no cellulitis, no ulcers noted. Musculoskeletal: no muscle wasting or atrophy.      Neurologic: A&O X 3; appropriate affect, Sensation is normal; MOTOR FUNCTION:  moving all extremities equally, motor strength 5/5 throughout. Speech is fluent/normal. CN 2-12 intact. Psychiatric: Thought content is normal, mood appropriate for clinical situation.    ASSESSMENT: Manuel Friedman is a 77 y.o. male who is s/p arteriogram by Dr. Quay Burow and had a large bulky calcific plaque in the common femoral artery. Below that he had no significant infrainguinal arterial occlusive disease. He underwent a right common femoral artery and superficial femoral artery endarterectomy with vein patch angioplasty using right great saphenous vein on 04-06-17 by Dr. Scot Dock.   He walked in the clinic yesterday with sudden onset right calf pain last night that woke him up, no extended periods of immobility, no dyspnea. His calf remains  slightly sore now, and felt slightly enlarged yesterday but not today, no erythema, no edema. No DVT nor SVT found on right leg venous duplex today.  ABI's on 02-22-18 were normal bilaterally.   He also has a small AAA.      DATA  Right LE Venous Duplex (02-23-18): Summary: Right: There is no evidence of deep vein thrombosis in the lower extremity.There is no evidence of superficial venous thrombosis. Left: No evidence of common femoral vein obstruction.     ABI (Date: 02/22/2018): ABI Findings: +---------+------------------+-----+---------+--------+ Right  Rt Pressure (mmHg)IndexWaveform Comment  +---------+------------------+-----+---------+--------+ Brachial 144                     +---------+------------------+-----+---------+--------+ ATA   172        1.19 triphasic      +---------+------------------+-----+---------+--------+ PTA   167        1.16 triphasic     +---------+------------------+-----+---------+--------+ Great Toe94        0.65           +---------+------------------+-----+---------+--------+  +---------+------------------+-----+---------+-------+ Left   Lt Pressure (mmHg)IndexWaveform Comment +---------+------------------+-----+---------+-------+ Brachial 144                     +---------+------------------+-----+---------+-------+ ATA   141        0.98 triphasic     +---------+------------------+-----+---------+-------+ PTA   148        1.03 triphasic     +---------+------------------+-----+---------+-------+ Great Toe83        0.58           +---------+------------------+-----+---------+-------+  +-------+-----------+-----------+------------+------------+ ABI/TBIToday's ABIToday's TBIPrevious ABIPrevious TBI +-------+-----------+-----------+------------+------------+ Right 1.19    0.65                 +-------+-----------+-----------+------------+------------+ Left  1.03    0.58                 +-------+-----------+-----------+------------+------------+  Summary: Right: Resting right ankle-brachial index is within normal range. No evidence of significant right lower extremity arterial disease. The right toe-brachial index is abnormal. RT great toe pressure = 94 mmHg.  Left: Resting left ankle-brachial index is within normal range. No evidence of significant left lower extremity arterial disease. The left toe-brachial index is abnormal. LT Great toe pressure = 83 mmHg.   Carotid Duplex (07-31-17):  Final Interpretation: Right Carotid: Velocities in the right ICA are consistent with a 1-39% stenosis.        Non-hemodynamically significant  plaque <50% noted in the CCA. The        RICA velocities remain within normal range and have decreased        compared to the prior exam.  Left Carotid: Velocities in the left ICA are consistent with a 1-39% stenosis.       Non-hemodynamically significant plaque noted in the CCA. The LICA       velocities remain within normal range and have decreased compared       to the prior exam. The proximal LICA is prominent, measuring 1.1       cm AP x 1.1 cm TRV.  Vertebrals: Bilateral vertebral arteries demonstrate antegrade flow. Subclavians: Normal flow hemodynamics were seen in bilateral subclavian       arteries.    CTA AO BIFEM (01-12-17) requested by Dr. Stanford Breed for right calf pain when walking: 1. 3.3 cm fusiform infrarenal abdominal aortic aneurysm. Recommend followup by ultrasound in 3 years. This recommendation follows ACR consensus guidelines: White Paper of the ACR Incidental Findings Committee  II on Vascular Findings. J Am Coll Radiol 2013; 10:789-794. 2. Tortuous bilateral iliac arterial systems without stenosis or aneurysm. 3. Calcified plaque across the RIGHT common femoral artery bifurcation into the proximal SFA resulting in stenosis of probable hemodynamic significance. Correlate with segmental pressures. 4. Mild eccentric calcified plaque in the LEFT common femoral artery across its bifurcation, without definite significant stenosis. 5. Contiguous three-vessel tibial runoff bilaterally.  NON-VASCULAR  1. Colonic diverticulosis. 2. Prostatic enlargement. 3. Multilevel lumbar spondylitic change.     PLAN:  Based on the patient's vascular studies and examination, pt will return to clinic in 6 months with ABI;'s. AAA duplex in 2021.  Walk at least 30 minutes total daily in a safe environment.   Follow up with his PCP or Dr. Ellene Route re right leg mild soreness.   I discussed in depth with the patient  the nature of atherosclerosis, and emphasized the importance of maximal medical management including strict control of blood pressure, blood glucose, and lipid levels, obtaining regular exercise, and continued cessation of smoking.  The patient is aware that without maximal medical management the underlying atherosclerotic disease process will progress, limiting the benefit of any interventions.  The patient was given information about PAD including signs, symptoms, treatment, what symptoms should prompt the patient to seek immediate medical care, and risk reduction measures to take.  Clemon Chambers, RN, MSN, FNP-C [facility] Phone: [phone]  Clinic MD: Donzetta Matters  02/23/18 2:36 PM

## 2018-02-23 NOTE — Patient Instructions (Signed)
Peripheral Vascular Disease Peripheral vascular disease (PVD) is a disease of the blood vessels that are not part of your heart and brain. A simple term for PVD is poor circulation. In most cases, PVD narrows the blood vessels that carry blood from your heart to the rest of your body. This can result in a decreased supply of blood to your arms, legs, and internal organs, like your stomach or kidneys. However, it most often affects a person's lower legs and feet. There are two types of PVD.  Organic PVD. This is the more common type. It is caused by damage to the structure of blood vessels.  Functional PVD. This is caused by conditions that make blood vessels contract and tighten (spasm).  Without treatment, PVD tends to get worse over time. PVD can also lead to acute ischemic limb. This is when an arm or limb suddenly has trouble getting enough blood. This is a medical emergency. Follow these instructions at home:  Take medicines only as told by your doctor.  Do not use any tobacco products, including cigarettes, chewing tobacco, or electronic cigarettes. If you need help quitting, ask your doctor.  Lose weight if you are overweight, and maintain a healthy weight as told by your doctor.  Eat a diet that is low in fat and cholesterol. If you need help, ask your doctor.  Exercise regularly. Ask your doctor for some good activities for you.  Take good care of your feet. ? Wear comfortable shoes that fit well. ? Check your feet often for any cuts or sores. Contact a doctor if:  You have cramps in your legs while walking.  You have leg pain when you are at rest.  You have coldness in a leg or foot.  Your skin changes.  You are unable to get or have an erection (erectile dysfunction).  You have cuts or sores on your feet that are not healing. Get help right away if:  Your arm or leg turns cold and blue.  Your arms or legs become red, warm, swollen, painful, or numb.  You have  chest pain or trouble breathing.  You suddenly have weakness in your face, arm, or leg.  You become very confused or you cannot speak.  You suddenly have a very bad headache.  You suddenly cannot see. This information is not intended to replace advice given to you by your health care provider. Make sure you discuss any questions you have with your health care provider. Document Released: 06/01/2009 Document Revised: 08/13/2015 Document Reviewed: 08/15/2013 Elsevier Interactive Patient Education  2017 [employer].     Abdominal Aortic Aneurysm Blood pumps away from the heart through tubes (blood vessels) called arteries. Aneurysms are weak or damaged places in the wall of an artery. It bulges out like a balloon. An abdominal aortic aneurysm happens in the main artery of the body (aorta). It can burst or tear, causing bleeding inside the body. This is an emergency. It needs treatment right away. What are the causes? The exact cause is unknown. Things that could cause this problem include:  Fat and other substances building up in the lining of a tube.  Swelling of the walls of a blood vessel.  Certain tissue diseases.  Belly (abdominal) trauma.  An infection in the main artery of the body.  What increases the risk? There are things that make it more likely for you to have an aneurysm. These include:  Being over the age of 79 years old.  Having  high blood pressure (hypertension).  Being a male.  Being white.  Being very overweight (obese).  Having a family history of aneurysm.  Using tobacco products.  What are the signs or symptoms? Symptoms depend on the size of the aneurysm and how fast it grows. There may not be symptoms. If symptoms occur, they can include:  Pain (belly, side, lower back, or groin).  Feeling full after eating a small amount of food.  Feeling sick to your stomach (nauseous), throwing up (vomiting), or both.  Feeling a lump in your belly that  feels like it is beating (pulsating).  Feeling like you will pass out (faint).  How is this treated?  Medicine to control blood pressure and pain.  Imaging tests to see if the aneurysm gets bigger.  Surgery. How is this prevented? To lessen your chance of getting this condition:  Stop smoking. Stop chewing tobacco.  Limit or avoid alcohol.  Keep your blood pressure, blood sugar, and cholesterol within normal limits.  Eat less salt.  Eat foods low in saturated fats and cholesterol. These are found in animal and whole dairy products.  Eat more fiber. Fiber is found in whole grains, vegetables, and fruits.  Keep a healthy weight.  Stay active and exercise often.  This information is not intended to replace advice given to you by your health care provider. Make sure you discuss any questions you have with your health care provider. Document Released: 07/02/2012 Document Revised: 08/13/2015 Document Reviewed: 04/06/2012 Elsevier Interactive Patient Education  2017 [employer].

## 2018-02-26 ENCOUNTER — Other Ambulatory Visit: Payer: Self-pay | Admitting: *Deleted

## 2018-02-26 DIAGNOSIS — I739 Peripheral vascular disease, unspecified: Secondary | ICD-10-CM

## 2018-03-20 ENCOUNTER — Inpatient Hospital Stay (HOSPITAL_COMMUNITY): Admission: RE | Admit: 2018-03-20 | Payer: Medicare HMO | Source: Ambulatory Visit

## 2018-04-11 DIAGNOSIS — Z961 Presence of intraocular lens: Secondary | ICD-10-CM | POA: Diagnosis not present

## 2018-04-11 DIAGNOSIS — D3131 Benign neoplasm of right choroid: Secondary | ICD-10-CM | POA: Diagnosis not present

## 2018-04-11 DIAGNOSIS — H43813 Vitreous degeneration, bilateral: Secondary | ICD-10-CM | POA: Diagnosis not present

## 2018-04-11 DIAGNOSIS — H5213 Myopia, bilateral: Secondary | ICD-10-CM | POA: Diagnosis not present

## 2018-04-25 DIAGNOSIS — L821 Other seborrheic keratosis: Secondary | ICD-10-CM | POA: Diagnosis not present

## 2018-04-25 DIAGNOSIS — L570 Actinic keratosis: Secondary | ICD-10-CM | POA: Diagnosis not present

## 2018-04-25 DIAGNOSIS — D485 Neoplasm of uncertain behavior of skin: Secondary | ICD-10-CM | POA: Diagnosis not present

## 2018-04-25 DIAGNOSIS — Z85828 Personal history of other malignant neoplasm of skin: Secondary | ICD-10-CM | POA: Diagnosis not present

## 2018-05-21 ENCOUNTER — Ambulatory Visit (INDEPENDENT_AMBULATORY_CARE_PROVIDER_SITE_OTHER): Payer: Medicare HMO | Admitting: Orthopaedic Surgery

## 2018-05-21 ENCOUNTER — Ambulatory Visit (INDEPENDENT_AMBULATORY_CARE_PROVIDER_SITE_OTHER): Payer: Medicare HMO

## 2018-05-21 ENCOUNTER — Encounter (INDEPENDENT_AMBULATORY_CARE_PROVIDER_SITE_OTHER): Payer: Self-pay | Admitting: Orthopaedic Surgery

## 2018-05-21 DIAGNOSIS — M25561 Pain in right knee: Secondary | ICD-10-CM

## 2018-05-21 MED ORDER — LIDOCAINE HCL 1 % IJ SOLN
3.0000 mL | INTRAMUSCULAR | Status: AC | PRN
  Administered 2018-05-21: 3 mL

## 2018-05-21 MED ORDER — METHYLPREDNISOLONE ACETATE 40 MG/ML IJ SUSP
40.0000 mg | INTRAMUSCULAR | Status: AC | PRN
  Administered 2018-05-21: 40 mg via INTRA_ARTICULAR

## 2018-05-21 NOTE — Progress Notes (Signed)
Office Visit Note   Patient: Manuel Friedman           Date of Birth: 1941-03-17           MRN: [id number] Visit Date: 05/21/2018              Requested by: [name], MD [address] [place], [place] [address] PCP: [name], MD   Assessment & Plan: Visit Diagnoses:  1. Acute pain of right knee     Plan: I do feel that he would benefit from a steroid injection in that knee.  He understands this fully and does wish to have something like that.  He understands the risk and benefits of injections and tolerated it well.  All question concerns were answered and addressed.  Follow-up can be as needed.  Follow-Up Instructions: Return if symptoms worsen or fail to improve.   Orders:  Orders Placed This Encounter  Procedures  . Large Joint Inj  . XR Knee 1-2 Views Right   No orders of the defined types were placed in this encounter.     Procedures: Large Joint Inj: R knee on 05/21/2018 3:26 PM Indications: diagnostic evaluation and pain Details: 22 G 1.5 in needle, superolateral approach  Arthrogram: No  Medications: 3 mL lidocaine 1 %; 40 mg methylPREDNISolone acetate 40 MG/ML Outcome: tolerated well, no immediate complications Procedure, treatment alternatives, risks and benefits explained, specific risks discussed. Consent was given by the patient. Immediately prior to procedure a time out was called to verify the correct patient, procedure, equipment, support staff and site/side marked as required. Patient was prepped and draped in the usual sterile fashion.       Clinical Data: No additional findings.   Subjective: Chief Complaint  Patient presents with  . Right Knee - Pain  Patient is a very pleasant and active 78 year old gentleman that I saw a decade ago for shoulder issue.  He comes in with acute right knee pain is been bothering him more as an irritation if he is been sitting for a while or if there is been driving for a while.  He points to mainly the  patella tendon but some of the knee joint space is source of his pain.  He denies any locking catching.  Does not wake him up at night.  He is never had a injection in this area but would like to consider that today.  He says he has been healthy otherwise with no significant issues.  He said he had that knee drained in a steroid injection in the knee back in either high school or college.  HPI  Review of Systems He currently denies any headache, chest pain, shortness of breath, fever, chills, nausea, vomiting  Objective: Vital Signs: There were no vitals taken for this visit.  Physical Exam he is alert and oriented x3 and in no acute distress  Ortho Exam Examination of his right knee shows no effusion.  He has slight tenderness underneath the patella tendon and the patellofemoral joint itself as well as medial joint line but the knee is ligamentously stable with full range of motion. Specialty Comments:  No specialty comments available.  Imaging: Xr Knee 1-2 Views Right  Result Date: 05/21/2018 An AP and lateral of the right knee shows well-maintained joint space with no acute findings.  The alignment is overall neutral.    PMFS History: Patient Active Problem List   Diagnosis Date Noted  . PAD (peripheral artery disease) ([place]) 04/06/2017  .  Rosacea   . Internal and external hemorrhoids without complication   . Hyperlipemia   . HTN (hypertension)   . Hiatal hernia   . Esophageal stricture   . Diverticulosis   . Depression   . Decreased hearing   . COPD (chronic obstructive pulmonary disease) ([place])   . Arthritis   . Adenomatous colon polyp   . Peripheral arterial disease ([place] [facility]) 02/03/2017  . Abdominal aortic aneurysm ([place]) 07/04/2014  . Personal history of colonic polyps 03/27/2012  . Cerebrovascular disease 09/26/2011  . CAD (coronary artery disease) 04/18/2011  . Lung nodule 04/18/2011  . Chest pain 03/07/2011  . Hypertension 03/07/2011  . Hyperlipidemia 03/07/2011    Past Medical History:  Diagnosis Date  . Adenomatous colon polyp   . Arthritis   . CAD (coronary artery disease)    s/p cath 03/2011 with total RCA, left to right collaterals and mild nonobstructive disease in the LAD and LCX. Trying medical management but could attempt PCI of the RCA  . Cancer ([place])    skin cancer  . Cataract    Bil/had surgery  . COPD (chronic obstructive pulmonary disease) ([facility])   . Decreased hearing    wears hearing aid/Bil  . Depression   . Diverticulosis   . Esophageal stricture   . Hiatal hernia   . HTN (hypertension)   . Hyperlipemia   . Internal and external hemorrhoids without complication   . Lung nodule    Noted on CTA Jan 2013. Needs follow upstudy  . Peripheral vascular disease ([place])   . Rosacea   . Swelling of ankle, right   . Umbilical hernia    hx    Family History  Problem Relation Age of Onset  . Pancreatic cancer Mother   . Multiple myeloma Father   . Colon cancer Neg Hx   . Stomach cancer Neg Hx     Past Surgical History:  Procedure Laterality Date  . cataract surgery     Bil  . COLONOSCOPY W/ BIOPSIES AND POLYPECTOMY  01/30/2007   diverticulosis, internal and external hemorrhoids  . ENDARTERECTOMY FEMORAL Right 04/06/2017   Procedure: ENDARTERECTOMY RIGHT COMMON FEMORAL ARTERY;  Surgeon: Angelia Mould, MD;  Location: [facility];  Service: Vascular;  Laterality: Right;  . LOWER EXTREMITY ANGIOGRAPHY N/A 03/27/2017   Procedure: LOWER EXTREMITY ANGIOGRAPHY;  Surgeon: Lorretta Harp, MD;  Location: [facility];  Service: Cardiovascular;  Laterality: N/A;  . PATCH ANGIOPLASTY Right 04/06/2017   Procedure: PATCH ANGIOPLASTY USING RIGHT GREATER SAPHENOUS VEIN ON RIGHT COMMON FEMORAL ARTERY;  Surgeon: Angelia Mould, MD;  Location: [facility];  Service: Vascular;  Laterality: Right;  . TONSILLECTOMY    . UPPER GASTROINTESTINAL ENDOSCOPY  02/19/2009   stricture, GERD, Hiatal hernia   Social History   Occupational  History  . Not on file  Tobacco Use  . Smoking status: Former Smoker    Last attempt to quit: 02/29/1992    Years since quitting: 26.2  . Smokeless tobacco: Never Used  Substance and Sexual Activity  . Alcohol use: Yes    Alcohol/week: 7.0 standard drinks    Types: 7 drink(s) per week  . Drug use: No  . Sexual activity: Not on file

## 2018-06-15 ENCOUNTER — Other Ambulatory Visit: Payer: Self-pay | Admitting: Cardiology

## 2018-06-18 NOTE — Telephone Encounter (Signed)
RX Refill 

## 2018-08-14 ENCOUNTER — Telehealth: Payer: Self-pay | Admitting: Cardiology

## 2018-08-14 DIAGNOSIS — L02811 Cutaneous abscess of head [any part, except face]: Secondary | ICD-10-CM | POA: Diagnosis not present

## 2018-08-14 DIAGNOSIS — Z85828 Personal history of other malignant neoplasm of skin: Secondary | ICD-10-CM | POA: Diagnosis not present

## 2018-08-14 DIAGNOSIS — L720 Epidermal cyst: Secondary | ICD-10-CM | POA: Diagnosis not present

## 2018-08-14 NOTE — Telephone Encounter (Signed)
Left message to call back  

## 2018-08-14 NOTE — Telephone Encounter (Signed)
F/U Message             Patient is calling about his appointment on 06/17 he is asking if he really needs the appointment, patient would like a call back on his cell [phone]

## 2018-08-14 NOTE — Telephone Encounter (Signed)
Left message for pt to call.

## 2018-08-14 NOTE — Telephone Encounter (Signed)
New message:    Patient has a up coming test and patient would like for some one to call him back concering this appt.

## 2018-08-15 NOTE — Telephone Encounter (Signed)
Left message for pt to call, he is scheduled for an abdominal US to follow up on AAA.

## 2018-08-15 NOTE — Telephone Encounter (Signed)
Spoke with pt, aware of appointment reasons.

## 2018-08-15 NOTE — Telephone Encounter (Signed)
F/U Message            Patient is returning Manuel Friedman's call, would like a call back.

## 2018-08-20 ENCOUNTER — Other Ambulatory Visit: Payer: Self-pay

## 2018-08-20 DIAGNOSIS — I779 Disorder of arteries and arterioles, unspecified: Secondary | ICD-10-CM

## 2018-08-27 ENCOUNTER — Ambulatory Visit: Payer: Medicare HMO | Admitting: Family

## 2018-08-27 ENCOUNTER — Encounter (HOSPITAL_COMMUNITY): Payer: Medicare HMO

## 2018-08-27 ENCOUNTER — Ambulatory Visit (HOSPITAL_COMMUNITY): Payer: Medicare HMO

## 2018-09-05 ENCOUNTER — Ambulatory Visit (HOSPITAL_COMMUNITY)
Admission: RE | Admit: 2018-09-05 | Discharge: 2018-09-05 | Disposition: A | Discharge status: Home / Self Care | Payer: Medicare HMO | Source: Ambulatory Visit | Attending: Cardiovascular Disease | Admitting: Cardiovascular Disease

## 2018-09-05 ENCOUNTER — Other Ambulatory Visit: Payer: Self-pay

## 2018-09-05 ENCOUNTER — Other Ambulatory Visit (HOSPITAL_COMMUNITY): Payer: Self-pay | Admitting: Cardiovascular Disease

## 2018-09-05 DIAGNOSIS — I714 Abdominal aortic aneurysm, without rupture, unspecified: Secondary | ICD-10-CM

## 2018-09-06 ENCOUNTER — Other Ambulatory Visit: Payer: Self-pay

## 2018-09-06 DIAGNOSIS — I714 Abdominal aortic aneurysm, without rupture, unspecified: Secondary | ICD-10-CM

## 2018-09-06 NOTE — Progress Notes (Signed)
Abdominal aortic aneurysm.  Notes recorded by Lorretta Harp, MD on 09/05/2018 at 10:03 AM EDT  No change from prior study. Repeat in 12 months.

## 2018-10-01 DIAGNOSIS — Z85828 Personal history of other malignant neoplasm of skin: Secondary | ICD-10-CM | POA: Diagnosis not present

## 2018-10-01 DIAGNOSIS — L0889 Other specified local infections of the skin and subcutaneous tissue: Secondary | ICD-10-CM | POA: Diagnosis not present

## 2018-10-01 DIAGNOSIS — R21 Rash and other nonspecific skin eruption: Secondary | ICD-10-CM | POA: Diagnosis not present

## 2018-10-01 DIAGNOSIS — L0100 Impetigo, unspecified: Secondary | ICD-10-CM | POA: Diagnosis not present

## 2018-10-29 ENCOUNTER — Other Ambulatory Visit: Payer: Self-pay

## 2018-10-29 ENCOUNTER — Encounter (HOSPITAL_COMMUNITY): Payer: Self-pay

## 2018-10-29 ENCOUNTER — Ambulatory Visit (HOSPITAL_COMMUNITY)
Admission: EM | Admit: 2018-10-29 | Discharge: 2018-10-29 | Disposition: A | Discharge status: Home / Self Care | Payer: Medicare HMO | Attending: Family Medicine | Admitting: Family Medicine

## 2018-10-29 DIAGNOSIS — Z20828 Contact with and (suspected) exposure to other viral communicable diseases: Secondary | ICD-10-CM

## 2018-10-29 DIAGNOSIS — Z20822 Contact with and (suspected) exposure to covid-19: Secondary | ICD-10-CM

## 2018-10-29 DIAGNOSIS — U071 COVID-19: Secondary | ICD-10-CM | POA: Insufficient documentation

## 2018-10-29 NOTE — ED Triage Notes (Signed)
Pt presents for covid testing after an exposure.

## 2018-10-29 NOTE — Discharge Instructions (Addendum)
Person Under Monitoring Name: Manuel Friedman  Location: [place] [address]   Infection Prevention Recommendations for Individuals Confirmed to have, or Being Evaluated for, 2019 Novel Coronavirus (COVID-19) Infection Who Receive Care at Home  Individuals who are confirmed to have, or are being evaluated for, COVID-19 should follow the prevention steps below until a healthcare provider or local or state health department says they can return to normal activities.  Stay home except to get medical care You should restrict activities outside your home, except for getting medical care. Do not go to work, school, or public areas, and do not use public transportation or taxis.  Call ahead before visiting your doctor Before your medical appointment, call the healthcare provider and tell them that you have, or are being evaluated for, COVID-19 infection. This will help the healthcare providers office take steps to keep other people from getting infected. Ask your healthcare provider to call the local or state health department.  Monitor your symptoms Seek prompt medical attention if your illness is worsening (e.g., difficulty breathing). Before going to your medical appointment, call the healthcare provider and tell them that you have, or are being evaluated for, COVID-19 infection. Ask your healthcare provider to call the local or state health department.  Wear a facemask You should wear a facemask that covers your nose and mouth when you are in the same room with other people and when you visit a healthcare provider. People who live with or visit you should also wear a facemask while they are in the same room with you.  Separate yourself from other people in your home As much as possible, you should stay in a different room from other people in your home. Also, you should use a separate bathroom, if available.  Avoid sharing household items You should not share  dishes, drinking glasses, cups, eating utensils, towels, bedding, or other items with other people in your home. After using these items, you should wash them thoroughly with soap and water.  Cover your coughs and sneezes Cover your mouth and nose with a tissue when you cough or sneeze, or you can cough or sneeze into your sleeve. Throw used tissues in a lined trash can, and immediately wash your hands with soap and water for at least 20 seconds or use an alcohol-based hand rub.  Wash your [facility] your hands often and thoroughly with soap and water for at least 20 seconds. You can use an alcohol-based hand sanitizer if soap and water are not available and if your hands are not visibly dirty. Avoid touching your eyes, nose, and mouth with unwashed hands.   Prevention Steps for Caregivers and Household Members of Individuals Confirmed to have, or Being Evaluated for, COVID-19 Infection Being Cared for in the Home  If you live with, or provide care at home for, a person confirmed to have, or being evaluated for, COVID-19 infection please follow these guidelines to prevent infection:  Follow healthcare providers instructions Make sure that you understand and can help the patient follow any healthcare provider instructions for all care.  Provide for the patients basic needs You should help the patient with basic needs in the home and provide support for getting groceries, prescriptions, and other personal needs.  Monitor the patients symptoms If they are getting sicker, call his or her medical provider and tell them that the patient has, or is being evaluated for, COVID-19 infection. This will help the healthcare providers office  take steps to keep other people from getting infected. °Ask the healthcare provider to call the local or state health department. ° °Limit the number of people who have contact with the patient °If possible, have only one caregiver for the patient. °Other  household members should stay in another home or place of residence. If this is not possible, they should stay °in another room, or be separated from the patient as much as possible. Use a separate bathroom, if available. °Restrict visitors who do not have an essential need to be in the home. ° °Keep older adults, very young children, and other sick people away from the patient °Keep older adults, very young children, and those who have compromised immune systems or chronic health conditions away from the patient. This includes people with chronic heart, lung, or kidney conditions, diabetes, and cancer. ° °Ensure good ventilation °Make sure that shared spaces in the home have good air flow, such as from an air conditioner or an opened window, °weather permitting. ° °Wash your hands often °Wash your hands often and thoroughly with soap and water for at least 20 seconds. You can use an alcohol based hand sanitizer if soap and water are not available and if your hands are not visibly dirty. °Avoid touching your eyes, nose, and mouth with unwashed hands. °Use disposable paper towels to dry your hands. If not available, use dedicated cloth towels and replace them when they become wet. ° °Wear a facemask and gloves °Wear a disposable facemask at all times in the room and gloves when you touch or have contact with the patient’s blood, body fluids, and/or secretions or excretions, such as sweat, saliva, sputum, nasal mucus, vomit, urine, or feces.  Ensure the mask fits over your nose and mouth tightly, and do not touch it during use. °Throw out disposable facemasks and gloves after using them. Do not reuse. °Wash your hands immediately after removing your facemask and gloves. °If your personal clothing becomes contaminated, carefully remove clothing and launder. Wash your hands after handling contaminated clothing. °Place all used disposable facemasks, gloves, and other waste in a lined container before disposing them with  other household waste. °Remove gloves and wash your hands immediately after handling these items. ° °Do not share dishes, glasses, or other household items with the patient °Avoid sharing household items. You should not share dishes, drinking glasses, cups, eating utensils, towels, bedding, or other items with a patient who is confirmed to have, or being evaluated for, COVID-19 infection. °After the person uses these items, you should wash them thoroughly with soap and water. ° °Wash laundry thoroughly °Immediately remove and wash clothes or bedding that have blood, body fluids, and/or secretions or excretions, such as sweat, saliva, sputum, nasal mucus, vomit, urine, or feces, on them. °Wear gloves when handling laundry from the patient. °Read and follow directions on labels of laundry or clothing items and detergent. In general, wash and dry with the warmest temperatures recommended on the label. ° °Clean all areas the individual has used often °Clean all touchable surfaces, such as counters, tabletops, doorknobs, bathroom fixtures, toilets, phones, keyboards, tablets, and bedside tables, every day. Also, clean any surfaces that may have blood, body fluids, and/or secretions or excretions on them. °Wear gloves when cleaning surfaces the patient has come in contact with. °Use a diluted bleach solution (e.g., dilute bleach with 1 part bleach and 10 parts water) or a household disinfectant with a label that says EPA-registered for coronaviruses. To make a bleach   solution at home, add 1 tablespoon of bleach to 1 quart (4 cups) of water. For a larger supply, add  cup of bleach to 1 gallon (16 cups) of water. Read labels of cleaning products and follow recommendations provided on product labels. Labels contain instructions for safe and effective use of the cleaning product including precautions you should take when applying the product, such as wearing gloves or eye protection and making sure you have good ventilation  during use of the product. Remove gloves and wash hands immediately after cleaning.  Monitor yourself for signs and symptoms of illness Caregivers and household members are considered close contacts, should monitor their health, and will be asked to limit movement outside of the home to the extent possible. Follow the monitoring steps for close contacts listed on the symptom monitoring form.   ? If you have additional questions, contact your local health department or call the epidemiologist on call at [phone] (available 24/7). ? This guidance is subject to change. For the most up-to-date guidance from [facility], please refer to their website: [email or web address]

## 2018-10-29 NOTE — ED Provider Notes (Signed)
[facility]    CSN: [id number] Arrival date & time: 10/29/18  1200      History   Chief Complaint Chief Complaint  Patient presents with  . Covid Exposure    HPI Manuel Friedman is a 78 y.o. male.   HPI  Patient is here for coronavirus testing.  He is here with his wife.  He states that they went out with his brother and sister-in-law a few days ago.  Received a call yesterday that sister-in-law was positive for corona virus.  He states that he feels well today.  Yesterday he had a temperature of 101.  He thinks it is because he spent too much time out in the heat.  At the time he did feel rather fatigued.  No body aches.  No shaking chills.  No fever or chills today.  No respiratory symptoms.  Past Medical History:  Diagnosis Date  . Adenomatous colon polyp   . Arthritis   . CAD (coronary artery disease)    s/p cath 03/2011 with total RCA, left to right collaterals and mild nonobstructive disease in the LAD and LCX. Trying medical management but could attempt PCI of the RCA  . Cancer ([place])    skin cancer  . Cataract    Bil/had surgery  . COPD (chronic obstructive pulmonary disease) ([place])   . Decreased hearing    wears hearing aid/Bil  . Depression   . Diverticulosis   . Esophageal stricture   . Hiatal hernia   . HTN (hypertension)   . Hyperlipemia   . Internal and external hemorrhoids without complication   . Lung nodule    Noted on CTA Jan 2013. Needs follow upstudy  . Peripheral vascular disease ([place])   . Rosacea   . Swelling of ankle, right   . Umbilical hernia    hx    Patient Active Problem List   Diagnosis Date Noted  . PAD (peripheral artery disease) ([place]) 04/06/2017  . Rosacea   . Internal and external hemorrhoids without complication   . Hyperlipemia   . HTN (hypertension)   . Hiatal hernia   . Esophageal stricture   . Diverticulosis   . Depression   . Decreased hearing   . COPD (chronic obstructive pulmonary disease) ([place])   .  Arthritis   . Adenomatous colon polyp   . Peripheral arterial disease ([place]) 02/03/2017  . Abdominal aortic aneurysm ([place]) 07/04/2014  . Personal history of colonic polyps 03/27/2012  . Cerebrovascular disease 09/26/2011  . CAD (coronary artery disease) 04/18/2011  . Lung nodule 04/18/2011  . Chest pain 03/07/2011  . Hypertension 03/07/2011  . Hyperlipidemia 03/07/2011    Past Surgical History:  Procedure Laterality Date  . cataract surgery     Bil  . COLONOSCOPY W/ BIOPSIES AND POLYPECTOMY  01/30/2007   diverticulosis, internal and external hemorrhoids  . ENDARTERECTOMY FEMORAL Right 04/06/2017   Procedure: ENDARTERECTOMY RIGHT COMMON FEMORAL ARTERY;  Surgeon: Angelia Mould, MD;  Location: [facility];  Service: Vascular;  Laterality: Right;  . LOWER EXTREMITY ANGIOGRAPHY N/A 03/27/2017   Procedure: LOWER EXTREMITY ANGIOGRAPHY;  Surgeon: Lorretta Harp, MD;  Location: [facility];  Service: Cardiovascular;  Laterality: N/A;  . PATCH ANGIOPLASTY Right 04/06/2017   Procedure: PATCH ANGIOPLASTY USING RIGHT GREATER SAPHENOUS VEIN ON RIGHT COMMON FEMORAL ARTERY;  Surgeon: Angelia Mould, MD;  Location: [facility];  Service: Vascular;  Laterality: Right;  . TONSILLECTOMY    . UPPER GASTROINTESTINAL ENDOSCOPY  02/19/2009  stricture, GERD, Hiatal hernia       Home Medications    Prior to Admission medications   Medication Sig Start Date End Date Taking? Authorizing Provider  aspirin EC 81 MG tablet Take 1 tablet (81 mg total) by mouth daily. 03/07/11   Lelon Perla, MD  atorvastatin (LIPITOR) 80 MG tablet Take 1 tablet (80 mg total) by mouth daily. 03/24/17 06/22/17  Lelon Perla, MD  cetirizine (ZYRTEC) 10 MG tablet Take 10 mg by mouth as needed for allergies.     [provider]  Coenzyme Q10 (COQ-10 PO) Take 1 tablet by mouth daily.      [provider]  escitalopram (LEXAPRO) 10 MG tablet Take 10 mg by mouth daily.     [provider]   ibuprofen (ADVIL,MOTRIN) 200 MG tablet Take 400 mg by mouth daily as needed for moderate pain.    [provider]  isosorbide mononitrate (IMDUR) 60 MG 24 hr tablet TAKE 1 TABLET EVERY DAY 06/18/18   Lelon Perla, MD  losartan (COZAAR) 50 MG tablet Take 25 mg by mouth daily.     [provider]  Multiple Vitamin (MULTIVITAMIN WITH MINERALS) TABS tablet Take 1 tablet by mouth daily.    [provider]  naproxen sodium (ALEVE) 220 MG tablet Take 220 mg by mouth daily as needed (pain).    [provider]  zolpidem (AMBIEN) 5 MG tablet Take 5 mg by mouth at bedtime.     [provider]    Family History Family History  Problem Relation Age of Onset  . Pancreatic cancer Mother   . Multiple myeloma Father   . Colon cancer Neg Hx   . Stomach cancer Neg Hx     Social History Social History   Tobacco Use  . Smoking status: Former Smoker    Quit date: 02/29/1992    Years since quitting: 26.6  . Smokeless tobacco: Never Used  Substance Use Topics  . Alcohol use: Yes    Alcohol/week: 7.0 standard drinks    Types: 7 drink(s) per week  . Drug use: No     Allergies   Patient has no known allergies.   Review of Systems Review of Systems  Constitutional: Positive for fever. Negative for chills.  HENT: Negative for ear pain and sore throat.   Eyes: Negative for pain and visual disturbance.  Respiratory: Negative for cough and shortness of breath.   Cardiovascular: Negative for chest pain and palpitations.  Gastrointestinal: Negative for abdominal pain and vomiting.  Genitourinary: Negative for dysuria and hematuria.  Musculoskeletal: Negative for arthralgias and back pain.  Skin: Negative for color change and rash.  Neurological: Negative for seizures and syncope.  All other systems reviewed and are negative.    Physical Exam Triage Vital Signs ED Triage Vitals [10/29/18 1256]  Enc Vitals Group     BP 112/69     Pulse Rate 74      Resp 17     Temp 98.8 F (37.1 C)     Temp Source Oral     SpO2 97 %     Weight      Height      Head Circumference      Peak Flow      Pain Score 0     Pain Loc      Pain Edu?      Excl. in [place]?    No data found.  Updated Vital Signs BP 112/69 (  BP Location: Left Arm)   Pulse 74   Temp 98.8 F (37.1 C) (Oral)   Resp 17   SpO2 97%      Physical Exam Constitutional:      General: He is not in acute distress.    Appearance: He is well-developed and normal weight. He is not ill-appearing.  HENT:     Head: Normocephalic and atraumatic.  Eyes:     Conjunctiva/sclera: Conjunctivae normal.     Pupils: Pupils are equal, round, and reactive to light.  Neck:     Musculoskeletal: Normal range of motion.  Cardiovascular:     Rate and Rhythm: Normal rate.  Pulmonary:     Effort: Pulmonary effort is normal. No respiratory distress.  Abdominal:     General: There is no distension.     Palpations: Abdomen is soft.  Musculoskeletal: Normal range of motion.  Skin:    General: Skin is warm and dry.  Neurological:     Mental Status: He is alert.  Psychiatric:        Mood and Affect: Mood normal.        Behavior: Behavior normal.      UC Treatments / Results  Labs (all labs ordered are listed, but only abnormal results are displayed) Labs Reviewed  NOVEL CORONAVIRUS, NAA (HOSPITAL ORDER, SEND-OUT TO REF LAB)    EKG   Radiology No results found.  Procedures Procedures (including critical care time)  Medications Ordered in UC Medications - No data to display  Initial Impression / Assessment and Plan / UC Course  I have reviewed the triage vital signs and the nursing notes.  Pertinent labs & imaging results that were available during my care of the patient were reviewed by me and considered in my medical decision making (see chart for details).     Patient and wife have many questions about coronavirus.  Quarantine.  Contagion.  Symptoms.  Treatment.  I  answered to the best my ability given the current CDC information available.  Importance of quarantine was emphasized. Final Clinical Impressions(s) / UC Diagnoses   Final diagnoses:  Exposure to Covid-19 Virus     Discharge Instructions        Person Under Monitoring Name: Manuel Friedman  Location: [place] [address]   Infection Prevention Recommendations for Individuals Confirmed to have, or Being Evaluated for, 2019 Novel Coronavirus (COVID-19) Infection Who Receive Care at Home  Individuals who are confirmed to have, or are being evaluated for, COVID-19 should follow the prevention steps below until a healthcare provider or local or state health department says they can return to normal activities.  Stay home except to get medical care You should restrict activities outside your home, except for getting medical care. Do not go to work, school, or public areas, and do not use public transportation or taxis.  Call ahead before visiting your doctor Before your medical appointment, call the healthcare provider and tell them that you have, or are being evaluated for, COVID-19 infection. This will help the healthcare provider's office take steps to keep other people from getting infected. Ask your healthcare provider to call the local or state health department.  Monitor your symptoms Seek prompt medical attention if your illness is worsening (e.g., difficulty breathing). Before going to your medical appointment, call the healthcare provider and tell them that you have, or are being evaluated for, COVID-19 infection. Ask your healthcare provider to call the local or state health department.  Wear a facemask You should wear a facemask that covers your nose and mouth when you are in the same room with other people and when you visit a healthcare provider. People who live with or visit you should also wear a facemask while they are in the same room with you.   Separate yourself from other people in your home As much as possible, you should stay in a different room from other people in your home. Also, you should use a separate bathroom, if available.  Avoid sharing household items You should not share dishes, drinking glasses, cups, eating utensils, towels, bedding, or other items with other people in your home. After using these items, you should wash them thoroughly with soap and water.  Cover your coughs and sneezes Cover your mouth and nose with a tissue when you cough or sneeze, or you can cough or sneeze into your sleeve. Throw used tissues in a lined trash can, and immediately wash your hands with soap and water for at least 20 seconds or use an alcohol-based hand rub.  Wash your [facility] your hands often and thoroughly with soap and water for at least 20 seconds. You can use an alcohol-based hand sanitizer if soap and water are not available and if your hands are not visibly dirty. Avoid touching your eyes, nose, and mouth with unwashed hands.   Prevention Steps for Caregivers and Household Members of Individuals Confirmed to have, or Being Evaluated for, COVID-19 Infection Being Cared for in the Home  If you live with, or provide care at home for, a person confirmed to have, or being evaluated for, COVID-19 infection please follow these guidelines to prevent infection:  Follow healthcare provider's instructions Make sure that you understand and can help the patient follow any healthcare provider instructions for all care.  Provide for the patient's basic needs You should help the patient with basic needs in the home and provide support for getting groceries, prescriptions, and other personal needs.  Monitor the patient's symptoms If they are getting sicker, call his or her medical provider and tell them that the patient has, or is being evaluated for, COVID-19 infection. This will help the healthcare provider's office take  steps to keep other people from getting infected. Ask the healthcare provider to call the local or state health department.  Limit the number of people who have contact with the patient  If possible, have only one caregiver for the patient.  Other household members should stay in another home or place of residence. If this is not possible, they should stay  in another room, or be separated from the patient as much as possible. Use a separate bathroom, if available.  Restrict visitors who do not have an essential need to be in the home.  Keep older adults, very young children, and other sick people away from the patient Keep older adults, very young children, and those who have compromised immune systems or chronic health conditions away from the patient. This includes people with chronic heart, lung, or kidney conditions, diabetes, and cancer.  Ensure good ventilation Make sure that shared spaces in the home have good air flow, such as from an air conditioner or an opened window, weather permitting.  Wash your hands often  Wash your hands often and thoroughly with soap and water for at least 20 seconds. You can use an alcohol based hand sanitizer if soap and water are not available and if your hands are not visibly dirty.  Avoid touching your eyes, nose, and mouth with unwashed hands.  Use disposable paper towels to dry your hands. If not available, use dedicated cloth towels and replace them when they become wet.  Wear a facemask and gloves  Wear a disposable facemask at all times in the room and gloves when you touch or have contact with the patient's blood, body fluids, and/or secretions or excretions, such as sweat, saliva, sputum, nasal mucus, vomit, urine, or feces.  Ensure the mask fits over your nose and mouth tightly, and do not touch it during use.  Throw out disposable facemasks and gloves after using them. Do not reuse.  Wash your hands immediately after removing your  facemask and gloves.  If your personal clothing becomes contaminated, carefully remove clothing and launder. Wash your hands after handling contaminated clothing.  Place all used disposable facemasks, gloves, and other waste in a lined container before disposing them with other household waste.  Remove gloves and wash your hands immediately after handling these items.  Do not share dishes, glasses, or other household items with the patient  Avoid sharing household items. You should not share dishes, drinking glasses, cups, eating utensils, towels, bedding, or other items with a patient who is confirmed to have, or being evaluated for, COVID-19 infection.  After the person uses these items, you should wash them thoroughly with soap and water.  Wash laundry thoroughly  Immediately remove and wash clothes or bedding that have blood, body fluids, and/or secretions or excretions, such as sweat, saliva, sputum, nasal mucus, vomit, urine, or feces, on them.  Wear gloves when handling laundry from the patient.  Read and follow directions on labels of laundry or clothing items and detergent. In general, wash and dry with the warmest temperatures recommended on the label.  Clean all areas the individual has used often  Clean all touchable surfaces, such as counters, tabletops, doorknobs, bathroom fixtures, toilets, phones, keyboards, tablets, and bedside tables, every day. Also, clean any surfaces that may have blood, body fluids, and/or secretions or excretions on them.  Wear gloves when cleaning surfaces the patient has come in contact with.  Use a diluted bleach solution (e.g., dilute bleach with 1 part bleach and 10 parts water) or a household disinfectant with a label that says EPA-registered for coronaviruses. To make a bleach solution at home, add 1 tablespoon of bleach to 1 quart (4 cups) of water. For a larger supply, add  cup of bleach to 1 gallon (16 cups) of water.  Read labels of  cleaning products and follow recommendations provided on product labels. Labels contain instructions for safe and effective use of the cleaning product including precautions you should take when applying the product, such as wearing gloves or eye protection and making sure you have good ventilation during use of the product.  Remove gloves and wash hands immediately after cleaning.  Monitor yourself for signs and symptoms of illness Caregivers and household members are considered close contacts, should monitor their health, and will be asked to limit movement outside of the home to the extent possible. Follow the monitoring steps for close contacts listed on the symptom monitoring form.   ? If you have additional questions, contact your local health department or call the epidemiologist on call at [phone] (available 24/7). ? This guidance is subject to change. For the most up-to-date guidance from [facility], please refer to their website: YouBlogs.pl    ED Prescriptions    None     Controlled Substance  Prescriptions [place] Controlled Substance Registry consulted? Not Applicable   Raylene Everts, MD 10/29/18 1323

## 2018-11-02 ENCOUNTER — Telehealth (HOSPITAL_COMMUNITY): Payer: Self-pay | Admitting: Emergency Medicine

## 2018-11-02 NOTE — Telephone Encounter (Signed)
Your test for COVID-19 was positive, meaning that you were infected with the novel coronavirus and could give the germ to others.  Please continue isolation at home, for at least 10 days since the start of your fever/cough/breathlessness and until you have had 3 consecutive days without fever (without taking a fever reducer) and with cough/breathlessness improving. Please continue good preventive care measures, including:  frequent hand-washing, avoid touching your face, cover coughs/sneezes, stay out of crowds and keep a 6 foot distance from others.  Recheck or go to the nearest hospital ED tent for re-assessment if fever/cough/breathlessness return.  Patient contacted and made aware of    results, all questions answered

## 2018-11-05 LAB — NOVEL CORONAVIRUS, NAA (HOSP ORDER, SEND-OUT TO REF LAB; TAT 18-24 HRS): SARS-CoV-2, NAA: DETECTED — AB

## 2018-11-06 ENCOUNTER — Inpatient Hospital Stay (HOSPITAL_COMMUNITY)
Admission: EM | Admit: 2018-11-06 | Discharge: 2018-11-11 | DRG: 179 | Disposition: A | Discharge status: Home Health | Payer: Medicare HMO | Attending: Internal Medicine | Admitting: Internal Medicine

## 2018-11-06 ENCOUNTER — Other Ambulatory Visit: Payer: Self-pay

## 2018-11-06 ENCOUNTER — Encounter (HOSPITAL_COMMUNITY): Payer: Self-pay | Admitting: Emergency Medicine

## 2018-11-06 ENCOUNTER — Emergency Department (HOSPITAL_COMMUNITY): Payer: Medicare HMO

## 2018-11-06 DIAGNOSIS — K449 Diaphragmatic hernia without obstruction or gangrene: Secondary | ICD-10-CM | POA: Diagnosis present

## 2018-11-06 DIAGNOSIS — Z807 Family history of other malignant neoplasms of lymphoid, hematopoietic and related tissues: Secondary | ICD-10-CM

## 2018-11-06 DIAGNOSIS — E785 Hyperlipidemia, unspecified: Secondary | ICD-10-CM | POA: Diagnosis not present

## 2018-11-06 DIAGNOSIS — Z85828 Personal history of other malignant neoplasm of skin: Secondary | ICD-10-CM

## 2018-11-06 DIAGNOSIS — I10 Essential (primary) hypertension: Secondary | ICD-10-CM | POA: Diagnosis present

## 2018-11-06 DIAGNOSIS — R531 Weakness: Secondary | ICD-10-CM | POA: Diagnosis not present

## 2018-11-06 DIAGNOSIS — K648 Other hemorrhoids: Secondary | ICD-10-CM | POA: Diagnosis present

## 2018-11-06 DIAGNOSIS — Z87891 Personal history of nicotine dependence: Secondary | ICD-10-CM

## 2018-11-06 DIAGNOSIS — I739 Peripheral vascular disease, unspecified: Secondary | ICD-10-CM | POA: Diagnosis present

## 2018-11-06 DIAGNOSIS — M1990 Unspecified osteoarthritis, unspecified site: Secondary | ICD-10-CM | POA: Diagnosis present

## 2018-11-06 DIAGNOSIS — Z7982 Long term (current) use of aspirin: Secondary | ICD-10-CM

## 2018-11-06 DIAGNOSIS — R911 Solitary pulmonary nodule: Secondary | ICD-10-CM | POA: Diagnosis present

## 2018-11-06 DIAGNOSIS — R7989 Other specified abnormal findings of blood chemistry: Secondary | ICD-10-CM | POA: Diagnosis not present

## 2018-11-06 DIAGNOSIS — E7800 Pure hypercholesterolemia, unspecified: Secondary | ICD-10-CM

## 2018-11-06 DIAGNOSIS — R748 Abnormal levels of other serum enzymes: Secondary | ICD-10-CM | POA: Diagnosis not present

## 2018-11-06 DIAGNOSIS — K644 Residual hemorrhoidal skin tags: Secondary | ICD-10-CM | POA: Diagnosis present

## 2018-11-06 DIAGNOSIS — U071 COVID-19: Secondary | ICD-10-CM

## 2018-11-06 DIAGNOSIS — Z86010 Personal history of colonic polyps: Secondary | ICD-10-CM

## 2018-11-06 DIAGNOSIS — I2510 Atherosclerotic heart disease of native coronary artery without angina pectoris: Secondary | ICD-10-CM | POA: Diagnosis present

## 2018-11-06 DIAGNOSIS — Z800 Family history of malignant neoplasm of digestive organs: Secondary | ICD-10-CM | POA: Diagnosis not present

## 2018-11-06 DIAGNOSIS — K222 Esophageal obstruction: Secondary | ICD-10-CM | POA: Diagnosis present

## 2018-11-06 DIAGNOSIS — Z79899 Other long term (current) drug therapy: Secondary | ICD-10-CM

## 2018-11-06 DIAGNOSIS — L719 Rosacea, unspecified: Secondary | ICD-10-CM | POA: Diagnosis present

## 2018-11-06 DIAGNOSIS — R0603 Acute respiratory distress: Secondary | ICD-10-CM | POA: Diagnosis present

## 2018-11-06 DIAGNOSIS — R0689 Other abnormalities of breathing: Secondary | ICD-10-CM | POA: Diagnosis not present

## 2018-11-06 DIAGNOSIS — Z209 Contact with and (suspected) exposure to unspecified communicable disease: Secondary | ICD-10-CM | POA: Diagnosis not present

## 2018-11-06 DIAGNOSIS — R0602 Shortness of breath: Secondary | ICD-10-CM | POA: Diagnosis not present

## 2018-11-06 DIAGNOSIS — F320 Major depressive disorder, single episode, mild: Secondary | ICD-10-CM | POA: Diagnosis not present

## 2018-11-06 DIAGNOSIS — J449 Chronic obstructive pulmonary disease, unspecified: Secondary | ICD-10-CM | POA: Diagnosis present

## 2018-11-06 DIAGNOSIS — F329 Major depressive disorder, single episode, unspecified: Secondary | ICD-10-CM | POA: Diagnosis not present

## 2018-11-06 DIAGNOSIS — F32A Depression, unspecified: Secondary | ICD-10-CM | POA: Diagnosis present

## 2018-11-06 DIAGNOSIS — R0902 Hypoxemia: Secondary | ICD-10-CM | POA: Diagnosis not present

## 2018-11-06 HISTORY — DX: COVID-19: U07.1

## 2018-11-06 LAB — CBC WITH DIFFERENTIAL/PLATELET
Abs Immature Granulocytes: 0.05 10*3/uL (ref 0.00–0.07)
Basophils Absolute: 0 10*3/uL (ref 0.0–0.1)
Basophils Relative: 0 %
Eosinophils Absolute: 0 10*3/uL (ref 0.0–0.5)
Eosinophils Relative: 0 %
HCT: 38.3 % — ABNORMAL LOW (ref 39.0–52.0)
Hemoglobin: 13.3 g/dL (ref 13.0–17.0)
Immature Granulocytes: 1 %
Lymphocytes Relative: 6 %
Lymphs Abs: 0.5 10*3/uL — ABNORMAL LOW (ref 0.7–4.0)
MCH: 31.2 pg (ref 26.0–34.0)
MCHC: 34.7 g/dL (ref 30.0–36.0)
MCV: 89.9 fL (ref 80.0–100.0)
Monocytes Absolute: 0.3 10*3/uL (ref 0.1–1.0)
Monocytes Relative: 4 %
Neutro Abs: 6.6 10*3/uL (ref 1.7–7.7)
Neutrophils Relative %: 89 %
Platelets: 201 10*3/uL (ref 150–400)
RBC: 4.26 MIL/uL (ref 4.22–5.81)
RDW: 12.5 % (ref 11.5–15.5)
WBC: 7.4 10*3/uL (ref 4.0–10.5)
nRBC: 0 % (ref 0.0–0.2)

## 2018-11-06 LAB — COMPREHENSIVE METABOLIC PANEL
ALT: 52 U/L — ABNORMAL HIGH (ref 0–44)
AST: 62 U/L — ABNORMAL HIGH (ref 15–41)
Albumin: 2.9 g/dL — ABNORMAL LOW (ref 3.5–5.0)
Alkaline Phosphatase: 56 U/L (ref 38–126)
Anion gap: 11 (ref 5–15)
BUN: 20 mg/dL (ref 8–23)
CO2: 19 mmol/L — ABNORMAL LOW (ref 22–32)
Calcium: 8.2 mg/dL — ABNORMAL LOW (ref 8.9–10.3)
Chloride: 104 mmol/L (ref 98–111)
Creatinine, Ser: 1.12 mg/dL (ref 0.61–1.24)
GFR calc Af Amer: >60 mL/min (ref >60)
GFR calc non Af Amer: >60 mL/min (ref >60)
Glucose, Bld: 144 mg/dL — ABNORMAL HIGH (ref 70–99)
Potassium: 3.5 mmol/L (ref 3.5–5.1)
Sodium: 134 mmol/L — ABNORMAL LOW (ref 135–145)
Total Bilirubin: 1 mg/dL (ref 0.3–1.2)
Total Protein: 6.5 g/dL (ref 6.5–8.1)

## 2018-11-06 LAB — URINALYSIS, ROUTINE W REFLEX MICROSCOPIC
Bilirubin Urine: NEGATIVE
Glucose, UA: NEGATIVE mg/dL
Hgb urine dipstick: NEGATIVE
Ketones, ur: NEGATIVE mg/dL
Leukocytes,Ua: NEGATIVE
Nitrite: NEGATIVE
Protein, ur: 100 mg/dL — AB
Specific Gravity, Urine: 1.035 — ABNORMAL HIGH (ref 1.005–1.030)
pH: 5 (ref 5.0–8.0)

## 2018-11-06 LAB — TRIGLYCERIDES: Triglycerides: 66 mg/dL (ref <150)

## 2018-11-06 LAB — C-REACTIVE PROTEIN: CRP: 9.6 mg/dL — ABNORMAL HIGH (ref <1.0)

## 2018-11-06 LAB — PROCALCITONIN: Procalcitonin: 0.53 ng/mL

## 2018-11-06 LAB — D-DIMER, QUANTITATIVE: D-Dimer, Quant: 1.41 ug/mL-FEU — ABNORMAL HIGH (ref 0.00–0.50)

## 2018-11-06 LAB — PROTIME-INR
INR: 1 (ref 0.8–1.2)
Prothrombin Time: 13.2 seconds (ref 11.4–15.2)

## 2018-11-06 LAB — LACTIC ACID, PLASMA
Lactic Acid, Venous: 1.3 mmol/L (ref 0.5–1.9)
Lactic Acid, Venous: 1.7 mmol/L (ref 0.5–1.9)

## 2018-11-06 LAB — LACTATE DEHYDROGENASE: LDH: 320 U/L — ABNORMAL HIGH (ref 98–192)

## 2018-11-06 LAB — FIBRINOGEN: Fibrinogen: 755 mg/dL — ABNORMAL HIGH (ref 210–475)

## 2018-11-06 LAB — FERRITIN: Ferritin: 1469 ng/mL — ABNORMAL HIGH (ref 24–336)

## 2018-11-06 LAB — BRAIN NATRIURETIC PEPTIDE: B Natriuretic Peptide: 67.2 pg/mL (ref 0.0–100.0)

## 2018-11-06 MED ORDER — ATORVASTATIN CALCIUM 80 MG PO TABS
80.0000 mg | ORAL_TABLET | Freq: Every day | ORAL | Status: DC
  Administered 2018-11-07 – 2018-11-09 (×3): 80 mg via ORAL
  Filled 2018-11-06: qty 1
  Filled 2018-11-06: qty 2
  Filled 2018-11-06 (×2): qty 1
  Filled 2018-11-06 (×2): qty 2
  Filled 2018-11-06: qty 1

## 2018-11-06 MED ORDER — DEXAMETHASONE SODIUM PHOSPHATE 10 MG/ML IJ SOLN
10.0000 mg | INTRAMUSCULAR | Status: DC
  Administered 2018-11-06 – 2018-11-08 (×3): 10 mg via INTRAVENOUS
  Filled 2018-11-06 (×3): qty 1

## 2018-11-06 MED ORDER — SODIUM CHLORIDE 0.9% FLUSH
3.0000 mL | Freq: Two times a day (BID) | INTRAVENOUS | Status: DC
  Administered 2018-11-06 – 2018-11-10 (×9): 3 mL via INTRAVENOUS

## 2018-11-06 MED ORDER — SODIUM CHLORIDE 0.9 % IV SOLN
Freq: Once | INTRAVENOUS | Status: AC
  Administered 2018-11-06: 12:00:00 via INTRAVENOUS

## 2018-11-06 MED ORDER — GUAIFENESIN-DM 100-10 MG/5ML PO SYRP: 10.0000 mL | ORAL_SOLUTION | ORAL | Status: DC | PRN

## 2018-11-06 MED ORDER — ESCITALOPRAM OXALATE 10 MG PO TABS
10.0000 mg | ORAL_TABLET | Freq: Every day | ORAL | Status: DC
  Administered 2018-11-07 – 2018-11-11 (×5): 10 mg via ORAL
  Filled 2018-11-06 (×5): qty 1

## 2018-11-06 MED ORDER — FAMOTIDINE 20 MG PO TABS
20.0000 mg | ORAL_TABLET | Freq: Two times a day (BID) | ORAL | Status: DC
  Administered 2018-11-06 – 2018-11-11 (×11): 20 mg via ORAL
  Filled 2018-11-06 (×13): qty 1

## 2018-11-06 MED ORDER — LOSARTAN POTASSIUM 25 MG PO TABS
25.0000 mg | ORAL_TABLET | Freq: Every day | ORAL | Status: DC
  Administered 2018-11-07 – 2018-11-11 (×5): 25 mg via ORAL
  Filled 2018-11-06: qty 1
  Filled 2018-11-06: qty 0.5
  Filled 2018-11-06 (×3): qty 1
  Filled 2018-11-06: qty 0.5

## 2018-11-06 MED ORDER — VITAMIN C 500 MG PO TABS
500.0000 mg | ORAL_TABLET | Freq: Every day | ORAL | Status: DC
  Administered 2018-11-06 – 2018-11-11 (×6): 500 mg via ORAL
  Filled 2018-11-06 (×6): qty 1

## 2018-11-06 MED ORDER — ONDANSETRON HCL 4 MG/2ML IJ SOLN: 4.0000 mg | Freq: Four times a day (QID) | INTRAMUSCULAR | Status: DC | PRN

## 2018-11-06 MED ORDER — HYDROCOD POLST-CPM POLST ER 10-8 MG/5ML PO SUER: 5.0000 mL | Freq: Two times a day (BID) | ORAL | Status: DC | PRN

## 2018-11-06 MED ORDER — ENOXAPARIN SODIUM 40 MG/0.4ML ~~LOC~~ SOLN
40.0000 mg | SUBCUTANEOUS | Status: DC
  Administered 2018-11-06 – 2018-11-11 (×6): 40 mg via SUBCUTANEOUS
  Filled 2018-11-06 (×6): qty 0.4

## 2018-11-06 MED ORDER — ZOLPIDEM TARTRATE 5 MG PO TABS
5.0000 mg | ORAL_TABLET | Freq: Every day | ORAL | Status: DC
  Administered 2018-11-06 – 2018-11-10 (×5): 5 mg via ORAL
  Filled 2018-11-06 (×5): qty 1

## 2018-11-06 MED ORDER — SODIUM CHLORIDE 0.9 % IV SOLN
500.0000 mg | INTRAVENOUS | Status: DC
  Administered 2018-11-06 – 2018-11-07 (×2): 500 mg via INTRAVENOUS
  Filled 2018-11-06 (×2): qty 500

## 2018-11-06 MED ORDER — ISOSORBIDE MONONITRATE ER 30 MG PO TB24
60.0000 mg | ORAL_TABLET | Freq: Every day | ORAL | Status: DC
  Filled 2018-11-06 (×2): qty 2

## 2018-11-06 MED ORDER — ACETAMINOPHEN 325 MG PO TABS
650.0000 mg | ORAL_TABLET | Freq: Four times a day (QID) | ORAL | Status: DC | PRN
  Administered 2018-11-06: 650 mg via ORAL
  Filled 2018-11-06: qty 2

## 2018-11-06 MED ORDER — ONDANSETRON HCL 4 MG PO TABS: 4.0000 mg | ORAL_TABLET | Freq: Four times a day (QID) | ORAL | Status: DC | PRN

## 2018-11-06 MED ORDER — ISOSORBIDE MONONITRATE ER 60 MG PO TB24
60.0000 mg | ORAL_TABLET | Freq: Every day | ORAL | Status: DC
  Administered 2018-11-07 – 2018-11-11 (×5): 60 mg via ORAL
  Filled 2018-11-06: qty 1
  Filled 2018-11-06: qty 2
  Filled 2018-11-06 (×2): qty 1
  Filled 2018-11-06: qty 2
  Filled 2018-11-06: qty 1

## 2018-11-06 MED ORDER — ALBUTEROL SULFATE HFA 108 (90 BASE) MCG/ACT IN AERS
2.0000 | INHALATION_SPRAY | Freq: Four times a day (QID) | RESPIRATORY_TRACT | Status: DC
  Administered 2018-11-06 – 2018-11-08 (×8): 2 via RESPIRATORY_TRACT
  Filled 2018-11-06: qty 6.7

## 2018-11-06 MED ORDER — ZINC SULFATE 220 (50 ZN) MG PO CAPS
220.0000 mg | ORAL_CAPSULE | Freq: Every day | ORAL | Status: DC
  Administered 2018-11-06 – 2018-11-11 (×6): 220 mg via ORAL
  Filled 2018-11-06 (×6): qty 1

## 2018-11-06 NOTE — ED Notes (Signed)
Urine culture collected and sent to the main lab. 

## 2018-11-06 NOTE — ED Notes (Signed)
Attempting report to [facility]

## 2018-11-06 NOTE — ED Provider Notes (Signed)
[facility] EMERGENCY DEPARTMENT Provider Note   CSN: [id number] Arrival date & time: 11/06/18  2671     History   Chief Complaint Chief Complaint  Patient presents with  . Fever    HPI Manuel Friedman is a 78 y.o. male.     HPI Patient with a history of COVID diagnosis 1 week ago presents with concern for ongoing fatigue, dyspnea, fever. Patient notes that both he and his wife were diagnosed with positive result about 1 week ago. Since that time he has been at home, trying to manage his symptoms with fluids, Tylenol, rest. However, over the past 2 days in particular patient has had worsening chills, fever, with minimal improvement with Tylenol. There is associated persistent dyspnea. Patient has a home oxygen monitoring device, found his oxygen saturation to be 88% earlier today. Given the multiple concerns he presents for evaluation.  Patient states that he is generally well, was in his usual state of health prior to the onset of this illness.  He is a non-smoker. Past Medical History:  Diagnosis Date  . Adenomatous colon polyp   . Arthritis   . CAD (coronary artery disease)    s/p cath 03/2011 with total RCA, left to right collaterals and mild nonobstructive disease in the LAD and LCX. Trying medical management but could attempt PCI of the RCA  . Cancer ([place])    skin cancer  . Cataract    Bil/had surgery  . COPD (chronic obstructive pulmonary disease) ([place])   . Decreased hearing    wears hearing aid/Bil  . Depression   . Diverticulosis   . Esophageal stricture   . Hiatal hernia   . HTN (hypertension)   . Hyperlipemia   . Internal and external hemorrhoids without complication   . Lung nodule    Noted on CTA Jan 2013. Needs follow upstudy  . Peripheral vascular disease ([place])   . Rosacea   . Swelling of ankle, right   . Umbilical hernia    hx    Patient Active Problem List   Diagnosis Date Noted  . PAD (peripheral artery disease) ([place])  04/06/2017  . Rosacea   . Internal and external hemorrhoids without complication   . Hyperlipemia   . HTN (hypertension)   . Hiatal hernia   . Esophageal stricture   . Diverticulosis   . Depression   . Decreased hearing   . COPD (chronic obstructive pulmonary disease) ([place])   . Arthritis   . Adenomatous colon polyp   . Peripheral arterial disease ([place]) 02/03/2017  . Abdominal aortic aneurysm ([place]) 07/04/2014  . Personal history of colonic polyps 03/27/2012  . Cerebrovascular disease 09/26/2011  . CAD (coronary artery disease) 04/18/2011  . Lung nodule 04/18/2011  . Chest pain 03/07/2011  . Hypertension 03/07/2011  . Hyperlipidemia 03/07/2011    Past Surgical History:  Procedure Laterality Date  . cataract surgery     Bil  . COLONOSCOPY W/ BIOPSIES AND POLYPECTOMY  01/30/2007   diverticulosis, internal and external hemorrhoids  . ENDARTERECTOMY FEMORAL Right 04/06/2017   Procedure: ENDARTERECTOMY RIGHT COMMON FEMORAL ARTERY;  Surgeon: Angelia Mould, MD;  Location: [facility];  Service: Vascular;  Laterality: Right;  . LOWER EXTREMITY ANGIOGRAPHY N/A 03/27/2017   Procedure: LOWER EXTREMITY ANGIOGRAPHY;  Surgeon: Lorretta Harp, MD;  Location: [facility];  Service: Cardiovascular;  Laterality: N/A;  . PATCH ANGIOPLASTY Right 04/06/2017   Procedure: PATCH ANGIOPLASTY USING RIGHT GREATER SAPHENOUS VEIN ON RIGHT COMMON  FEMORAL ARTERY;  Surgeon: Angelia Mould, MD;  Location: [facility];  Service: Vascular;  Laterality: Right;  . TONSILLECTOMY    . UPPER GASTROINTESTINAL ENDOSCOPY  02/19/2009   stricture, GERD, Hiatal hernia        Home Medications    Prior to Admission medications   Medication Sig Start Date End Date Taking? Authorizing Provider  aspirin EC 81 MG tablet Take 1 tablet (81 mg total) by mouth daily. 03/07/11   Lelon Perla, MD  atorvastatin (LIPITOR) 80 MG tablet Take 1 tablet (80 mg total) by mouth daily. 03/24/17 06/22/17  Lelon Perla, MD   cetirizine (ZYRTEC) 10 MG tablet Take 10 mg by mouth as needed for allergies.     [provider]  Coenzyme Q10 (COQ-10 PO) Take 1 tablet by mouth daily.      [provider]  escitalopram (LEXAPRO) 10 MG tablet Take 10 mg by mouth daily.     [provider]  ibuprofen (ADVIL,MOTRIN) 200 MG tablet Take 400 mg by mouth daily as needed for moderate pain.    [provider]  isosorbide mononitrate (IMDUR) 60 MG 24 hr tablet TAKE 1 TABLET EVERY DAY 06/18/18   Lelon Perla, MD  losartan (COZAAR) 50 MG tablet Take 25 mg by mouth daily.     [provider]  Multiple Vitamin (MULTIVITAMIN WITH MINERALS) TABS tablet Take 1 tablet by mouth daily.    [provider]  naproxen sodium (ALEVE) 220 MG tablet Take 220 mg by mouth daily as needed (pain).    [provider]  zolpidem (AMBIEN) 5 MG tablet Take 5 mg by mouth at bedtime.     [provider]    Family History Family History  Problem Relation Age of Onset  . Pancreatic cancer Mother   . Multiple myeloma Father   . Colon cancer Neg Hx   . Stomach cancer Neg Hx     Social History Social History   Tobacco Use  . Smoking status: Former Smoker    Quit date: 02/29/1992    Years since quitting: 26.7  . Smokeless tobacco: Never Used  Substance Use Topics  . Alcohol use: Yes    Alcohol/week: 7.0 standard drinks    Types: 7 drink(s) per week  . Drug use: No     Allergies   Patient has no known allergies.   Review of Systems Review of Systems  Constitutional:       Per HPI, otherwise negative  HENT:       Per HPI, otherwise negative  Respiratory:       Per HPI, otherwise negative  Cardiovascular:       Per HPI, otherwise negative  Gastrointestinal: Negative for vomiting.  Endocrine:       Negative aside from HPI  Genitourinary:       Neg aside from HPI   Musculoskeletal:       Per HPI, otherwise negative  Skin: Negative.   Neurological: Positive for  weakness. Negative for syncope.     Physical Exam Updated Vital Signs BP 107/60   Pulse 91   Temp 99.1 F (37.3 C) (Oral)   Resp (!) 25   Ht 6' (1.829 m)   Wt 93 kg   SpO2 95%   BMI 27.80 kg/m   Physical Exam Vitals signs and nursing note reviewed.  Constitutional:      Appearance: He is well-developed. He is ill-appearing and diaphoretic.  HENT:  Head: Normocephalic and atraumatic.  Eyes:     Conjunctiva/sclera: Conjunctivae normal.  Cardiovascular:     Rate and Rhythm: Regular rhythm. Tachycardia present.  Pulmonary:     Effort: Pulmonary effort is normal. Tachypnea present.     Breath sounds: No stridor.  Abdominal:     General: There is no distension.  Skin:    General: Skin is warm.  Neurological:     Mental Status: He is alert and oriented to person, place, and time.      ED Treatments / Results  Labs (all labs ordered are listed, but only abnormal results are displayed) Labs Reviewed  CBC WITH DIFFERENTIAL/PLATELET - Abnormal; Notable for the following components:      Result Value   HCT 38.3 (*)    All other components within normal limits  CULTURE, BLOOD (ROUTINE X 2)  CULTURE, BLOOD (ROUTINE X 2)  COMPREHENSIVE METABOLIC PANEL  LACTIC ACID, PLASMA  LACTIC ACID, PLASMA  PROTIME-INR  URINALYSIS, ROUTINE W REFLEX MICROSCOPIC  D-DIMER, QUANTITATIVE (NOT AT [facility])  PROCALCITONIN  LACTATE DEHYDROGENASE  FERRITIN  TRIGLYCERIDES  FIBRINOGEN  C-REACTIVE PROTEIN    EKG None  Radiology Dg Chest Portable 1 View  Result Date: 11/06/2018 CLINICAL DATA:  Shortness of breath EXAM: PORTABLE CHEST 1 VIEW COMPARISON:  March 22, 2017 FINDINGS: There is mild cardiomegaly with pulmonary vascularity within normal limits. There is diffuse interstitial prominence, likely edema. No consolidation. No pleural effusion. No adenopathy. No bone lesions. IMPRESSION: Mild cardiomegaly with diffuse interstitial prominence, likely interstitial edema. Suspect a degree  of congestive heart failure. Atypical organism pneumonia throughout the lungs bilaterally could present in this manner and is a differential consideration. No evident adenopathy. Electronically Signed   By: Lowella Grip III M.D.   On: 11/06/2018 09:24    Procedures Procedures (including critical care time)  Medications Ordered in ED Medications - No data to display   Initial Impression / Assessment and Plan / ED Course  I have reviewed the triage vital signs and the nursing notes.  Pertinent labs & imaging results that were available during my care of the patient were reviewed by me and considered in my medical decision making (see chart for details).    After the initial evaluation reviewed the patient's chart including positive COVID test 8 days ago.   Elderly male presents due to worsening symptoms possibly related to COVID diagnosis. Patient is awake alert, mentating appropriately, but is weak, diaphoretic, tachycardic, tachypneic. With concern for new oxygen requirement the patient received supplemental oxygen. Given this concern, as well as his failure to improve with cover diagnoses the patient will require admission for further monitoring, management.  Manuel Friedman was evaluated in Emergency Department on 11/06/2018 for the symptoms described in the history of present illness. He was evaluated in the context of the global COVID-19 pandemic, which necessitated consideration that the patient might be at risk for infection with the SARS-CoV-2 virus that causes COVID-19. Institutional protocols and algorithms that pertain to the evaluation of patients at risk for COVID-19 are in a state of rapid change based on information released by regulatory bodies including the CDC and federal and state organizations. These policies and algorithms were followed during the patient's care in the ED.  Final Clinical Impressions(s) / ED Diagnoses   Final diagnoses:  COVID-19 virus infection      Carmin Muskrat, MD 11/08/18 [id number]

## 2018-11-06 NOTE — ED Triage Notes (Signed)
Pt arrives via EMS from home with known covid and reports of fever. Pt states it was 101 this AM and took tylenol. Pt reports checking O2 sats at home and has been around 92% but dropped to 89% last night. Pt endorses decreased appetite.

## 2018-11-06 NOTE — Progress Notes (Signed)
Pharmacy Medication Storage Note  Storing home medications for Manuel Friedman in pharmacy secured storage.   Medication storage bag number: [id number]  Delivered to pharmacy @ 1830 by Kerrie Pleasure  Medications will be returned to patient/caregiver upon discharge.  [name] 11/06/18 7:50 PM

## 2018-11-06 NOTE — H&P (Signed)
History and Physical    Manuel Friedman YDX:[id number] DOB: [birth year] DOA: 11/06/2018  Referring MD/NP/PA: Carmin Muskrat, MD PCP: [name], MD  Patient coming from: Home via EMS  Chief Complaint: Fever and weakness  I have personally briefly reviewed patient's old medical records in [place]   HPI: Manuel Friedman is a 78 y.o. male with medical history significant of HTN, CAD, skin cancer, arthritis, remote tobacco abuse, and depression; who presents with complaints of fever and weakness.  He had gone out with his brother and sister-in-law a week and a half ago.  A few days later he stated that his sister-in-law had tested positive for corona virus.  At home he had developed fevers up to 101 F and went to his primary care provider on 8/10 requesting to be tested at that time.  The test came back positive.  Since that time he reports that he has been getting progressively weaker and weaker.  He has been still having intermittent fevers utilizing Tylenol at home.  Reports having decreased appetite due to a continued bad taste in his mouth.  Other associated symptoms include fall a few days ago, mildly productive cough, nausea, and vomiting.  Denies having any significant shortness of breath or pain symptoms recent fall.  He reports a 50 smoking pack year history of tobacco use, but reports quitting over 20 years ago.  Baseline patient reports that he does not require oxygen or need inhalers.  ED Course: On admission into the emergency department patient was noted to be afebrile, respiration rate 25, blood pressures 107/60, and O2 saturations reported to be maintained on 2 L nasal cannula oxygen.  O2 saturations reported to be as low as 88-89% on room air.  Labs significant for LDH 320, ferritin 1469, CRP 9.6, lactic acid 1.7, d-dimer 1.41, BNP 67.2, and fibrinogen 755.  Chest x-ray showed cardiomegaly with prominence concerning for edema versus atypical infection.  Review of  Systems  Constitutional: Positive for fever and malaise/fatigue.  HENT: Positive for congestion.   Eyes: Negative for photophobia and pain.  Respiratory: Positive for cough and sputum production. Negative for wheezing.   Cardiovascular: Negative for chest pain and leg swelling.  Gastrointestinal: Negative for diarrhea.  Genitourinary: Negative for dysuria and frequency.  Musculoskeletal: Positive for falls.  Skin: Negative for rash.  Neurological: Positive for weakness. Negative for loss of consciousness.  Psychiatric/Behavioral: Negative for memory loss and substance abuse.  Otherwise a complete 10 point review of systems was completed, and negative except for as noted here above in HPI.  Past Medical History:  Diagnosis Date  . Adenomatous colon polyp   . Arthritis   . CAD (coronary artery disease)    s/p cath 03/2011 with total RCA, left to right collaterals and mild nonobstructive disease in the LAD and LCX. Trying medical management but could attempt PCI of the RCA  . Cancer ([place])    skin cancer  . Cataract    Bil/had surgery  . COPD (chronic obstructive pulmonary disease) ([place])   . Decreased hearing    wears hearing aid/Bil  . Depression   . Diverticulosis   . Esophageal stricture   . Hiatal hernia   . HTN (hypertension)   . Hyperlipemia   . Internal and external hemorrhoids without complication   . Lung nodule    Noted on CTA Jan 2013. Needs follow upstudy  . Peripheral vascular disease ([place])   . Rosacea   . Swelling of ankle, right   .  Umbilical hernia    hx    Past Surgical History:  Procedure Laterality Date  . cataract surgery     Bil  . COLONOSCOPY W/ BIOPSIES AND POLYPECTOMY  01/30/2007   diverticulosis, internal and external hemorrhoids  . ENDARTERECTOMY FEMORAL Right 04/06/2017   Procedure: ENDARTERECTOMY RIGHT COMMON FEMORAL ARTERY;  Surgeon: Angelia Mould, MD;  Location: [facility];  Service: Vascular;  Laterality: Right;  . LOWER EXTREMITY  ANGIOGRAPHY N/A 03/27/2017   Procedure: LOWER EXTREMITY ANGIOGRAPHY;  Surgeon: Lorretta Harp, MD;  Location: [facility];  Service: Cardiovascular;  Laterality: N/A;  . PATCH ANGIOPLASTY Right 04/06/2017   Procedure: PATCH ANGIOPLASTY USING RIGHT GREATER SAPHENOUS VEIN ON RIGHT COMMON FEMORAL ARTERY;  Surgeon: Angelia Mould, MD;  Location: [facility];  Service: Vascular;  Laterality: Right;  . TONSILLECTOMY    . UPPER GASTROINTESTINAL ENDOSCOPY  02/19/2009   stricture, GERD, Hiatal hernia     reports that he quit smoking about 26 years ago. He has never used smokeless tobacco. He reports current alcohol use of about 7.0 standard drinks of alcohol per week. He reports that he does not use drugs.  No Known Allergies  Family History  Problem Relation Age of Onset  . Pancreatic cancer Mother   . Multiple myeloma Father   . Colon cancer Neg Hx   . Stomach cancer Neg Hx     Prior to Admission medications   Medication Sig Start Date End Date Taking? Authorizing Provider  aspirin EC 81 MG tablet Take 1 tablet (81 mg total) by mouth daily. 03/07/11   Lelon Perla, MD  atorvastatin (LIPITOR) 80 MG tablet Take 1 tablet (80 mg total) by mouth daily. 03/24/17 06/22/17  Lelon Perla, MD  cetirizine (ZYRTEC) 10 MG tablet Take 10 mg by mouth as needed for allergies.     [provider]  Coenzyme Q10 (COQ-10 PO) Take 1 tablet by mouth daily.      [provider]  escitalopram (LEXAPRO) 10 MG tablet Take 10 mg by mouth daily.     [provider]  ibuprofen (ADVIL,MOTRIN) 200 MG tablet Take 400 mg by mouth daily as needed for moderate pain.    [provider]  isosorbide mononitrate (IMDUR) 60 MG 24 hr tablet TAKE 1 TABLET EVERY DAY 06/18/18   Lelon Perla, MD  losartan (COZAAR) 50 MG tablet Take 25 mg by mouth daily.     [provider]  Multiple Vitamin (MULTIVITAMIN WITH MINERALS) TABS tablet Take 1 tablet by mouth daily.    [provider]  naproxen sodium (ALEVE) 220 MG tablet Take 220 mg by mouth daily as needed (pain).    [provider]  zolpidem (AMBIEN) 5 MG tablet Take 5 mg by mouth at bedtime.     [provider]    Physical Exam:  Constitutional: Elderly male who appears to be sick, but able to follow commands Vitals:   11/06/18 0856 11/06/18 0857 11/06/18 0902  BP: 107/60    Pulse: 91    Resp: (!) 25    Temp: 99.1 F (37.3 C)    TempSrc: Oral    SpO2: 92%  95%  Weight:  93 kg   Height:  6' (1.829 m)    Eyes: PERRL, lids and conjunctivae normal ENMT: Mucous membranes are dry.  Posterior pharynx clear of any exudate or lesions.   Neck: normal, supple, no masses, no thyromegaly Respiratory: clear to auscultation bilaterally, no wheezing, no  crackles.  Mildly tachypneic.  No accessory muscle use.  Currently on 2 L of nasal cannula oxygen with O2 saturations 95%. Cardiovascular: Regular rate and rhythm, no murmurs / rubs / gallops.  Trace lower extremity edema. 2+ pedal pulses. No carotid bruits.  Abdomen: no tenderness, no masses palpated. No hepatosplenomegaly. Bowel sounds positive.  Musculoskeletal: no clubbing / cyanosis. No joint deformity upper and lower extremities. Good ROM, no contractures. Normal muscle tone.  Skin: no rashes, lesions, ulcers. No induration Neurologic: CN 2-12 grossly intact. Sensation intact, DTR normal. Strength 5/5 in all 4.  Psychiatric: Normal judgment and insight. Alert and oriented x 3. Normal mood.     Labs on Admission: I have personally reviewed following labs and imaging studies  CBC: Recent Labs  Lab 11/06/18 0900  WBC 7.4  NEUTROABS 6.6  HGB 13.3  HCT 38.3*  MCV 89.9  PLT 397   Basic Metabolic Panel: Recent Labs  Lab 11/06/18 0900  NA 134*  K 3.5  CL 104  CO2 19*  GLUCOSE 144*  BUN 20  CREATININE 1.12  CALCIUM 8.2*   GFR: Estimated Creatinine Clearance: 58.7 mL/min (by C-G formula based on SCr of 1.12 mg/dL).  Liver Function Tests: Recent Labs  Lab 11/06/18 0900  AST 62*  ALT 52*  ALKPHOS 56  BILITOT 1.0  PROT 6.5  ALBUMIN 2.9*   No results for input(s): LIPASE, AMYLASE in the last 168 hours. No results for input(s): AMMONIA in the last 168 hours. Coagulation Profile: Recent Labs  Lab 11/06/18 0900  INR 1.0   Cardiac Enzymes: No results for input(s): CKTOTAL, CKMB, CKMBINDEX, TROPONINI in the last 168 hours. BNP (last 3 results) No results for input(s): PROBNP in the last 8760 hours. HbA1C: No results for input(s): HGBA1C in the last 72 hours. CBG: No results for input(s): GLUCAP in the last 168 hours. Lipid Profile: Recent Labs    11/06/18 0934  TRIG 66   Thyroid Function Tests: No results for input(s): TSH, T4TOTAL, FREET4, T3FREE, THYROIDAB in the last 72 hours. Anemia Panel: Recent Labs    11/06/18 0934  FERRITIN 1,469*   Urine analysis:    Component Value Date/Time   COLORURINE YELLOW 04/04/2017 1000   APPEARANCEUR CLEAR 04/04/2017 1000   LABSPEC 1.008 04/04/2017 1000   PHURINE 6.0 04/04/2017 1000   GLUCOSEU NEGATIVE 04/04/2017 1000   HGBUR NEGATIVE 04/04/2017 1000   BILIRUBINUR NEGATIVE 04/04/2017 1000   KETONESUR NEGATIVE 04/04/2017 1000   PROTEINUR NEGATIVE 04/04/2017 1000   NITRITE NEGATIVE 04/04/2017 1000   LEUKOCYTESUR NEGATIVE 04/04/2017 1000   Sepsis Labs: Recent Results (from the past 240 hour(s))  Novel Coronavirus, NAA (hospital order; send-out to ref lab)     Status: Abnormal   Collection Time: 10/29/18  1:01 PM   Specimen: Nasopharyngeal Swab; Respiratory  Result Value Ref Range Status   SARS-CoV-2, NAA DETECTED (A) NOT DETECTED Final    Comment: CRITICAL RESULT CALLED TO, READ BACK BY AND VERIFIED WITH: RN TINA GOSH [phone] FCP (NOTE)                  Client Requested Flag This test was developed and its performance characteristics determined by [employer]. This test has not been FDA cleared or approved. This test has  been authorized by FDA under an Emergency Use Authorization (EUA). This test is only authorized for the duration of time the declaration that circumstances exist justifying the authorization of the emergency use of in vitro diagnostic tests for detection  of SARS-CoV-2 virus and/or diagnosis of COVID-19 infection under section 564(b)(1) of the Act, 21 U.S.C. 680SUP-1(S)(3), unless the authorization is terminated or revoked sooner. When diagnostic testing is negative, the possibility of a false negative result should be considered in the context of a patient's recent exposures and the presence of clinical signs and symptoms consistent with COVID-19. An individual without symptoms of C OVID-19 and who is not shedding SARS-CoV-2 virus would expect to have a negative (not detected) result in this assay. Performed At: [facility] [address] [place], [place] [id number] Rush Farmer MD TW:[id number]    [place]  Final    Comment: Performed at [facility], [place] [address] [place], [place] [address]     Radiological Exams on Admission: Dg Chest Portable 1 View  Result Date: 11/06/2018 CLINICAL DATA:  Shortness of breath EXAM: PORTABLE CHEST 1 VIEW COMPARISON:  March 22, 2017 FINDINGS: There is mild cardiomegaly with pulmonary vascularity within normal limits. There is diffuse interstitial prominence, likely edema. No consolidation. No pleural effusion. No adenopathy. No bone lesions. IMPRESSION: Mild cardiomegaly with diffuse interstitial prominence, likely interstitial edema. Suspect a degree of congestive heart failure. Atypical organism pneumonia throughout the lungs bilaterally could present in this manner and is a differential consideration. No evident adenopathy. Electronically Signed   By: Lowella Grip III M.D.   On: 11/06/2018 09:24    EKG: Independently reviewed.  Sinus rhythm at 94 bpm with RBBB  Assessment/Plan Respiratory distress  secondary to COVID-19 virus infection: Acute. Patient presents with complaints of fever and generalized weakness.  Found to be COVID-19 positive on 8/10.  Chest x-ray concerning for possible edema versus atypical infection.  As BNP within normal limits suspect symptoms secondary to COVID-19.  Inflammatory markers including ferritin ferritin 1169, CRP 9.6, procalcitonin 0.53, d-dimer 0.41, and fibrinogen 755.  O2 saturations reported to be  89% on room air, and patient currently on 2 L nasal cannula oxygen with O2 saturations maintained. -Admit to telemetry bed at [facility] -COVID-19 order set utilized -Continuous pulse oximetry with nasal cannula oxygen to maintain O2 saturation greater than 90% -Avoiding NSAIDs -Azithromycin IV given elevated procalcitonin -Albuterol inhaler every 6 hours -Calcium and zinc -Pepcid for GI prophylaxis  -Tylenol as needed for fever -Daily monitoring of inflammatory markers  Generalized weakness: Patient reports poor appetite, weakness, and bad taste in his mouth. -Up with assistance     Essential hypertension  -Continue imdur and Cozaar  Hyperlipidemia -Continue atorvastatin  Depression  -Continue Lexapro  DVT prophylaxis: lovenox Code Status: Full  Family Communication: discussed plan of care with the patients wife over the phone Disposition Plan: TBD  Consults called:  None  Admission status: inpatient   Norval Morton MD [facility] Pager [phone]   If 7PM-7AM, please contact night-coverage [email or web address] Password [facility]  11/06/2018, 10:44 AM

## 2018-11-06 NOTE — ED Notes (Signed)
Attempting report to Agricultural consultant, primary unavailable for report

## 2018-11-07 ENCOUNTER — Encounter (HOSPITAL_COMMUNITY): Payer: Self-pay

## 2018-11-07 LAB — CBC WITH DIFFERENTIAL/PLATELET
Abs Immature Granulocytes: 0.04 10*3/uL (ref 0.00–0.07)
Basophils Absolute: 0 10*3/uL (ref 0.0–0.1)
Basophils Relative: 0 %
Eosinophils Absolute: 0 10*3/uL (ref 0.0–0.5)
Eosinophils Relative: 0 %
HCT: 39.1 % (ref 39.0–52.0)
Hemoglobin: 13.3 g/dL (ref 13.0–17.0)
Immature Granulocytes: 1 %
Lymphocytes Relative: 10 %
Lymphs Abs: 0.6 10*3/uL — ABNORMAL LOW (ref 0.7–4.0)
MCH: 31.4 pg (ref 26.0–34.0)
MCHC: 34 g/dL (ref 30.0–36.0)
MCV: 92.2 fL (ref 80.0–100.0)
Monocytes Absolute: 0.1 10*3/uL (ref 0.1–1.0)
Monocytes Relative: 2 %
Neutro Abs: 5.5 10*3/uL (ref 1.7–7.7)
Neutrophils Relative %: 87 %
Platelets: 230 10*3/uL (ref 150–400)
RBC: 4.24 MIL/uL (ref 4.22–5.81)
RDW: 13 % (ref 11.5–15.5)
WBC: 6.3 10*3/uL (ref 4.0–10.5)
nRBC: 0 % (ref 0.0–0.2)

## 2018-11-07 LAB — MAGNESIUM: Magnesium: 2.4 mg/dL (ref 1.7–2.4)

## 2018-11-07 LAB — COMPREHENSIVE METABOLIC PANEL
ALT: 65 U/L — ABNORMAL HIGH (ref 0–44)
AST: 78 U/L — ABNORMAL HIGH (ref 15–41)
Albumin: 2.9 g/dL — ABNORMAL LOW (ref 3.5–5.0)
Alkaline Phosphatase: 61 U/L (ref 38–126)
Anion gap: 8 (ref 5–15)
BUN: 20 mg/dL (ref 8–23)
CO2: 24 mmol/L (ref 22–32)
Calcium: 8.2 mg/dL — ABNORMAL LOW (ref 8.9–10.3)
Chloride: 104 mmol/L (ref 98–111)
Creatinine, Ser: 0.85 mg/dL (ref 0.61–1.24)
GFR calc Af Amer: >60 mL/min (ref >60)
GFR calc non Af Amer: >60 mL/min (ref >60)
Glucose, Bld: 169 mg/dL — ABNORMAL HIGH (ref 70–99)
Potassium: 3.8 mmol/L (ref 3.5–5.1)
Sodium: 136 mmol/L (ref 135–145)
Total Bilirubin: 0.6 mg/dL (ref 0.3–1.2)
Total Protein: 6.9 g/dL (ref 6.5–8.1)

## 2018-11-07 LAB — TRIGLYCERIDES: Triglycerides: 77 mg/dL (ref <150)

## 2018-11-07 LAB — CK: Total CK: 103 U/L (ref 49–397)

## 2018-11-07 LAB — C-REACTIVE PROTEIN: CRP: 11.1 mg/dL — ABNORMAL HIGH (ref <1.0)

## 2018-11-07 LAB — PHOSPHORUS: Phosphorus: 2.5 mg/dL (ref 2.5–4.6)

## 2018-11-07 LAB — FERRITIN: Ferritin: 1819 ng/mL — ABNORMAL HIGH (ref 24–336)

## 2018-11-07 LAB — D-DIMER, QUANTITATIVE: D-Dimer, Quant: 1.2 ug/mL-FEU — ABNORMAL HIGH (ref 0.00–0.50)

## 2018-11-07 MED ORDER — SODIUM CHLORIDE 0.9 % IV SOLN
500.0000 mg | INTRAVENOUS | Status: DC
  Administered 2018-11-08: 500 mg via INTRAVENOUS
  Filled 2018-11-07: qty 500

## 2018-11-07 NOTE — Progress Notes (Addendum)
PROGRESS NOTE                                                                                                                                                                                                             Patient Demographics:    Manuel Friedman, is a 78 y.o. male, DOB - 1941/03/03, QQI:[id number]  Admit date - 11/06/2018   Admitting Physician Norval Morton, MD  Outpatient Primary MD for the patient is [name], MD  LOS - 1   Chief Complaint  Patient presents with  . Fever       Brief Narrative    78 y.o. male with history of HTN, CAD, skin cancer, arthritis, remote tobacco abuse, and depression, presents with  fever and weakness.  Patient tested positive for COVID-19 after exposure to family member, patient transferred to [facility] for further management.    Subjective:    [name] today has, No headache, No chest pain, No abdominal pain - No Nausea, No new weakness tingling or numbness, No Cough - SOB.   Assessment  & Plan :    Principal Problem:   COVID-19 virus infection Active Problems:   Hypertension   Hyperlipidemia   Depression   Generalized weakness  COVID-19 virus infection -Patient's chest x-ray significant for multiple opacities, but there is no hypoxia currently.  No indication for Remdesivir. -Continue with IV Decadron. -Inflammatory markers trending up, continue to monitor closely. -Calcitonin is 0.53, continue with IV antibiotics. -Continue with zinc and vitamin C.  Generalized weakness:  - will consult PT  Essential hypertension  -Continue imdur and Cozaar  Hyperlipidemia -Continue atorvastatin  Depression  -Continue Lexapro    COVID-19 Labs  Recent Labs    11/06/18 0934 11/07/18 0230  DDIMER 1.41* 1.20*  FERRITIN 1,469* 1,819*  LDH 320*  --   CRP 9.6* 11.1*    Lab Results  Component Value Date   SARSCOV2NAA DETECTED (A) 10/29/2018     Code Status : Full  Family  Communication  : D/W patient  Disposition Plan  : Home when stable  Consults  :  None  Procedures  : None  DVT Prophylaxis  :  Travis lovenox  Lab Results  Component Value Date   PLT 230 11/07/2018    Antibiotics  :    Anti-infectives (From admission, onward)  Start     Dose/Rate Route Frequency Ordered Stop   11/06/18 1145  azithromycin (ZITHROMAX) 500 mg in sodium chloride 0.9 % 250 mL IVPB     500 mg 250 mL/hr over 60 Minutes Intravenous Every 24 hours 11/06/18 1145          Objective:   Vitals:   11/06/18 1900 11/07/18 0352 11/07/18 0754 11/07/18 0830  BP: 116/64 128/70 131/70   Pulse: 87  86   Resp: (!) 41 (!) 30 (!) 26   Temp: 98.6 F (37 C) 98.3 F (36.8 C) 99.2 F (37.3 C)   TempSrc: Oral Oral Oral   SpO2: (!) 89% 92% 96% (!) 88%  Weight:      Height:        Wt Readings from Last 3 Encounters:  11/06/18 93 kg  02/23/18 94.3 kg  02/22/18 94.3 kg     Intake/Output Summary (Last 24 hours) at 11/07/2018 1530 Last data filed at 11/07/2018 1351 Gross per 24 hour  Intake 1423 ml  Output 900 ml  Net 523 ml     Physical Exam  Awake Alert, pleasent, No new F.N deficits, Normal affect Symmetrical Chest wall movement, Good air movement bilaterally, CTAB RRR,No Gallops,Rubs or new Murmurs, No Parasternal Heave +ve B.Sounds, Abd Soft, No tenderness,  No rebound - guarding or rigidity. No Cyanosis, Clubbing or edema, No new Rash or bruise     Data Review:    CBC Recent Labs  Lab 11/06/18 0900 11/07/18 0230  WBC 7.4 6.3  HGB 13.3 13.3  HCT 38.3* 39.1  PLT 201 230  MCV 89.9 92.2  MCH 31.2 31.4  MCHC 34.7 34.0  RDW 12.5 13.0  LYMPHSABS 0.5* 0.6*  MONOABS 0.3 0.1  EOSABS 0.0 0.0  BASOSABS 0.0 0.0    Chemistries  Recent Labs  Lab 11/06/18 0900 11/07/18 0230  NA 134* 136  K 3.5 3.8  CL 104 104  CO2 19* 24  GLUCOSE 144* 169*  BUN 20 20  CREATININE 1.12 0.85  CALCIUM 8.2* 8.2*  MG  --  2.4  AST 62* 78*  ALT 52* 65*  ALKPHOS 56 61   BILITOT 1.0 0.6   ------------------------------------------------------------------------------------------------------------------ Recent Labs    11/06/18 0934 11/07/18 0230  TRIG 66 77    No results found for: HGBA1C ------------------------------------------------------------------------------------------------------------------ No results for input(s): TSH, T4TOTAL, T3FREE, THYROIDAB in the last 72 hours.  Invalid input(s): FREET3 ------------------------------------------------------------------------------------------------------------------ Recent Labs    11/06/18 0934 11/07/18 0230  FERRITIN 1,469* 1,819*    Coagulation profile Recent Labs  Lab 11/06/18 0900  INR 1.0    Recent Labs    11/06/18 0934 11/07/18 0230  DDIMER 1.41* 1.20*    Cardiac Enzymes No results for input(s): CKMB, TROPONINI, MYOGLOBIN in the last 168 hours.  Invalid input(s): CK ------------------------------------------------------------------------------------------------------------------    Component Value Date/Time   BNP 67.2 11/06/2018 0955    Inpatient Medications  Scheduled Meds: . albuterol  2 puff Inhalation Q6H  . atorvastatin  80 mg Oral Daily  . dexamethasone (DECADRON) injection  10 mg Intravenous Q24H  . enoxaparin (LOVENOX) injection  40 mg Subcutaneous Q24H  . escitalopram  10 mg Oral Daily  . famotidine  20 mg Oral BID  . isosorbide mononitrate  60 mg Oral Daily  . losartan  25 mg Oral Daily  . sodium chloride flush  3 mL Intravenous Q12H  . vitamin C  500 mg Oral Daily  . zinc sulfate  220 mg Oral Daily  .  zolpidem  5 mg Oral QHS   Continuous Infusions: . azithromycin 500 mg (11/07/18 0828)   PRN Meds:.acetaminophen, chlorpheniramine-HYDROcodone, guaiFENesin-dextromethorphan, ondansetron **OR** ondansetron (ZOFRAN) IV  Micro Results Recent Results (from the past 240 hour(s))  Novel Coronavirus, NAA (hospital order; send-out to ref lab)     Status:  Abnormal   Collection Time: 10/29/18  1:01 PM   Specimen: Nasopharyngeal Swab; Respiratory  Result Value Ref Range Status   SARS-CoV-2, NAA DETECTED (A) NOT DETECTED Final    Comment: CRITICAL RESULT CALLED TO, READ BACK BY AND VERIFIED WITH: RN TINA GOSH [phone] FCP (NOTE)                  Client Requested Flag This test was developed and its performance characteristics determined by [employer]. This test has not been FDA cleared or approved. This test has been authorized by FDA under an Emergency Use Authorization (EUA). This test is only authorized for the duration of time the declaration that circumstances exist justifying the authorization of the emergency use of in vitro diagnostic tests for detection of SARS-CoV-2 virus and/or diagnosis of COVID-19 infection under section 564(b)(1) of the Act, 21 U.S.C. 546FKC-1(E)(7), unless the authorization is terminated or revoked sooner. When diagnostic testing is negative, the possibility of a false negative result should be considered in the context of a patient's recent exposures and the presence of clinical signs and symptoms consistent with COVID-19. An individual without symptoms of C OVID-19 and who is not shedding SARS-CoV-2 virus would expect to have a negative (not detected) result in this assay. Performed At: [facility] [address] [place], [place] [id number] Rush Farmer MD SW:[id number]    Jansen  Final    Comment: Performed at [facility], [place] [address], [place], [place] [address]  Culture, blood (Routine x 2)     Status: None (Preliminary result)   Collection Time: 11/06/18  9:36 AM   Specimen: BLOOD  Result Value Ref Range Status   Specimen Description BLOOD SITE NOT SPECIFIED  Final   Special Requests   Final    BOTTLES DRAWN AEROBIC AND ANAEROBIC Blood Culture adequate volume   Culture   Final    NO GROWTH < 24 HOURS Performed at [facility], [place] [address], [place], [place] [address]    Report Status PENDING  Incomplete  Culture, blood (Routine x 2)     Status: None (Preliminary result)   Collection Time: 11/06/18  9:50 AM   Specimen: BLOOD RIGHT ARM  Result Value Ref Range Status   Specimen Description BLOOD RIGHT ARM  Final   Special Requests   Final    BOTTLES DRAWN AEROBIC AND ANAEROBIC Blood Culture adequate volume   Culture   Final    NO GROWTH < 24 HOURS Performed at [facility], [place] [address] [place], [place] [address]    Report Status PENDING  Incomplete    Radiology Reports Dg Chest Portable 1 View  Result Date: 11/06/2018 CLINICAL DATA:  Shortness of breath EXAM: PORTABLE CHEST 1 VIEW COMPARISON:  March 22, 2017 FINDINGS: There is mild cardiomegaly with pulmonary vascularity within normal limits. There is diffuse interstitial prominence, likely edema. No consolidation. No pleural effusion. No adenopathy. No bone lesions. IMPRESSION: Mild cardiomegaly with diffuse interstitial prominence, likely interstitial edema. Suspect a degree of congestive heart failure. Atypical organism pneumonia throughout the lungs bilaterally could present in this manner and is a differential consideration. No evident  adenopathy. Electronically Signed   By: Lowella Grip III M.D.   On: 11/06/2018 09:24     Phillips Climes M.D on 11/07/2018 at 3:30 PM  Between 7am to 7pm - Pager - [phone]  After 7pm go to [email or web address] - password [facility]  [facility] -  Office  [phone]

## 2018-11-08 DIAGNOSIS — F320 Major depressive disorder, single episode, mild: Secondary | ICD-10-CM

## 2018-11-08 LAB — CBC WITH DIFFERENTIAL/PLATELET
Abs Immature Granulocytes: 0.07 10*3/uL (ref 0.00–0.07)
Basophils Absolute: 0 10*3/uL (ref 0.0–0.1)
Basophils Relative: 0 %
Eosinophils Absolute: 0 10*3/uL (ref 0.0–0.5)
Eosinophils Relative: 0 %
HCT: 36 % — ABNORMAL LOW (ref 39.0–52.0)
Hemoglobin: 12.4 g/dL — ABNORMAL LOW (ref 13.0–17.0)
Immature Granulocytes: 1 %
Lymphocytes Relative: 9 %
Lymphs Abs: 0.8 10*3/uL (ref 0.7–4.0)
MCH: 31.5 pg (ref 26.0–34.0)
MCHC: 34.4 g/dL (ref 30.0–36.0)
MCV: 91.4 fL (ref 80.0–100.0)
Monocytes Absolute: 0.3 10*3/uL (ref 0.1–1.0)
Monocytes Relative: 4 %
Neutro Abs: 7.5 10*3/uL (ref 1.7–7.7)
Neutrophils Relative %: 86 %
Platelets: 259 10*3/uL (ref 150–400)
RBC: 3.94 MIL/uL — ABNORMAL LOW (ref 4.22–5.81)
RDW: 13 % (ref 11.5–15.5)
WBC: 8.7 10*3/uL (ref 4.0–10.5)
nRBC: 0 % (ref 0.0–0.2)

## 2018-11-08 LAB — COMPREHENSIVE METABOLIC PANEL
ALT: 310 U/L — ABNORMAL HIGH (ref 0–44)
AST: 324 U/L — ABNORMAL HIGH (ref 15–41)
Albumin: 2.7 g/dL — ABNORMAL LOW (ref 3.5–5.0)
Alkaline Phosphatase: 66 U/L (ref 38–126)
Anion gap: 11 (ref 5–15)
BUN: 26 mg/dL — ABNORMAL HIGH (ref 8–23)
CO2: 21 mmol/L — ABNORMAL LOW (ref 22–32)
Calcium: 8.2 mg/dL — ABNORMAL LOW (ref 8.9–10.3)
Chloride: 103 mmol/L (ref 98–111)
Creatinine, Ser: 0.9 mg/dL (ref 0.61–1.24)
GFR calc Af Amer: >60 mL/min (ref >60)
GFR calc non Af Amer: >60 mL/min (ref >60)
Glucose, Bld: 165 mg/dL — ABNORMAL HIGH (ref 70–99)
Potassium: 3.9 mmol/L (ref 3.5–5.1)
Sodium: 135 mmol/L (ref 135–145)
Total Bilirubin: 0.6 mg/dL (ref 0.3–1.2)
Total Protein: 6.2 g/dL — ABNORMAL LOW (ref 6.5–8.1)

## 2018-11-08 LAB — FERRITIN: Ferritin: 3581 ng/mL — ABNORMAL HIGH (ref 24–336)

## 2018-11-08 LAB — C-REACTIVE PROTEIN: CRP: 4.5 mg/dL — ABNORMAL HIGH (ref <1.0)

## 2018-11-08 LAB — D-DIMER, QUANTITATIVE: D-Dimer, Quant: 1.1 ug/mL-FEU — ABNORMAL HIGH (ref 0.00–0.50)

## 2018-11-08 MED ORDER — AZITHROMYCIN 250 MG PO TABS
500.0000 mg | ORAL_TABLET | Freq: Every day | ORAL | Status: DC
  Administered 2018-11-09 – 2018-11-11 (×3): 500 mg via ORAL
  Filled 2018-11-08 (×3): qty 2

## 2018-11-08 MED ORDER — DEXAMETHASONE SODIUM PHOSPHATE 10 MG/ML IJ SOLN
6.0000 mg | INTRAMUSCULAR | Status: DC
  Administered 2018-11-09 – 2018-11-10 (×2): 6 mg via INTRAVENOUS
  Filled 2018-11-08 (×2): qty 1

## 2018-11-08 MED ORDER — IPRATROPIUM-ALBUTEROL 20-100 MCG/ACT IN AERS
1.0000 | INHALATION_SPRAY | Freq: Four times a day (QID) | RESPIRATORY_TRACT | Status: DC
  Administered 2018-11-09 – 2018-11-11 (×9): 1 via RESPIRATORY_TRACT
  Filled 2018-11-08: qty 4

## 2018-11-08 NOTE — Progress Notes (Signed)
PROGRESS NOTE    Manuel Friedman  JJK:[id number] DOB: [birth year] DOA: 11/06/2018 PCP: [name], MD   Brief Narrative:  78 y.o. WM PMHx HTN, CAD, skin cancer, arthritis, remote tobacco abuse, and depression;   Presents with complaints of fever and weakness.  He had gone out with his brother and sister-in-law a week and a half ago.  A few days later he stated that his sister-in-law had tested positive for corona virus.  At home he had developed fevers up to 101 F and went to his primary care provider on 8/10 requesting to be tested at that time.  The test came back positive.  Since that time he reports that he has been getting progressively weaker and weaker.  He has been still having intermittent fevers utilizing Tylenol at home.  Reports having decreased appetite due to a continued bad taste in his mouth.  Other associated symptoms include fall a few days ago, mildly productive cough, nausea, and vomiting.  Denies having any significant shortness of breath or pain symptoms recent fall.  He reports a 50 smoking pack year history of tobacco use, but reports quitting over 20 years ago.  Baseline patient reports that he does not require oxygen or need inhalers.  ED Course: On admission into the emergency department patient was noted to be afebrile, respiration rate 25, blood pressures 107/60, and O2 saturations reported to be maintained on 2 L nasal cannula oxygen.  O2 saturations reported to be as low as 88-89% on room air.  Labs significant for LDH 320, ferritin 1469, CRP 9.6, lactic acid 1.7, d-dimer 1.41, BNP 67.2, and fibrinogen 755.  Chest x-ray showed cardiomegaly with prominence concerning for edema versus atypical infection   Subjective: 8/20 A/O x4, positive weakness but states wife is at home to help him.  Negative CP, negative N/V, negative abdominal pain.  Not on home O2.  Wife positive COVID quarantined for last 10 days.   Assessment & Plan:   Principal Problem:   COVID-19  virus infection Active Problems:   Hypertension   Hyperlipidemia   Depression   Generalized weakness   Principal Problem:   COVID-19 virus infection Active Problems:   Hypertension   Hyperlipidemia   Depression   Generalized weakness  COVID 19 infection  - CXR consistent with coinfection however patient not hypoxic on RA - Procalcitonin 0.53 complete 5-day course antibiotics - Decadron 6 mg daily - Continue COVID vitamins per protocol - Combivent QID Recent Labs  Lab 11/06/18 0934 11/07/18 0230 11/08/18 0355  CRP 9.6* 11.1* 4.5*   Recent Labs  Lab 11/06/18 0934 11/07/18 0230 11/08/18 0355  DDIMER 1.41* 1.20* 1.10*    Generalized weakness -8/20 PT/OT consult: Evaluate patient for SNF vs home health -8/20 social work consult: Does patient qualify for home health, home nursing aide?  Essential HTN - Continue Imdur and Cozaar  HLD - Continue Lipitor - Lipid panel pending  Depression - Lexapro      DVT prophylaxis: Lovenox Code Status: Full Family Communication: None Disposition Plan: Discharge next 24 to 48 hours   Consultants:  None   Procedures/Significant Events:     I have personally reviewed and interpreted all radiology studies and my findings are as above.  VENTILATOR SETTINGS:    Cultures   Antimicrobials: Anti-infectives (From admission, onward)   Start     Stop   11/09/18 1000  azithromycin (ZITHROMAX) tablet 500 mg         11/08/18 1000  azithromycin (ZITHROMAX) 500  mg in sodium chloride 0.9 % 250 mL IVPB  Status:  Discontinued     11/08/18 1331   11/06/18 1145  azithromycin (ZITHROMAX) 500 mg in sodium chloride 0.9 % 250 mL IVPB  Status:  Discontinued     11/07/18 1551       Devices    LINES / TUBES:      Continuous Infusions: . azithromycin       Objective: Vitals:   11/07/18 0830 11/07/18 1638 11/07/18 1943 11/08/18 0452  BP:  107/66 116/71 123/68  Pulse:  79 80 78  Resp:  (!) 30 (!) 25 (!) 25   Temp:  97.7 F (36.5 C) 98.9 F (37.2 C) 98.4 F (36.9 C)  TempSrc:  Axillary Oral Oral  SpO2: (!) 88% (!) 83% 93% 92%  Weight:      Height:        Intake/Output Summary (Last 24 hours) at 11/08/2018 2671 Last data filed at 11/08/2018 0350 Gross per 24 hour  Intake 1200 ml  Output 400 ml  Net 800 ml   Filed Weights   11/06/18 0857  Weight: 93 kg    Examination:  General: No acute respiratory distress Eyes: negative scleral hemorrhage, negative anisocoria, negative icterus ENT: Negative Runny nose, negative gingival bleeding, Neck:  Negative scars, masses, torticollis, lymphadenopathy, JVD Lungs: Clear to auscultation bilaterally without wheezes or crackles Cardiovascular: Regular rate and rhythm without murmur gallop or rub normal S1 and S2 Abdomen: negative abdominal pain, nondistended, positive soft, bowel sounds, no rebound, no ascites, no appreciable mass Extremities: No significant cyanosis, clubbing, or edema bilateral lower extremities Skin: Negative rashes, lesions, ulcers Psychiatric:  Negative depression, negative anxiety, negative fatigue, negative mania  Central nervous system:  Cranial nerves II through XII intact, tongue/uvula midline, all extremities muscle strength 5/5, sensation intact throughout, negative dysarthria, negative expressive aphasia, negative receptive aphasia.  .     Data Reviewed: Care during the described time interval was provided by me .  I have reviewed this patient's available data, including medical history, events of note, physical examination, and all test results as part of my evaluation.   CBC: Recent Labs  Lab 11/06/18 0900 11/07/18 0230 11/08/18 0355  WBC 7.4 6.3 8.7  NEUTROABS 6.6 5.5 7.5  HGB 13.3 13.3 12.4*  HCT 38.3* 39.1 36.0*  MCV 89.9 92.2 91.4  PLT 201 230 245   Basic Metabolic Panel: Recent Labs  Lab 11/06/18 0900 11/07/18 0230 11/08/18 0355  NA 134* 136 135  K 3.5 3.8 3.9  CL 104 104 103  CO2 19* 24  21*  GLUCOSE 144* 169* 165*  BUN 20 20 26*  CREATININE 1.12 0.85 0.90  CALCIUM 8.2* 8.2* 8.2*  MG  --  2.4  --   PHOS  --  2.5  --    GFR: Estimated Creatinine Clearance: 73 mL/min (by C-G formula based on SCr of 0.9 mg/dL). Liver Function Tests: Recent Labs  Lab 11/06/18 0900 11/07/18 0230 11/08/18 0355  AST 62* 78* 324*  ALT 52* 65* 310*  ALKPHOS 56 61 66  BILITOT 1.0 0.6 0.6  PROT 6.5 6.9 6.2*  ALBUMIN 2.9* 2.9* 2.7*   No results for input(s): LIPASE, AMYLASE in the last 168 hours. No results for input(s): AMMONIA in the last 168 hours. Coagulation Profile: Recent Labs  Lab 11/06/18 0900  INR 1.0   Cardiac Enzymes: Recent Labs  Lab 11/07/18 0230  CKTOTAL 103   BNP (last 3 results) No results for input(s): PROBNP in  the last 8760 hours. HbA1C: No results for input(s): HGBA1C in the last 72 hours. CBG: No results for input(s): GLUCAP in the last 168 hours. Lipid Profile: Recent Labs    11/06/18 0934 11/07/18 0230  TRIG 66 77   Thyroid Function Tests: No results for input(s): TSH, T4TOTAL, FREET4, T3FREE, THYROIDAB in the last 72 hours. Anemia Panel: Recent Labs    11/07/18 0230 11/08/18 0355  FERRITIN 1,819* 3,581*   Urine analysis:    Component Value Date/Time   COLORURINE AMBER (A) 11/06/2018 0910   APPEARANCEUR HAZY (A) 11/06/2018 0910   LABSPEC 1.035 (H) 11/06/2018 0910   PHURINE 5.0 11/06/2018 0910   GLUCOSEU NEGATIVE 11/06/2018 0910   HGBUR NEGATIVE 11/06/2018 0910   BILIRUBINUR NEGATIVE 11/06/2018 0910   KETONESUR NEGATIVE 11/06/2018 0910   PROTEINUR 100 (A) 11/06/2018 0910   NITRITE NEGATIVE 11/06/2018 0910   LEUKOCYTESUR NEGATIVE 11/06/2018 0910   Sepsis Labs: @LABRCNTIP (procalcitonin:4,lacticidven:4)  ) Recent Results (from the past 240 hour(s))  Novel Coronavirus, NAA (hospital order; send-out to ref lab)     Status: Abnormal   Collection Time: 10/29/18  1:01 PM   Specimen: Nasopharyngeal Swab; Respiratory  Result Value Ref  Range Status   SARS-CoV-2, NAA DETECTED (A) NOT DETECTED Final    Comment: CRITICAL RESULT CALLED TO, READ BACK BY AND VERIFIED WITH: RN TINA GOSH [phone] FCP (NOTE)                  Client Requested Flag This test was developed and its performance characteristics determined by [employer]. This test has not been FDA cleared or approved. This test has been authorized by FDA under an Emergency Use Authorization (EUA). This test is only authorized for the duration of time the declaration that circumstances exist justifying the authorization of the emergency use of in vitro diagnostic tests for detection of SARS-CoV-2 virus and/or diagnosis of COVID-19 infection under section 564(b)(1) of the Act, 21 U.S.C. 916XIH-0(T)(8), unless the authorization is terminated or revoked sooner. When diagnostic testing is negative, the possibility of a false negative result should be considered in the context of a patient's recent exposures and the presence of clinical signs and symptoms consistent with COVID-19. An individual without symptoms of C OVID-19 and who is not shedding SARS-CoV-2 virus would expect to have a negative (not detected) result in this assay. Performed At: [facility] [address] [place], [place] [id number] Rush Farmer MD ZP:[id number]    [place]  Final    Comment: Performed at [facility], [place] [address] [place], [place] [address]  Culture, blood (Routine x 2)     Status: None (Preliminary result)   Collection Time: 11/06/18  9:36 AM   Specimen: BLOOD  Result Value Ref Range Status   Specimen Description BLOOD SITE NOT SPECIFIED  Final   Special Requests   Final    BOTTLES DRAWN AEROBIC AND ANAEROBIC Blood Culture adequate volume   Culture   Final    NO GROWTH 2 DAYS Performed at [facility], [address], [place], [place] [address]    Report Status PENDING  Incomplete  Culture, blood (Routine x 2)      Status: None (Preliminary result)   Collection Time: 11/06/18  9:50 AM   Specimen: BLOOD RIGHT ARM  Result Value Ref Range Status   Specimen Description BLOOD RIGHT ARM  Final   Special Requests   Final    BOTTLES DRAWN AEROBIC AND ANAEROBIC Blood Culture  adequate volume   Culture   Final    NO GROWTH 2 DAYS Performed at [facility], [place] [address], [place], [place] [address]    Report Status PENDING  Incomplete         Radiology Studies: Dg Chest Portable 1 View  Result Date: 11/06/2018 CLINICAL DATA:  Shortness of breath EXAM: PORTABLE CHEST 1 VIEW COMPARISON:  March 22, 2017 FINDINGS: There is mild cardiomegaly with pulmonary vascularity within normal limits. There is diffuse interstitial prominence, likely edema. No consolidation. No pleural effusion. No adenopathy. No bone lesions. IMPRESSION: Mild cardiomegaly with diffuse interstitial prominence, likely interstitial edema. Suspect a degree of congestive heart failure. Atypical organism pneumonia throughout the lungs bilaterally could present in this manner and is a differential consideration. No evident adenopathy. Electronically Signed   By: Lowella Grip III M.D.   On: 11/06/2018 09:24        Scheduled Meds: . albuterol  2 puff Inhalation Q6H  . atorvastatin  80 mg Oral Daily  . dexamethasone (DECADRON) injection  10 mg Intravenous Q24H  . enoxaparin (LOVENOX) injection  40 mg Subcutaneous Q24H  . escitalopram  10 mg Oral Daily  . famotidine  20 mg Oral BID  . isosorbide mononitrate  60 mg Oral Daily  . losartan  25 mg Oral Daily  . sodium chloride flush  3 mL Intravenous Q12H  . vitamin C  500 mg Oral Daily  . zinc sulfate  220 mg Oral Daily  . zolpidem  5 mg Oral QHS   Continuous Infusions: . azithromycin       LOS: 2 days   The patient is critically ill with multiple organ systems failure and requires high complexity decision making for assessment and support, frequent evaluation and titration  of therapies, application of advanced monitoring technologies and extensive interpretation of multiple databases. Critical Care Time devoted to patient care services described in this note  Time spent: 40 minutes    , Geraldo Docker, MD [facility] Pager [phone]  If 7PM-7AM, please contact night-coverage [email or web address] Password [email or web address] 11/08/2018, 8:52 AM

## 2018-11-08 NOTE — Evaluation (Signed)
Physical Therapy Evaluation Patient Details Name: Manuel Friedman MRN: [id number] DOB: 1940/11/26 Today's Date: 11/08/2018   History of Present Illness  y.o. male with history of HTN, CAD, embolectomy of fem. artery,skin cancer, arthritis, remote tobacco abuse, and depression, presents with  fever and weakness.  Patient tested positive for COVID-19 after exposure to family member  Clinical Impression  The patient Ambulated x 150' on 2 L [place], min hand hold assist as pt. Is unsteady for dyn amic balance. The patient did ambulate for ~60' on RA with SpO2 dropping to 85%.  Pt's HR 112. Patient did require frequent cues for safety and sequencing and what would be done with lines and tubes during  Mobility. Patient's bed and bath are second level so will need to practice a few "steps". Patient does state that he has gone up and down  On his buttocks in the past. Pt admitted with above diagnosis.  Pt currently with functional limitations due to the deficits listed below (see PT Problem List). Pt will benefit from skilled PT to increase their independence and safety with mobility to allow discharge to the venue listed below.    's    Follow Up Recommendations No PT follow up    Equipment Recommendations  None recommended by PT    Recommendations for Other Services       Precautions / Restrictions Precautions Precautions: Fall Precaution Comments: monitor sats      Mobility  Bed Mobility Overal bed mobility: Needs Assistance Bed Mobility: Sidelying to Sit   Sidelying to sit: Supervision       General bed mobility comments: extra time and  extra effort  Transfers Overall transfer level: Needs assistance Equipment used: None Transfers: Sit to/from Stand Sit to Stand: Supervision            Ambulation/Gait Ambulation/Gait assistance: Min assist Gait Distance (Feet): 150 Feet Assistive device: 1 person hand held assist Gait Pattern/deviations: Step-through pattern;Staggering  right;Staggering left Gait velocity: decr   General Gait Details: requires steady assist for balance  in open space. gait is slow and guarded  Stairs            Wheelchair Mobility    Modified Rankin (Stroke Patients Only)       Balance Overall balance assessment: Needs assistance Sitting-balance support: No upper extremity supported Sitting balance-Leahy Scale: Good     Standing balance support: During functional activity;No upper extremity supported Standing balance-Leahy Scale: Fair Standing balance comment: fair static, poor dynamic.                             Pertinent Vitals/Pain      Home Living Family/patient expects to be discharged to:: Private residence Living Arrangements: Spouse/significant other Available Help at Discharge: Family Type of Home: House Home Access: Stairs to enter   Technical brewer of Steps: 3-4 Home Layout: Two level Home Equipment: None      Prior Function Level of Independence: Independent               Hand Dominance        Extremity/Trunk Assessment   Upper Extremity Assessment Upper Extremity Assessment: Defer to OT evaluation    Lower Extremity Assessment Lower Extremity Assessment: Generalized weakness    Cervical / Trunk Assessment Cervical / Trunk Assessment: Normal  Communication   Communication: No difficulties  Cognition Arousal/Alertness: Awake/alert Behavior During Therapy: WFL for tasks assessed/performed Overall Cognitive Status: No family/caregiver present  to determine baseline cognitive functioning Area of Impairment: Problem solving;Safety/judgement                             Problem Solving: Slow processing;Difficulty sequencing General Comments: does present with decreased awareness/memory when pt. texplained what the PT plan was, to gather all of the lines and take thenm with [place] for the walk.Asked several times what to do with the lines. had to reemphsie  to call nurse for moving out of chair      General Comments      Exercises     Assessment/Plan    PT Assessment Patient needs continued PT services  PT Problem List Decreased strength;Decreased mobility;Decreased safety awareness;Decreased knowledge of precautions;Decreased activity tolerance;Decreased cognition;Cardiopulmonary status limiting activity;Decreased balance       PT Treatment Interventions DME instruction;Therapeutic activities;Cognitive remediation;Gait training;Therapeutic exercise;Patient/family education;Stair training;Functional mobility training    PT Goals (Current goals can be found in the Care Plan section)  Acute Rehab PT Goals Patient Stated Goal: to go home, get upstairs PT Goal Formulation: With patient Time For Goal Achievement: 11/22/18 Potential to Achieve Goals: Good    Frequency Min 3X/week   Barriers to discharge        Co-evaluation               AM-PAC PT "6 Clicks" Mobility  Outcome Measure Help needed turning from your back to your side while in a flat bed without using bedrails?: None Help needed moving from lying on your back to sitting on the side of a flat bed without using bedrails?: None Help needed moving to and from a bed to a chair (including a wheelchair)?: A Little Help needed standing up from a chair using your arms (e.g., wheelchair or bedside chair)?: A Little Help needed to walk in hospital room?: A Little Help needed climbing 3-5 steps with a railing? : A Lot 6 Click Score: 19    End of Session Equipment Utilized During Treatment: Oxygen Activity Tolerance: Patient tolerated treatment well Patient left: in chair;with call bell/phone within reach Nurse Communication: Mobility status PT Visit Diagnosis: Unsteadiness on feet (R26.81);Difficulty in walking, not elsewhere classified (R26.2)    Time: 1220-1300 PT Time Calculation (min) (ACUTE ONLY): 40 min   Charges:   PT Evaluation $PT Eval Moderate  Complexity: 1 Mod PT Treatments $Gait Training: 23-37 mins        Tresa Endo PT Acute Rehabilitation Services  Office [phone]   Claretha Cooper 11/08/2018, 2:03 PM

## 2018-11-09 DIAGNOSIS — R748 Abnormal levels of other serum enzymes: Secondary | ICD-10-CM | POA: Diagnosis present

## 2018-11-09 LAB — CBC WITH DIFFERENTIAL/PLATELET
Abs Immature Granulocytes: 0.05 10*3/uL (ref 0.00–0.07)
Basophils Absolute: 0 10*3/uL (ref 0.0–0.1)
Basophils Relative: 0 %
Eosinophils Absolute: 0 10*3/uL (ref 0.0–0.5)
Eosinophils Relative: 0 %
HCT: 35.1 % — ABNORMAL LOW (ref 39.0–52.0)
Hemoglobin: 12 g/dL — ABNORMAL LOW (ref 13.0–17.0)
Immature Granulocytes: 1 %
Lymphocytes Relative: 11 %
Lymphs Abs: 0.9 10*3/uL (ref 0.7–4.0)
MCH: 31 pg (ref 26.0–34.0)
MCHC: 34.2 g/dL (ref 30.0–36.0)
MCV: 90.7 fL (ref 80.0–100.0)
Monocytes Absolute: 0.3 10*3/uL (ref 0.1–1.0)
Monocytes Relative: 4 %
Neutro Abs: 6.4 10*3/uL (ref 1.7–7.7)
Neutrophils Relative %: 84 %
Platelets: 277 10*3/uL (ref 150–400)
RBC: 3.87 MIL/uL — ABNORMAL LOW (ref 4.22–5.81)
RDW: 13.2 % (ref 11.5–15.5)
WBC: 7.6 10*3/uL (ref 4.0–10.5)
nRBC: 0 % (ref 0.0–0.2)

## 2018-11-09 LAB — LEGIONELLA PNEUMOPHILA SEROGP 1 UR AG: L. pneumophila Serogp 1 Ur Ag: NEGATIVE

## 2018-11-09 LAB — COMPREHENSIVE METABOLIC PANEL
ALT: 443 U/L — ABNORMAL HIGH (ref 0–44)
AST: 326 U/L — ABNORMAL HIGH (ref 15–41)
Albumin: 2.6 g/dL — ABNORMAL LOW (ref 3.5–5.0)
Alkaline Phosphatase: 68 U/L (ref 38–126)
Anion gap: 8 (ref 5–15)
BUN: 26 mg/dL — ABNORMAL HIGH (ref 8–23)
CO2: 22 mmol/L (ref 22–32)
Calcium: 8.4 mg/dL — ABNORMAL LOW (ref 8.9–10.3)
Chloride: 107 mmol/L (ref 98–111)
Creatinine, Ser: 0.82 mg/dL (ref 0.61–1.24)
GFR calc Af Amer: >60 mL/min (ref >60)
GFR calc non Af Amer: >60 mL/min (ref >60)
Glucose, Bld: 166 mg/dL — ABNORMAL HIGH (ref 70–99)
Potassium: 4.3 mmol/L (ref 3.5–5.1)
Sodium: 137 mmol/L (ref 135–145)
Total Bilirubin: 0.8 mg/dL (ref 0.3–1.2)
Total Protein: 6 g/dL — ABNORMAL LOW (ref 6.5–8.1)

## 2018-11-09 LAB — LIPID PANEL
Cholesterol: 78 mg/dL (ref 0–200)
HDL: 12 mg/dL — ABNORMAL LOW (ref >40)
LDL Cholesterol: 45 mg/dL (ref 0–99)
Total CHOL/HDL Ratio: 6.5 RATIO
Triglycerides: 107 mg/dL (ref <150)
VLDL: 21 mg/dL (ref 0–40)

## 2018-11-09 LAB — D-DIMER, QUANTITATIVE: D-Dimer, Quant: 1.4 ug/mL-FEU — ABNORMAL HIGH (ref 0.00–0.50)

## 2018-11-09 LAB — C-REACTIVE PROTEIN: CRP: 2.1 mg/dL — ABNORMAL HIGH (ref <1.0)

## 2018-11-09 LAB — MAGNESIUM: Magnesium: 2.3 mg/dL (ref 1.7–2.4)

## 2018-11-09 LAB — INTERLEUKIN-6, PLASMA: Interleukin-6, Plasma: 15.4 pg/mL — ABNORMAL HIGH (ref 0.0–12.2)

## 2018-11-09 LAB — FERRITIN: Ferritin: 4998 ng/mL — ABNORMAL HIGH (ref 24–336)

## 2018-11-09 NOTE — Evaluation (Signed)
Occupational Therapy Evaluation Patient Details Name: Manuel Friedman MRN: [id number] DOB: 26-Dec-1940 Today's Date: 11/09/2018    History of Present Illness 78 y.o. male with history of HTN, CAD, embolectomy of fem. artery,skin cancer, arthritis, remote tobacco abuse, and depression, presents with  fever and weakness.  Patient tested positive for COVID-19 after exposure to family member   Clinical Impression   PTA Pt independent in ADL and mobility, enjoyed walking 3 miles a day and house projects. Today Pt was min guard for ambulation to sink. Pt able to perform full bath (including shower cap) at min guard/set up level with seated rest breaks as Pt does get DOE 2/4 with prolonged activity in standing. EDUCATED patient in importance of sitting, energy conservation. Pt will benefit from continued OT acutely, and will have 24 hour support of his wife (A symptomatic COVID+) at dc. Next session to focus on energy conservation education (please take handout)     Follow Up Recommendations  Supervision/Assistance - 24 hour    Equipment Recommendations  3 in 1 bedside commode    Recommendations for Other Services       Precautions / Restrictions Precautions Precautions: Fall Precaution Comments: monitor sats Restrictions Weight Bearing Restrictions: No      Mobility Bed Mobility               General bed mobility comments: OOB in recliner at beginning and end of session  Transfers Overall transfer level: Needs assistance Equipment used: None Transfers: Sit to/from Stand Sit to Stand: Supervision         General transfer comment: from recliner and 3 in1 by sink    Balance Overall balance assessment: Needs assistance Sitting-balance support: No upper extremity supported Sitting balance-Leahy Scale: Good     Standing balance support: During functional activity;No upper extremity supported Standing balance-Leahy Scale: Fair Standing balance comment: improving to  fair overall                           ADL either performed or assessed with clinical judgement   ADL Overall ADL's : Needs assistance/impaired Eating/Feeding: Independent   Grooming: Wash/dry hands;Wash/dry face;Oral care;Min guard;Standing;Sitting;Set up Grooming Details (indicate cue type and reason): requires sitting due to DOE 2/4 with standing activity at the sink, desaurates to 85% but able to bring back up with seated rest break and PLB Upper Body Bathing: Set up   Lower Body Bathing: Min guard   Upper Body Dressing : Set up;Sitting   Lower Body Dressing: Minimal assistance;Sit to/from stand   Toilet Transfer: Min guard;Ambulation Toilet Transfer Details (indicate cue type and reason): no DME Toileting- Clothing Manipulation and Hygiene: Min guard;Sit to/from stand   Tub/ Shower Transfer: Walk-in shower;Min guard;Shower seat;Ambulation   Functional mobility during ADLs: Min guard General ADL Comments: decreased activity tolerance, DOE with prolonged activity     Vision Patient Visual Report: No change from baseline       Perception     Praxis      Pertinent Vitals/Pain Pain Assessment: No/denies pain     Hand Dominance Left   Extremity/Trunk Assessment Upper Extremity Assessment Upper Extremity Assessment: Overall WFL for tasks assessed   Lower Extremity Assessment Lower Extremity Assessment: Generalized weakness   Cervical / Trunk Assessment Cervical / Trunk Assessment: Normal   Communication Communication Communication: No difficulties   Cognition Arousal/Alertness: Awake/alert Behavior During Therapy: WFL for tasks assessed/performed Overall Cognitive Status: Within Functional Limits for tasks assessed  General Comments  pt performed entire bath including shower cap    Exercises     Shoulder Instructions      Home Living Family/patient expects to be discharged to:: Private  residence Living Arrangements: Spouse/significant other Available Help at Discharge: Family;Available 24 hours/day Type of Home: House Home Access: Stairs to enter [employer] of Steps: 3-4   Home Layout: Two level Alternate Level Stairs-Number of Steps: 14 with a landing Alternate Level Stairs-Rails: Right;Left Bathroom Shower/Tub: Occupational psychologist: Handicapped height     Home Equipment: Shower seat - built in          Prior Functioning/Environment Level of Independence: Independent        Comments: enjoys walking 3 miles a day, going to the gym, doing projects around the house, drives,         OT Problem List: Decreased activity tolerance;Impaired balance (sitting and/or standing);Decreased safety awareness;Decreased knowledge of use of DME or AE;Decreased knowledge of precautions;Cardiopulmonary status limiting activity      OT Treatment/Interventions: Self-care/ADL training;Therapeutic exercise;Energy conservation;DME and/or AE instruction;Therapeutic activities;Patient/family education    OT Goals(Current goals can be found in the care plan section) Acute Rehab OT Goals Patient Stated Goal: to go home, get back to daily exercise OT Goal Formulation: With patient Time For Goal Achievement: 11/23/18 Potential to Achieve Goals: Good ADL Goals Pt Will Perform Grooming: with modified independence;standing Pt Will Transfer to Toilet: with modified independence;ambulating Pt Will Perform Toileting - Clothing Manipulation and hygiene: with modified independence;sit to/from stand Pt/caregiver will Perform Home Exercise Program: Both right and left upper extremity;With theraband;Independently;With written HEP provided Additional ADL Goal #1: Pt will recall 3 ways of conserving energy during ADL at independent level  OT Frequency: Min 2X/week   Barriers to D/C:            Co-evaluation              AM-PAC OT "6 Clicks" Daily Activity      Outcome Measure Help from another person eating meals?: None Help from another person taking care of personal grooming?: A Little Help from another person toileting, which includes using toliet, bedpan, or urinal?: A Little Help from another person bathing (including washing, rinsing, drying)?: A Little Help from another person to put on and taking off regular upper body clothing?: None Help from another person to put on and taking off regular lower body clothing?: A Little 6 Click Score: 20   End of Session Nurse Communication: Mobility status  Activity Tolerance: Patient tolerated treatment well Patient left: in chair;with call bell/phone within reach  OT Visit Diagnosis: Other abnormalities of gait and mobility (R26.89);Muscle weakness (generalized) (M62.81)                Time: [id number] OT Time Calculation (min): 63 min Charges:  OT General Charges $OT Visit: 1 Visit OT Evaluation $OT Eval Moderate Complexity: 1 Mod OT Treatments $Self Care/Home Management : 23-37 mins $Therapeutic Activity: 8-22 mins  Hulda Humphrey OTR/L Acute Rehabilitation Services Pager: [phone] Office: [place] 11/09/2018, 1:42 PM

## 2018-11-09 NOTE — Progress Notes (Addendum)
PROGRESS NOTE    Manuel Friedman  [id number] DOB: [birth year] DOA: 11/06/2018 PCP: [name], MD   Brief Narrative:  78 y.o. WM PMHx HTN, CAD, skin cancer, arthritis, remote tobacco abuse, and depression;   Presents with complaints of fever and weakness.  He had gone out with his brother and sister-in-law a week and a half ago.  A few days later he stated that his sister-in-law had tested positive for corona virus.  At home he had developed fevers up to 101 F and went to his primary care provider on 8/10 requesting to be tested at that time.  The test came back positive.  Since that time he reports that he has been getting progressively weaker and weaker.  He has been still having intermittent fevers utilizing Tylenol at home.  Reports having decreased appetite due to a continued bad taste in his mouth.  Other associated symptoms include fall a few days ago, mildly productive cough, nausea, and vomiting.  Denies having any significant shortness of breath or pain symptoms recent fall.  He reports a 50 smoking pack year history of tobacco use, but reports quitting over 20 years ago.  Baseline patient reports that he does not require oxygen or need inhalers.  ED Course: On admission into the emergency department patient was noted to be afebrile, respiration rate 25, blood pressures 107/60, and O2 saturations reported to be maintained on 2 L nasal cannula oxygen.  O2 saturations reported to be as low as 88-89% on room air.  Labs significant for LDH 320, ferritin 1469, CRP 9.6, lactic acid 1.7, d-dimer 1.41, BNP 67.2, and fibrinogen 755.  Chest x-ray showed cardiomegaly with prominence concerning for edema versus atypical infection   Subjective: 8/21 A/O x4, negative CP, negative abdominal pain, negative N/V, negative S OB, negative jaundice.    Assessment & Plan:   Principal Problem:   COVID-19 virus infection Active Problems:   Hypertension   Hyperlipidemia   Depression  Generalized weakness   Elevated liver enzymes   Principal Problem:   COVID-19 virus infection Active Problems:   Hypertension   Hyperlipidemia   Depression   Generalized weakness  COVID 19 infection  - CXR consistent with coinfection however patient not hypoxic on RA - Procalcitonin 0.53 complete 5-day course antibiotics - Decadron 6 mg daily - Continue COVID vitamins per protocol - Combivent QID Recent Labs  Lab 11/06/18 0934 11/07/18 0230 11/08/18 0355 11/09/18 0423  CRP 9.6* 11.1* 4.5* 2.1*   Recent Labs  Lab 11/06/18 0934 11/07/18 0230 11/08/18 0355 11/09/18 0423  DDIMER 1.41* 1.20* 1.10* 1.40*    Generalized weakness -8/20 PT/OT consult: Evaluate patient for SNF vs home health -8/20 social work consult: Does patient qualify for home health, home nursing aide?  Essential HTN - Continue Imdur and Cozaar  HLD - Lipitor (hold) secondary to increased liver enzyme  - Lipid panel pending  Depression  - Lexapro  Elevated liver enzyme - Acute hepatitis panel pending - Autoimmune hepatitis panel (antimicrosomal antibody liver/kidney) pending - Liver ultrasound pending     DVT prophylaxis: Lovenox Code Status: Full Family Communication: 8/21 spoke with patient's wife at length concerning plan of care.  Answered all questions. Disposition Plan: Discharge next 24 to 48 hours   Consultants:  None   Procedures/Significant Events:     I have personally reviewed and interpreted all radiology studies and my findings are as above.  VENTILATOR SETTINGS:    Cultures 8/10 Novel coronavirus positive 8/18 blood RIGHT arm NGTD  8/18 blood site unspecified NGTD 8/21 Autoimmune hepatitis panel (antimicrosomal antibody liver/kidney) pendingte hepatitis panel pending 8/21 acute hepatitis panel pending    Antimicrobials: Anti-infectives (From admission, onward)   Start     Stop   11/09/18 1000  azithromycin (ZITHROMAX) tablet 500 mg          11/08/18 1000  azithromycin (ZITHROMAX) 500 mg in sodium chloride 0.9 % 250 mL IVPB  Status:  Discontinued     11/08/18 1331   11/06/18 1145  azithromycin (ZITHROMAX) 500 mg in sodium chloride 0.9 % 250 mL IVPB  Status:  Discontinued     11/07/18 1551       Devices    LINES / TUBES:      Continuous Infusions:    Objective: Vitals:   11/09/18 0600 11/09/18 0615 11/09/18 0700 11/09/18 0800  BP:  137/82  140/75  Pulse: 64  (!) 57 79  Resp: (!) 29 (!) 23 (!) 31 18  Temp:  97.7 F (36.5 C)  98 F (36.7 C)  TempSrc:  Oral  Oral  SpO2: 91%  90% 90%  Weight:      Height:        Intake/Output Summary (Last 24 hours) at 11/09/2018 1653 Last data filed at 11/09/2018 0800 Gross per 24 hour  Intake 480 ml  Output 650 ml  Net -170 ml   Filed Weights   11/06/18 0857  Weight: 93 kg    Examination:  General: A/O x4, no acute respiratory distress Eyes: negative scleral hemorrhage, negative anisocoria, negative icterus ENT: Negative Runny nose, negative gingival bleeding, Neck:  Negative scars, masses, torticollis, lymphadenopathy, JVD Lungs: Clear to auscultation bilaterally without wheezes or crackles Cardiovascular: Regular rate and rhythm without murmur gallop or rub normal S1 and S2 Abdomen: negative abdominal pain, nondistended, positive soft, bowel sounds, no rebound, no ascites, no appreciable mass Extremities: No significant cyanosis, clubbing, or edema bilateral lower extremities Skin: Negative rashes, lesions, ulcers Psychiatric:  Negative depression, negative anxiety, negative fatigue, negative mania  Central nervous system:  Cranial nerves II through XII intact, tongue/uvula midline, all extremities muscle strength 5/5, sensation intact throughout, negative dysarthria, negative expressive aphasia, negative receptive aphasia.  .     Data Reviewed: Care during the described time interval was provided by me .  I have reviewed this patient's available data,  including medical history, events of note, physical examination, and all test results as part of my evaluation.   CBC: Recent Labs  Lab 11/06/18 0900 11/07/18 0230 11/08/18 0355 11/09/18 0423  WBC 7.4 6.3 8.7 7.6  NEUTROABS 6.6 5.5 7.5 6.4  HGB 13.3 13.3 12.4* 12.0*  HCT 38.3* 39.1 36.0* 35.1*  MCV 89.9 92.2 91.4 90.7  PLT 201 230 259 [id number]   Basic Metabolic Panel: Recent Labs  Lab 11/06/18 0900 11/07/18 0230 11/08/18 0355 11/09/18 0423  NA 134* 136 135 137  K 3.5 3.8 3.9 4.3  CL 104 104 103 107  CO2 19* 24 21* 22  GLUCOSE 144* 169* 165* 166*  BUN 20 20 26* 26*  CREATININE 1.12 0.85 0.90 0.82  CALCIUM 8.2* 8.2* 8.2* 8.4*  MG  --  2.4  --  2.3  PHOS  --  2.5  --   --    GFR: Estimated Creatinine Clearance: 80.2 mL/min (by C-G formula based on SCr of 0.82 mg/dL). Liver Function Tests: Recent Labs  Lab 11/06/18 0900 11/07/18 0230 11/08/18 0355 11/09/18 0423  AST 62* 78* 324* 326*  ALT 52* 65* 310*  443*  ALKPHOS 56 61 66 68  BILITOT 1.0 0.6 0.6 0.8  PROT 6.5 6.9 6.2* 6.0*  ALBUMIN 2.9* 2.9* 2.7* 2.6*   No results for input(s): LIPASE, AMYLASE in the last 168 hours. No results for input(s): AMMONIA in the last 168 hours. Coagulation Profile: Recent Labs  Lab 11/06/18 0900  INR 1.0   Cardiac Enzymes: Recent Labs  Lab 11/07/18 0230  CKTOTAL 103   BNP (last 3 results) No results for input(s): PROBNP in the last 8760 hours. HbA1C: No results for input(s): HGBA1C in the last 72 hours. CBG: No results for input(s): GLUCAP in the last 168 hours. Lipid Profile: Recent Labs    11/07/18 0230 11/09/18 0423  CHOL  --  78  HDL  --  12*  LDLCALC  --  45  TRIG 77 107  CHOLHDL  --  6.5   Thyroid Function Tests: No results for input(s): TSH, T4TOTAL, FREET4, T3FREE, THYROIDAB in the last 72 hours. Anemia Panel: Recent Labs    11/08/18 0355 11/09/18 0423  FERRITIN 3,581* 4,998*   Urine analysis:    Component Value Date/Time   COLORURINE AMBER (A)  11/06/2018 0910   APPEARANCEUR HAZY (A) 11/06/2018 0910   LABSPEC 1.035 (H) 11/06/2018 0910   PHURINE 5.0 11/06/2018 0910   GLUCOSEU NEGATIVE 11/06/2018 0910   HGBUR NEGATIVE 11/06/2018 0910   BILIRUBINUR NEGATIVE 11/06/2018 0910   KETONESUR NEGATIVE 11/06/2018 0910   PROTEINUR 100 (A) 11/06/2018 0910   NITRITE NEGATIVE 11/06/2018 0910   LEUKOCYTESUR NEGATIVE 11/06/2018 0910   Sepsis Labs: @LABRCNTIP (procalcitonin:4,lacticidven:4)  ) Recent Results (from the past 240 hour(s))  Culture, blood (Routine x 2)     Status: None (Preliminary result)   Collection Time: 11/06/18  9:36 AM   Specimen: BLOOD  Result Value Ref Range Status   Specimen Description BLOOD SITE NOT SPECIFIED  Final   Special Requests   Final    BOTTLES DRAWN AEROBIC AND ANAEROBIC Blood Culture adequate volume   Culture   Final    NO GROWTH 3 DAYS Performed at [facility], [place] [address] [place], [place] [address]    Report Status PENDING  Incomplete  Culture, blood (Routine x 2)     Status: None (Preliminary result)   Collection Time: 11/06/18  9:50 AM   Specimen: BLOOD RIGHT ARM  Result Value Ref Range Status   Specimen Description BLOOD RIGHT ARM  Final   Special Requests   Final    BOTTLES DRAWN AEROBIC AND ANAEROBIC Blood Culture adequate volume   Culture   Final    NO GROWTH 3 DAYS Performed at [facility], [address], [place],  [address]    Report Status PENDING  Incomplete         Radiology Studies: No results found.      Scheduled Meds: . azithromycin  500 mg Oral Daily  . dexamethasone (DECADRON) injection  6 mg Intravenous Q24H  . enoxaparin (LOVENOX) injection  40 mg Subcutaneous Q24H  . escitalopram  10 mg Oral Daily  . famotidine  20 mg Oral BID  . Ipratropium-Albuterol  1 puff Inhalation QID  . isosorbide mononitrate  60 mg Oral Daily  . losartan  25 mg Oral Daily  . sodium chloride flush  3 mL Intravenous Q12H  . vitamin C  500 mg Oral Daily  .  zinc sulfate  220 mg Oral Daily  . zolpidem  5 mg Oral QHS   Continuous Infusions:  LOS: 3 days   The patient is critically ill with multiple organ systems failure and requires high complexity decision making for assessment and support, frequent evaluation and titration of therapies, application of advanced monitoring technologies and extensive interpretation of multiple databases. Critical Care Time devoted to patient care services described in this note  Time spent: 40 minutes    Carry Weesner, Geraldo Docker, MD [facility] Pager [phone]  If 7PM-7AM, please contact night-coverage [email or web address] Password [facility] 11/09/2018, 4:53 PM

## 2018-11-09 NOTE — Progress Notes (Signed)
This RN spoke with patient wife updating on patient condition. Wife Francee Nodal concerned about patient strength and getting into the house after discharge. Wife would like to speak with MD Case management about options at home as far as home health.

## 2018-11-09 NOTE — Progress Notes (Signed)
Physical Therapy Treatment Patient Details Name: [name] MRN: [id number] DOB: [birth year] Today's Date: 11/09/2018    History of Present Illness 78 y.o. male with history of HTN, CAD, embolectomy of fem. artery,skin cancer, arthritis, remote tobacco abuse, and depression, presents with  fever and weakness.  Patient tested positive for COVID-19 after exposure to family member    PT Comments    Pt is progressing well. He is cooperative but frustrated about being in hospital.  Practiced stairs(1 step 6x's), pt with moderate dyspnea after stairs but recovered in ~ 2 to 3 minutes, SpO2 stable; amb after rest period with SpO2= 99---86---91% on RA, HR 70s to 80s. Pt reports he feels stronger than previous session with PT.  Continue PT POC  Follow Up Recommendations  No PT follow up     Equipment Recommendations  None recommended by PT    Recommendations for Other Services       Precautions / Restrictions Precautions Precautions: Fall Precaution Comments: monitor sats Restrictions Weight Bearing Restrictions: No    Mobility  Bed Mobility               General bed mobility comments: OOB in recliner at beginning and end of session  Transfers Overall transfer level: Needs assistance Equipment used: None Transfers: Sit to/from Stand Sit to Stand: Supervision         General transfer comment: for safety and line management  Ambulation/Gait Ambulation/Gait assistance: Min guard;Min assist Gait Distance (Feet): 280 Feet Assistive device: 1 person hand held assist;None Gait Pattern/deviations: Step-through pattern;Drifts right/left Gait velocity: decr   General Gait Details: intermittent steadying assist/HHA, LOB x1 with pt able to self recover   Stairs Stairs: Yes Stairs assistance: Min guard;Min assist Stair Management: No rails(HHA) Number of Stairs: 1(x6) General stair comments: cues for safety, lead leg alternated (up down and step over, required assist  for balance when descending on step   Wheelchair Mobility    Modified Rankin (Stroke Patients Only)       Balance Overall balance assessment: Needs assistance Sitting-balance support: No upper extremity supported Sitting balance-Leahy Scale: Good     Standing balance support: During functional activity;No upper extremity supported Standing balance-Leahy Scale: Fair Standing balance comment: able to maintain static stand without UE support,  intermittent unilateral UE support for dynamic tasks                            Cognition Arousal/Alertness: Awake/alert Behavior During Therapy: WFL for tasks assessed/performed Overall Cognitive Status: Within Functional Limits for tasks assessed                                 General Comments: occasional decr awareness of lines, safety awareness improved from previous session      Exercises General Exercises - Lower Extremity Ankle Circles/Pumps: AROM;Both;10 reps    General Comments General comments (skin integrity, edema, etc.): pt performed entire bath including shower cap      Pertinent Vitals/Pain Pain Assessment: No/denies pain    Home Living Family/patient expects to be discharged to:: Private residence Living Arrangements: Spouse/significant other Available Help at Discharge: Family;Available 24 hours/day Type of Home: House Home Access: Stairs to enter   Home Layout: Two level Home Equipment: Shower seat - built in      Prior Function Level of Independence: Independent      Comments: enjoys walking 3 miles  a day, going to the gym, doing projects around the house, drives,    PT Goals (current goals can now be found in the care plan section) Acute Rehab PT Goals Patient Stated Goal: to go home, get back to daily exercise    Frequency    Min 3X/week      PT Plan Current plan remains appropriate    Co-evaluation              AM-PAC PT "6 Clicks" Mobility   Outcome  Measure  Help needed turning from your back to your side while in a flat bed without using bedrails?: None Help needed moving from lying on your back to sitting on the side of a flat bed without using bedrails?: None Help needed moving to and from a bed to a chair (including a wheelchair)?: A Little Help needed standing up from a chair using your arms (e.g., wheelchair or bedside chair)?: A Little Help needed to walk in hospital room?: A Little Help needed climbing 3-5 steps with a railing? : A Little 6 Click Score: 20    End of Session Equipment Utilized During Treatment: Gait belt;Other (comment)(potable monitor O2 & HR) Activity Tolerance: Patient tolerated treatment well Patient left: in chair;with call bell/phone within reach   PT Visit Diagnosis: Unsteadiness on feet (R26.81);Difficulty in walking, not elsewhere classified (R26.2)     Time:  -     Charges:                        Manuel Friedman, PT  Pager: [phone] Acute Rehab Dept [facility]): [phone]   11/09/2018    [facility] 11/09/2018, 2:12 PM

## 2018-11-09 NOTE — Plan of Care (Signed)
  Problem: Clinical Measurements: Goal: Ability to maintain clinical measurements within normal limits will improve Outcome: Progressing Goal: Will remain free from infection Outcome: Progressing   Problem: Activity: Goal: Risk for activity intolerance will decrease Outcome: Progressing   Problem: Nutrition: Goal: Adequate nutrition will be maintained Outcome: Progressing   Problem: Coping: Goal: Level of anxiety will decrease Outcome: Progressing   

## 2018-11-10 ENCOUNTER — Inpatient Hospital Stay (HOSPITAL_COMMUNITY): Payer: Medicare HMO

## 2018-11-10 LAB — CBC WITH DIFFERENTIAL/PLATELET
Abs Immature Granulocytes: 0.04 10*3/uL (ref 0.00–0.07)
Basophils Absolute: 0 10*3/uL (ref 0.0–0.1)
Basophils Relative: 0 %
Eosinophils Absolute: 0 10*3/uL (ref 0.0–0.5)
Eosinophils Relative: 0 %
HCT: 36 % — ABNORMAL LOW (ref 39.0–52.0)
Hemoglobin: 12.4 g/dL — ABNORMAL LOW (ref 13.0–17.0)
Immature Granulocytes: 1 %
Lymphocytes Relative: 16 %
Lymphs Abs: 1.1 10*3/uL (ref 0.7–4.0)
MCH: 31.3 pg (ref 26.0–34.0)
MCHC: 34.4 g/dL (ref 30.0–36.0)
MCV: 90.9 fL (ref 80.0–100.0)
Monocytes Absolute: 0.5 10*3/uL (ref 0.1–1.0)
Monocytes Relative: 8 %
Neutro Abs: 5.1 10*3/uL (ref 1.7–7.7)
Neutrophils Relative %: 75 %
Platelets: 296 10*3/uL (ref 150–400)
RBC: 3.96 MIL/uL — ABNORMAL LOW (ref 4.22–5.81)
RDW: 13.1 % (ref 11.5–15.5)
WBC: 6.8 10*3/uL (ref 4.0–10.5)
nRBC: 0 % (ref 0.0–0.2)

## 2018-11-10 LAB — C-REACTIVE PROTEIN: CRP: 1.4 mg/dL — ABNORMAL HIGH (ref <1.0)

## 2018-11-10 LAB — COMPREHENSIVE METABOLIC PANEL
ALT: 362 U/L — ABNORMAL HIGH (ref 0–44)
AST: 201 U/L — ABNORMAL HIGH (ref 15–41)
Albumin: 2.6 g/dL — ABNORMAL LOW (ref 3.5–5.0)
Alkaline Phosphatase: 69 U/L (ref 38–126)
Anion gap: 8 (ref 5–15)
BUN: 25 mg/dL — ABNORMAL HIGH (ref 8–23)
CO2: 24 mmol/L (ref 22–32)
Calcium: 8.4 mg/dL — ABNORMAL LOW (ref 8.9–10.3)
Chloride: 104 mmol/L (ref 98–111)
Creatinine, Ser: 0.74 mg/dL (ref 0.61–1.24)
GFR calc Af Amer: >60 mL/min (ref >60)
GFR calc non Af Amer: >60 mL/min (ref >60)
Glucose, Bld: 168 mg/dL — ABNORMAL HIGH (ref 70–99)
Potassium: 4.4 mmol/L (ref 3.5–5.1)
Sodium: 136 mmol/L (ref 135–145)
Total Bilirubin: 1.2 mg/dL (ref 0.3–1.2)
Total Protein: 6.2 g/dL — ABNORMAL LOW (ref 6.5–8.1)

## 2018-11-10 LAB — MAGNESIUM: Magnesium: 2.3 mg/dL (ref 1.7–2.4)

## 2018-11-10 LAB — FERRITIN: Ferritin: 2791 ng/mL — ABNORMAL HIGH (ref 24–336)

## 2018-11-10 LAB — D-DIMER, QUANTITATIVE: D-Dimer, Quant: 1.36 ug/mL-FEU — ABNORMAL HIGH (ref 0.00–0.50)

## 2018-11-10 NOTE — Progress Notes (Signed)
Suzeanne wife updated. Thoroughly discussed patient condition, medications, and plan of care.

## 2018-11-10 NOTE — TOC Initial Note (Addendum)
Transition of Care [facility]) - Initial/Assessment Note    Patient Details  Name: Manuel Friedman MRN: [id number] Date of Birth: 1940-04-22  Transition [facility]) CM/SW Contact:    Ninfa Meeker, RN Phone Number: [phone] (working remotely) 11/10/2018, 12:57 PM  Clinical Narrative:   78 yr old gentleman admitted and being treated for COVID 19. Case manager contacted patient's wife to discuss discharge plan and answer her questions concerning same. Mrs.Manuel Friedman requested that her husband have therapy at discharge, wants to be sure he is safe ambulating up and down stairs in home. She is prepared for him to remain upstairs for most of the time until he regains his strength. Case manager updated Dr. Sherral Hammers.  Also requesting patient be transported home via [place]. CM explained that we can arrange for that. Choice for Home Health agency offered, referral called to Adela Lank, [facility] Liaison. 3in1 will be delivered to patient's home by [facility].  Notify wife, [name] on her cell when patient ready for discharge: [phone].              Expected Discharge Plan: [place] Barriers to Discharge: Continued Medical Work up   Patient Goals and CMS Choice   CMS Medicare.gov Compare Post Acute Care list provided to:: Patient Represenative (must comment)(wife) Choice offered to / list presented to : Spouse  Expected Discharge Plan and Services Expected Discharge Plan: [place] In-house Referral: NA Discharge Planning Services: CM Consult Post Acute Care Choice: Durable Medical Equipment, Home Health Living arrangements for the past 2 months: [place]                 DME Arranged: 3-N-1 DME Agency: [place] Date DME Agency Contacted: 11/10/18 Time DME Agency Contacted: [id number] Representative spoke with at DME Agency: Learta Codding HH Arranged: PT [place] Agency: [place] Date [place]:  11/10/18 Time [place]: 1215 Representative spoke with at [place]: Adela Lank  Prior Living Arrangements/Services Living arrangements for the past 2 months: Single Family Home Lives with:: Spouse Patient language and need for interpreter reviewed:: No Do you feel safe going back to the place where you live?: Yes      Need for Family Participation in Patient Care: Yes (Comment) Care giver support system in place?: Yes (comment)   Criminal Activity/Legal Involvement Pertinent to Current Situation/Hospitalization: No - Comment as needed  Activities of Daily Living Home Assistive Devices/Equipment: None ADL Screening (condition at time of admission) Patient's cognitive ability adequate to safely complete daily activities?: Yes Is the patient deaf or have difficulty hearing?: Yes Does the patient have difficulty seeing, even when wearing glasses/contacts?: No Does the patient have difficulty concentrating, remembering, or making decisions?: No Patient able to express need for assistance with ADLs?: Yes Does the patient have difficulty dressing or bathing?: No Independently performs ADLs?: Yes (appropriate for developmental age) Does the patient have difficulty walking or climbing stairs?: No Weakness of Legs: Both Weakness of Arms/Hands: None  Permission Sought/Granted Permission sought to share information with : Case Manager Permission granted to share information with : Yes, Verbal Permission Granted     Permission granted to share info w AGENCY: [facility]        Emotional Assessment       Orientation: : Oriented to Self, Oriented to Place, Oriented to  Time, Oriented to Situation Alcohol / Substance Use: Not Applicable    Admission diagnosis:  COVID-19 virus infection [U07.1] Patient Active Problem List   Diagnosis Date Noted  . Elevated liver enzymes 11/09/2018  . COVID-19 virus infection 11/06/2018  . Generalized weakness 11/06/2018  . PAD  (peripheral artery disease) ([place]) 04/06/2017  . Rosacea   . Internal and external hemorrhoids without complication   . Hyperlipemia   . HTN (hypertension)   . Hiatal hernia   . Esophageal stricture   . Diverticulosis   . Depression   . Decreased hearing   . COPD (chronic obstructive pulmonary disease) ([place])   . Arthritis   . Adenomatous colon polyp   . Peripheral arterial disease ([place]) 02/03/2017  . Abdominal aortic aneurysm ([place]) 07/04/2014  . Personal history of colonic polyps 03/27/2012  . Cerebrovascular disease 09/26/2011  . CAD (coronary artery disease) 04/18/2011  . Lung nodule 04/18/2011  . Chest pain 03/07/2011  . Hypertension 03/07/2011  . Hyperlipidemia 03/07/2011   PCP:  [name], MD Pharmacy:   [facility], [place] [address] Phone: [phone] Fax: [phone]     Social Determinants of Health (SDOH) Interventions    Readmission Risk Interventions No flowsheet data found.

## 2018-11-10 NOTE — Progress Notes (Signed)
PROGRESS NOTE    Manuel Friedman  [id number] DOB: 03-Apr-1940 DOA: 11/06/2018 PCP: [name], MD   Brief Narrative:  78 y.o. WM PMHx HTN, CAD, skin cancer, arthritis, remote tobacco abuse, and depression;   Presents with complaints of fever and weakness.  He had gone out with his brother and sister-in-law a week and a half ago.  A few days later he stated that his sister-in-law had tested positive for corona virus.  At home he had developed fevers up to 101 F and went to his primary care provider on 8/10 requesting to be tested at that time.  The test came back positive.  Since that time he reports that he has been getting progressively weaker and weaker.  He has been still having intermittent fevers utilizing Tylenol at home.  Reports having decreased appetite due to a continued bad taste in his mouth.  Other associated symptoms include fall a few days ago, mildly productive cough, nausea, and vomiting.  Denies having any significant shortness of breath or pain symptoms recent fall.  He reports a 50 smoking pack year history of tobacco use, but reports quitting over 20 years ago.  Baseline patient reports that he does not require oxygen or need inhalers.  ED Course: On admission into the emergency department patient was noted to be afebrile, respiration rate 25, blood pressures 107/60, and O2 saturations reported to be maintained on 2 L nasal cannula oxygen.  O2 saturations reported to be as low as 88-89% on room air.  Labs significant for LDH 320, ferritin 1469, CRP 9.6, lactic acid 1.7, d-dimer 1.41, BNP 67.2, and fibrinogen 755.  Chest x-ray showed cardiomegaly with prominence concerning for edema versus atypical infection   Subjective: 8/22 A/O x4, negative CP, negative abdominal pain, negative N/V, negative S OB, negative jaundice   Assessment & Plan:   Principal Problem:   COVID-19 virus infection Active Problems:   Hypertension   Hyperlipidemia   Depression   Generalized  weakness   Elevated liver enzymes   Principal Problem:   COVID-19 virus infection Active Problems:   Hypertension   Hyperlipidemia   Depression   Generalized weakness  COVID 19 infection  - CXR consistent with coinfection however patient not hypoxic on RA - Procalcitonin 0.53 complete 5-day course antibiotics - Decadron 6 mg daily - Continue COVID vitamins per protocol - Combivent QID Recent Labs  Lab 11/06/18 0934 11/07/18 0230 11/08/18 0355 11/09/18 0423 11/10/18 0233  CRP 9.6* 11.1* 4.5* 2.1* 1.4*   Recent Labs  Lab 11/06/18 0934 11/07/18 0230 11/08/18 0355 11/09/18 0423 11/10/18 0233  DDIMER 1.41* 1.20* 1.10* 1.40* 1.36*    Generalized weakness -I discussed case with NCM Ricki Miller who is arranging for [facility], PT, OT when patient safe for discharge.    Essential HTN - Imdur 60 mg daily -Losartan 25 mg daily   HLD - Lipitor (hold) secondary to increased liver enzyme  - 8/21 lipid panel LDL= 45, feel that patient should not be discharged on statin given his elevated liver enzymes.  Depression  - Lexapro  Elevated liver enzyme - Acute hepatitis panel pending - Autoimmune hepatitis panel (antimicrosomal antibody liver/kidney) pending - Liver ultrasound pending Recent Labs  Lab 11/06/18 0900 11/07/18 0230 11/08/18 0355 11/09/18 0423 11/10/18 0233  AST 62* 78* 324* 326* 201*   Recent Labs  Lab 11/06/18 0900 11/07/18 0230 11/08/18 0355 11/09/18 0423 11/10/18 0233  ALT 52* 65* 310* 443* 362*     DVT prophylaxis: Lovenox Code Status:  Full Family Communication: 8/21 spoke with patient's wife at length concerning plan of care.  Answered all questions. Disposition Plan: Discharge next 24 to 48 hours   Consultants:  None   Procedures/Significant Events:     I have personally reviewed and interpreted all radiology studies and my findings are as above.  VENTILATOR SETTINGS:    Cultures 8/10 Novel coronavirus positive 8/18 blood  RIGHT arm NGTD 8/18 blood site unspecified NGTD 8/21 Autoimmune hepatitis panel (antimicrosomal antibody liver/kidney) pendingte hepatitis panel pending 8/21 acute hepatitis panel pending    Antimicrobials: Anti-infectives (From admission, onward)   Start     Stop   11/09/18 1000  azithromycin (ZITHROMAX) tablet 500 mg         11/08/18 1000  azithromycin (ZITHROMAX) 500 mg in sodium chloride 0.9 % 250 mL IVPB  Status:  Discontinued     11/08/18 1331   11/06/18 1145  azithromycin (ZITHROMAX) 500 mg in sodium chloride 0.9 % 250 mL IVPB  Status:  Discontinued     11/07/18 1551       Devices    LINES / TUBES:      Continuous Infusions:    Objective: Vitals:   11/10/18 0710 11/10/18 0720 11/10/18 0730 11/10/18 0740  BP:    140/81  Pulse: 70 62 70 65  Resp: (!) 26 (!) 28 15 (!) 24  Temp:    (!) 97.5 F (36.4 C)  TempSrc:    Oral  SpO2: 92% 94% 94% 90%  Weight:      Height:        Intake/Output Summary (Last 24 hours) at 11/10/2018 0844 Last data filed at 11/10/2018 0700 Gross per 24 hour  Intake 120 ml  Output 1250 ml  Net -1130 ml   Filed Weights   11/06/18 0857  Weight: 93 kg   Physical Exam:  General: A/O x4, no acute respiratory distress Eyes: negative scleral hemorrhage, negative anisocoria, negative icterus ENT: Negative Runny nose, negative gingival bleeding, Neck:  Negative scars, masses, torticollis, lymphadenopathy, JVD Lungs: Clear to auscultation bilaterally without wheezes or crackles Cardiovascular: Regular rate and rhythm without murmur gallop or rub normal S1 and S2 Abdomen: negative abdominal pain, nondistended, positive soft, bowel sounds, no rebound, no ascites, no appreciable mass Extremities: No significant cyanosis, clubbing, or edema bilateral lower extremities Skin: Negative rashes, lesions, ulcers, negative jaundice Psychiatric:  Negative depression, negative anxiety, negative fatigue, negative mania  Central nervous system:   Cranial nerves II through XII intact, tongue/uvula midline, all extremities muscle strength 5/5, sensation intact throughout, negative dysarthria, negative expressive aphasia, negative receptive aphasia.  .     Data Reviewed: Care during the described time interval was provided by me .  I have reviewed this patient's available data, including medical history, events of note, physical examination, and all test results as part of my evaluation.   CBC: Recent Labs  Lab 11/06/18 0900 11/07/18 0230 11/08/18 0355 11/09/18 0423 11/10/18 0233  WBC 7.4 6.3 8.7 7.6 6.8  NEUTROABS 6.6 5.5 7.5 6.4 5.1  HGB 13.3 13.3 12.4* 12.0* 12.4*  HCT 38.3* 39.1 36.0* 35.1* 36.0*  MCV 89.9 92.2 91.4 90.7 90.9  PLT 201 230 259 277 0000000   Basic Metabolic Panel: Recent Labs  Lab 11/06/18 0900 11/07/18 0230 11/08/18 0355 11/09/18 0423 11/10/18 0233  NA 134* 136 135 137 136  K 3.5 3.8 3.9 4.3 4.4  CL 104 104 103 107 104  CO2 19* 24 21* 22 24  GLUCOSE 144* 169* 165* 166*  168*  BUN 20 20 26* 26* 25*  CREATININE 1.12 0.85 0.90 0.82 0.74  CALCIUM 8.2* 8.2* 8.2* 8.4* 8.4*  MG  --  2.4  --  2.3 2.3  PHOS  --  2.5  --   --   --    GFR: Estimated Creatinine Clearance: 82.2 mL/min (by C-G formula based on SCr of 0.74 mg/dL). Liver Function Tests: Recent Labs  Lab 11/06/18 0900 11/07/18 0230 11/08/18 0355 11/09/18 0423 11/10/18 0233  AST 62* 78* 324* 326* 201*  ALT 52* 65* 310* 443* 362*  ALKPHOS 56 61 66 68 69  BILITOT 1.0 0.6 0.6 0.8 1.2  PROT 6.5 6.9 6.2* 6.0* 6.2*  ALBUMIN 2.9* 2.9* 2.7* 2.6* 2.6*   No results for input(s): LIPASE, AMYLASE in the last 168 hours. No results for input(s): AMMONIA in the last 168 hours. Coagulation Profile: Recent Labs  Lab 11/06/18 0900  INR 1.0   Cardiac Enzymes: Recent Labs  Lab 11/07/18 0230  CKTOTAL 103   BNP (last 3 results) No results for input(s): PROBNP in the last 8760 hours. HbA1C: No results for input(s): HGBA1C in the last 72 hours.  CBG: No results for input(s): GLUCAP in the last 168 hours. Lipid Profile: Recent Labs    11/09/18 0423  CHOL 78  HDL 12*  LDLCALC 45  TRIG 107  CHOLHDL 6.5   Thyroid Function Tests: No results for input(s): TSH, T4TOTAL, FREET4, T3FREE, THYROIDAB in the last 72 hours. Anemia Panel: Recent Labs    11/09/18 0423 11/10/18 0233  FERRITIN 4,998* 2,791*   Urine analysis:    Component Value Date/Time   COLORURINE AMBER (A) 11/06/2018 0910   APPEARANCEUR HAZY (A) 11/06/2018 0910   LABSPEC 1.035 (H) 11/06/2018 0910   PHURINE 5.0 11/06/2018 0910   GLUCOSEU NEGATIVE 11/06/2018 0910   HGBUR NEGATIVE 11/06/2018 0910   BILIRUBINUR NEGATIVE 11/06/2018 0910   KETONESUR NEGATIVE 11/06/2018 0910   PROTEINUR 100 (A) 11/06/2018 0910   NITRITE NEGATIVE 11/06/2018 0910   LEUKOCYTESUR NEGATIVE 11/06/2018 0910   Sepsis Labs: @LABRCNTIP (procalcitonin:4,lacticidven:4)  ) Recent Results (from the past 240 hour(s))  Culture, blood (Routine x 2)     Status: None (Preliminary result)   Collection Time: 11/06/18  9:36 AM   Specimen: BLOOD  Result Value Ref Range Status   Specimen Description BLOOD SITE NOT SPECIFIED  Final   Special Requests   Final    BOTTLES DRAWN AEROBIC AND ANAEROBIC Blood Culture adequate volume   Culture   Final    NO GROWTH 4 DAYS Performed at [facility], [place] [address] [place], [place] [address]    Report Status PENDING  Incomplete  Culture, blood (Routine x 2)     Status: None (Preliminary result)   Collection Time: 11/06/18  9:50 AM   Specimen: BLOOD RIGHT ARM  Result Value Ref Range Status   Specimen Description BLOOD RIGHT ARM  Final   Special Requests   Final    BOTTLES DRAWN AEROBIC AND ANAEROBIC Blood Culture adequate volume   Culture   Final    NO GROWTH 4 DAYS Performed at [facility], [place] [address], [place], [place] [address]    Report Status PENDING  Incomplete         Radiology Studies: No results found.       Scheduled Meds: . azithromycin  500 mg Oral Daily  . dexamethasone (DECADRON) injection  6 mg Intravenous Q24H  . enoxaparin (LOVENOX) injection  40 mg Subcutaneous  Q24H  . escitalopram  10 mg Oral Daily  . famotidine  20 mg Oral BID  . Ipratropium-Albuterol  1 puff Inhalation QID  . isosorbide mononitrate  60 mg Oral Daily  . losartan  25 mg Oral Daily  . sodium chloride flush  3 mL Intravenous Q12H  . vitamin C  500 mg Oral Daily  . zinc sulfate  220 mg Oral Daily  . zolpidem  5 mg Oral QHS   Continuous Infusions:    LOS: 4 days   The patient is critically ill with multiple organ systems failure and requires high complexity decision making for assessment and support, frequent evaluation and titration of therapies, application of advanced monitoring technologies and extensive interpretation of multiple databases. Critical Care Time devoted to patient care services described in this note  Time spent: 40 minutes    Austyn Seier, Geraldo Docker, MD [facility] Pager [phone]  If 7PM-7AM, please contact night-coverage [email or web address] Password [facility] 11/10/2018, 8:44 AM

## 2018-11-11 LAB — CULTURE, BLOOD (ROUTINE X 2)
Culture: NO GROWTH
Culture: NO GROWTH
Special Requests: ADEQUATE
Special Requests: ADEQUATE

## 2018-11-11 LAB — CBC WITH DIFFERENTIAL/PLATELET
Abs Immature Granulocytes: 0.05 10*3/uL (ref 0.00–0.07)
Basophils Absolute: 0 10*3/uL (ref 0.0–0.1)
Basophils Relative: 0 %
Eosinophils Absolute: 0 10*3/uL (ref 0.0–0.5)
Eosinophils Relative: 0 %
HCT: 36.6 % — ABNORMAL LOW (ref 39.0–52.0)
Hemoglobin: 12.8 g/dL — ABNORMAL LOW (ref 13.0–17.0)
Immature Granulocytes: 1 %
Lymphocytes Relative: 19 %
Lymphs Abs: 1.3 10*3/uL (ref 0.7–4.0)
MCH: 31.4 pg (ref 26.0–34.0)
MCHC: 35 g/dL (ref 30.0–36.0)
MCV: 89.9 fL (ref 80.0–100.0)
Monocytes Absolute: 0.5 10*3/uL (ref 0.1–1.0)
Monocytes Relative: 8 %
Neutro Abs: 4.7 10*3/uL (ref 1.7–7.7)
Neutrophils Relative %: 72 %
Platelets: 332 10*3/uL (ref 150–400)
RBC: 4.07 MIL/uL — ABNORMAL LOW (ref 4.22–5.81)
RDW: 13 % (ref 11.5–15.5)
WBC: 6.6 10*3/uL (ref 4.0–10.5)
nRBC: 0 % (ref 0.0–0.2)

## 2018-11-11 LAB — C-REACTIVE PROTEIN: CRP: 1.1 mg/dL — ABNORMAL HIGH (ref <1.0)

## 2018-11-11 LAB — COMPREHENSIVE METABOLIC PANEL
ALT: 286 U/L — ABNORMAL HIGH (ref 0–44)
AST: 125 U/L — ABNORMAL HIGH (ref 15–41)
Albumin: 2.7 g/dL — ABNORMAL LOW (ref 3.5–5.0)
Alkaline Phosphatase: 66 U/L (ref 38–126)
Anion gap: 10 (ref 5–15)
BUN: 27 mg/dL — ABNORMAL HIGH (ref 8–23)
CO2: 21 mmol/L — ABNORMAL LOW (ref 22–32)
Calcium: 8.6 mg/dL — ABNORMAL LOW (ref 8.9–10.3)
Chloride: 105 mmol/L (ref 98–111)
Creatinine, Ser: 0.8 mg/dL (ref 0.61–1.24)
GFR calc Af Amer: >60 mL/min (ref >60)
GFR calc non Af Amer: >60 mL/min (ref >60)
Glucose, Bld: 119 mg/dL — ABNORMAL HIGH (ref 70–99)
Potassium: 4 mmol/L (ref 3.5–5.1)
Sodium: 136 mmol/L (ref 135–145)
Total Bilirubin: 0.9 mg/dL (ref 0.3–1.2)
Total Protein: 6.2 g/dL — ABNORMAL LOW (ref 6.5–8.1)

## 2018-11-11 LAB — MAGNESIUM: Magnesium: 2.2 mg/dL (ref 1.7–2.4)

## 2018-11-11 LAB — HEPATITIS PANEL, ACUTE
HCV Ab: 0.1 s/co ratio (ref 0.0–0.9)
Hep A IgM: NEGATIVE
Hep B C IgM: NEGATIVE
Hepatitis B Surface Ag: NEGATIVE

## 2018-11-11 LAB — D-DIMER, QUANTITATIVE: D-Dimer, Quant: 1.71 ug/mL-FEU — ABNORMAL HIGH (ref 0.00–0.50)

## 2018-11-11 LAB — FERRITIN: Ferritin: 1834 ng/mL — ABNORMAL HIGH (ref 24–336)

## 2018-11-11 MED ORDER — AZITHROMYCIN 250 MG PO TABS: ORAL_TABLET | ORAL | 0 refills | Status: DC

## 2018-11-11 MED ORDER — ASCORBIC ACID 500 MG PO TABS: 500.0000 mg | ORAL_TABLET | Freq: Every day | ORAL | 0 refills | Status: DC

## 2018-11-11 MED ORDER — FAMOTIDINE 20 MG PO TABS: 20.0000 mg | ORAL_TABLET | Freq: Two times a day (BID) | ORAL | 0 refills | Status: DC

## 2018-11-11 MED ORDER — ZINC SULFATE 220 (50 ZN) MG PO CAPS: 220.0000 mg | ORAL_CAPSULE | Freq: Every day | ORAL | 0 refills | Status: DC

## 2018-11-11 MED ORDER — STIOLTO RESPIMAT 2.5-2.5 MCG/ACT IN AERS: 1.0000 | INHALATION_SPRAY | Freq: Two times a day (BID) | RESPIRATORY_TRACT | 0 refills | Status: DC | PRN

## 2018-11-11 MED ORDER — ESCITALOPRAM OXALATE 10 MG PO TABS: 10.0000 mg | ORAL_TABLET | Freq: Every day | ORAL | 0 refills | Status: DC

## 2018-11-11 NOTE — Progress Notes (Signed)
Patients 02 sats at rest were 99% on room air Able to maintain sats >91% while ambulating on room air Recovered to 96% with rest Does not require home 02

## 2018-11-11 NOTE — Discharge Summary (Signed)
Physician Discharge Summary  Manuel Friedman YIA:[id number] DOB: January 22, 1941 DOA: 11/06/2018  PCP: [name], MD  Admit date: 11/06/2018 Discharge date: 11/11/2018  Time spent: 30 minutes  Recommendations for Outpatient Follow-up:   COVID 19 infection  - CXR consistent with coinfection however patient not hypoxic on RA - Procalcitonin 0.53 complete 5-day course antibiotics - Decadron 6 mg daily.  Discontinued upon discharge - Continue COVID vitamins per protocol - Combivent QID Recent Labs  Lab 11/07/18 0230 11/08/18 0355 11/09/18 0423 11/10/18 0233 11/11/18 0405  CRP 11.1* 4.5* 2.1* 1.4* 1.1*   Recent Labs  Lab 11/07/18 0230 11/08/18 0355 11/09/18 0423 11/10/18 0233 11/11/18 0405  DDIMER 1.20* 1.10* 1.40* 1.36* 1.71*    Generalized weakness -I discussed case with NCM Ricki Miller who is arranging for [facility], PT, OT when patient safe for discharge.    Essential HTN - Imdur 60 mg daily -Losartan 25 mg daily   HLD - Lipitor (hold) secondary to increased liver enzyme  - 8/21 lipid panel LDL= 45, feel that patient should not be discharged on statin given his elevated liver enzymes. -Patient instructed not to resume Lipitor until cleared by his PCP.  Schedule appointment with Dr. [name], in 1 to 2 weeks check liver enzymes to ensure they are continuing to normalize.  Depression  -  Lexapro 10 mg daily   Elevated liver enzyme - Acute hepatitis panel negative - Autoimmune hepatitis panel (antimicrosomal antibody liver/kidney) pending - Liver ultrasound pending Recent Labs  Lab 11/07/18 0230 11/08/18 0355 11/09/18 0423 11/10/18 0233 11/11/18 0405  AST 78* 324* 326* 201* 125*   Recent Labs  Lab 11/07/18 0230 11/08/18 0355 11/09/18 0423 11/10/18 0233 11/11/18 0405  ALT 65* 310* 443* 362* 286*  Patient consult liver enzymes trending down but not back to normal.  PCP will need to follow closely    Discharge Diagnoses:  Principal Problem:    COVID-19 virus infection Active Problems:   Hypertension   Hyperlipidemia   Depression   Generalized weakness   Elevated liver enzymes   Discharge Condition: Stable  Diet recommendation: Heart healthy  Filed Weights   11/06/18 0857  Weight: 93 kg    History of present illness:  78 y.o.WM PMHx HTN, CAD, skin cancer, arthritis,remote tobacco abuse,anddepression;   Presents with complaints of fever and weakness. He had gone out with his brother and sister-in-law a week and a half ago. A few days later he stated that his sister-in-law had tested positive for corona virus. At home he had developed fevers up to 101 F and went to his primary care provider on 8/10 requesting to be tested at that time. The test came back positive. Since that time he reports that he has been getting progressively weaker and weaker. He has been still having intermittent fevers utilizing Tylenol at home. Reports having decreased appetite due to a continued bad taste in his mouth. Other associated symptoms include fall a few days ago, mildly productive cough, nausea, and vomiting. Denies having any significant shortness of breath or pain symptoms recent fall. He reports a 50 smoking pack year history of tobacco use, but reports quitting over 20 years ago. Baseline patient reports that he does not require oxygen or need inhalers.  ED Course:On admission into the emergency department patient was noted to be afebrile, respiration rate 25, blood pressures 107/60, and O2 saturations reported to be maintained on 2 L nasal cannula oxygen. O2 saturations reported to be as low as 88-89% on room air.  Labs significant for LDH 320, ferritin 1469, CRP 9.6, lactic acid 1.7, d-dimer 1.41, BNP 67.2, and fibrinogen 755. Chest x-ray showed cardiomegaly with prominence concerning for edema versus atypical infection During his hospitalization patient treated for COVID infection, with high-dose steroids.  Patient never  met criteria for use of Remdesivir or Actemra   Procedures: 8/22 ultrasound abdomen RUQ;-negative sonographic findings to explain the patient's elevated LFT    Cultures   8/10 Novel coronavirus positive 8/18 blood negative final 8/18 blood RIGHT arm negative plan 8/22 Acute hepatitis panel negative 8/22 Autoimmune hepatitis panel (antimicrosomal antibody liver/kidney) pendingte hepatitis panel pending    Antibiotics Anti-infectives (From admission, onward)   Start     Stop   11/09/18 1000  azithromycin (ZITHROMAX) tablet 500 mg     11/13/18 2359   11/08/18 1000  azithromycin (ZITHROMAX) 500 mg in sodium chloride 0.9 % 250 mL IVPB  Status:  Discontinued     11/08/18 1331   11/06/18 1145  azithromycin (ZITHROMAX) 500 mg in sodium chloride 0.9 % 250 mL IVPB  Status:  Discontinued     11/07/18 1551       Discharge Exam: Vitals:   11/10/18 1625 11/10/18 2003 11/11/18 0417 11/11/18 0800  BP: 98/64 100/88 135/71 110/69  Pulse: 82 65 63 76  Resp: 20 19  (!) 23  Temp:  (!) 97.5 F (36.4 C) 97.6 F (36.4 C) 98.3 F (36.8 C)  TempSrc:  Oral Oral Oral  SpO2: 91% 90% 94%   Weight:      Height:        General: A/O x4, no acute respiratory distress Eyes: negative scleral hemorrhage, negative anisocoria, negative icterus ENT: Negative Runny nose, negative gingival bleeding, Neck:  Negative scars, masses, torticollis, lymphadenopathy, JVD Lungs: Clear to auscultation bilaterally without wheezes or crackles Cardiovascular: Regular rate and rhythm without murmur gallop or rub normal S1 and S2    Discharge Instructions   Allergies as of 11/11/2018   No Known Allergies     Medication List    STOP taking these medications   acetaminophen 500 MG tablet Commonly known as: TYLENOL   atorvastatin 80 MG tablet Commonly known as: LIPITOR     TAKE these medications   ascorbic acid 500 MG tablet Commonly known as: VITAMIN C Take 1 tablet (500 mg total) by mouth daily.    azithromycin 250 MG tablet Commonly known as: ZITHROMAX 2 tablets p.o. daily   cetirizine 10 MG tablet Commonly known as: ZYRTEC Take 10 mg by mouth as needed for allergies.   COQ-10 PO Take 1 tablet by mouth daily.   escitalopram 10 MG tablet Commonly known as: LEXAPRO Take 1 tablet (10 mg total) by mouth daily. What changed:   medication strength  how much to take   famotidine 20 MG tablet Commonly known as: PEPCID Take 1 tablet (20 mg total) by mouth 2 (two) times daily.   isosorbide mononitrate 60 MG 24 hr tablet Commonly known as: IMDUR TAKE 1 TABLET EVERY DAY   losartan 50 MG tablet Commonly known as: COZAAR Take 25 mg by mouth daily.   magnesium oxide 400 MG tablet Commonly known as: MAG-OX Take 400 mg by mouth daily.   multivitamin with minerals Tabs tablet Take 1 tablet by mouth daily.   naproxen sodium 220 MG tablet Commonly known as: ALEVE Take 220 mg by mouth daily as needed (pain).   Stiolto Respimat 2.5-2.5 MCG/ACT Aers Generic drug: Tiotropium Bromide-Olodaterol Inhale 1 puff into the lungs 2 (  two) times daily as needed (SOB).   vitamin B-12 250 MCG tablet Commonly known as: CYANOCOBALAMIN Take 250 mcg by mouth daily.   zinc sulfate 220 (50 Zn) MG capsule Take 1 capsule (220 mg total) by mouth daily.   zolpidem 5 MG tablet Commonly known as: AMBIEN Take 5 mg by mouth at bedtime.            Durable Medical Equipment  (From admission, onward)         Start     Ordered   11/10/18 1146  For home use only DME 3 n 1  Once     11/10/18 1146         No Known Allergies    The results of significant diagnostics from this hospitalization (including imaging, microbiology, ancillary and laboratory) are listed below for reference.    Significant Diagnostic Studies: Dg Chest Portable 1 View  Result Date: 11/06/2018 CLINICAL DATA:  Shortness of breath EXAM: PORTABLE CHEST 1 VIEW COMPARISON:  March 22, 2017 FINDINGS: There is mild  cardiomegaly with pulmonary vascularity within normal limits. There is diffuse interstitial prominence, likely edema. No consolidation. No pleural effusion. No adenopathy. No bone lesions. IMPRESSION: Mild cardiomegaly with diffuse interstitial prominence, likely interstitial edema. Suspect a degree of congestive heart failure. Atypical organism pneumonia throughout the lungs bilaterally could present in this manner and is a differential consideration. No evident adenopathy. Electronically Signed   By: Lowella Grip III M.D.   On: 11/06/2018 09:24   US Abdomen Limited Ruq  Result Date: 11/10/2018 CLINICAL DATA:  Elevated LFTs. EXAM: ULTRASOUND ABDOMEN LIMITED RIGHT UPPER QUADRANT COMPARISON:  None. FINDINGS: Gallbladder: Gallbladder incompletely distended with upper normal gallbladder wall thickness. No evidence for gallstones. No pericholecystic fluid. Common bile duct: Diameter: 4 mm Liver: No focal lesion identified. Within normal limits in parenchymal echogenicity. Portal vein is patent on color Doppler imaging with normal direction of blood flow towards the liver. Other: None. IMPRESSION: No sonographic findings to explain the patient's elevated LFTs. Electronically Signed   By: Misty Stanley M.D.   On: 11/10/2018 13:11    Microbiology: Recent Results (from the past 240 hour(s))  Culture, blood (Routine x 2)     Status: None   Collection Time: 11/06/18  9:36 AM   Specimen: BLOOD  Result Value Ref Range Status   Specimen Description BLOOD SITE NOT SPECIFIED  Final   Special Requests   Final    BOTTLES DRAWN AEROBIC AND ANAEROBIC Blood Culture adequate volume   Culture   Final    NO GROWTH 5 DAYS Performed at [facility], [address] [place], [place] [address]    Report Status 11/11/2018 FINAL  Final  Culture, blood (Routine x 2)     Status: None   Collection Time: 11/06/18  9:50 AM   Specimen: BLOOD RIGHT ARM  Result Value Ref Range Status   Specimen Description BLOOD  RIGHT ARM  Final   Special Requests   Final    BOTTLES DRAWN AEROBIC AND ANAEROBIC Blood Culture adequate volume   Culture   Final    NO GROWTH 5 DAYS Performed at [facility], [place] [address], [place], [place] [address]    Report Status 11/11/2018 FINAL  Final     Labs: Basic Metabolic Panel: Recent Labs  Lab 11/07/18 0230 11/08/18 0355 11/09/18 0423 11/10/18 0233 11/11/18 0405  NA 136 135 137 136 136  K 3.8 3.9 4.3 4.4 4.0  CL 104 103  107 104 105  CO2 24 21* 22 24 21*  GLUCOSE 169* 165* 166* 168* 119*  BUN 20 26* 26* 25* 27*  CREATININE 0.85 0.90 0.82 0.74 0.80  CALCIUM 8.2* 8.2* 8.4* 8.4* 8.6*  MG 2.4  --  2.3 2.3 2.2  PHOS 2.5  --   --   --   --    Liver Function Tests: Recent Labs  Lab 11/07/18 0230 11/08/18 0355 11/09/18 0423 11/10/18 0233 11/11/18 0405  AST 78* 324* 326* 201* 125*  ALT 65* 310* 443* 362* 286*  ALKPHOS 61 66 68 69 66  BILITOT 0.6 0.6 0.8 1.2 0.9  PROT 6.9 6.2* 6.0* 6.2* 6.2*  ALBUMIN 2.9* 2.7* 2.6* 2.6* 2.7*   No results for input(s): LIPASE, AMYLASE in the last 168 hours. No results for input(s): AMMONIA in the last 168 hours. CBC: Recent Labs  Lab 11/07/18 0230 11/08/18 0355 11/09/18 0423 11/10/18 0233 11/11/18 0405  WBC 6.3 8.7 7.6 6.8 6.6  NEUTROABS 5.5 7.5 6.4 5.1 4.7  HGB 13.3 12.4* 12.0* 12.4* 12.8*  HCT 39.1 36.0* 35.1* 36.0* 36.6*  MCV 92.2 91.4 90.7 90.9 89.9  PLT 230 259 277 296 332   Cardiac Enzymes: Recent Labs  Lab 11/07/18 0230  CKTOTAL 103   BNP: BNP (last 3 results) Recent Labs    11/06/18 0955  BNP 67.2    ProBNP (last 3 results) No results for input(s): PROBNP in the last 8760 hours.  CBG: No results for input(s): GLUCAP in the last 168 hours.     Signed:  Dia Crawford, MD [facility] [phone] pager

## 2018-11-11 NOTE — TOC Transition Note (Signed)
Transition of Care [facility]) - CM/SW Discharge Note Marvetta Gibbons RN,BSN [facility] [place] Case Manager [phone]   Patient Details  Name: Manuel Friedman MRN: [id number] Date of Birth: 07-15-1940  [facility] CM/SW Contact:  Dawayne Patricia, RN Phone Number: 11/11/2018, 11:24 AM   Clinical Narrative:    Pt stable for transition home today, per previous CM note [place] referral has been made to [facility]- orders placed for HHPT/OT/aide- notified Tommi Rumps with [facility] of transition home today for start of care. Call made to wife [name] to notify of discharge- and confirm address for PTAR. Wife requesting to speak to MD- phone # provided to nursing unit. [facility] called to schedule transport per bedside RN request for 12 noon or after pickup. Paperwork printed to west side nursing unit and confirmed with Financial controller that paperwork is there for transport.    Final next level of care: Home w Home Health Services Barriers to Discharge: Barriers Resolved, No Barriers Identified   Patient Goals and CMS Choice   CMS Medicare.gov Compare Post Acute Care list provided to:: Patient Represenative (must comment)(wife) Choice offered to / list presented to : Spouse  Discharge Placement  Home with [facility]                     Discharge Plan and Services In-house Referral: NA Discharge Planning Services: CM Consult Post Acute Care Choice: Durable Medical Equipment, Home Health          DME Arranged: 3-N-1 DME Agency: [place] Date DME Agency Contacted: 11/10/18 Time DME Agency Contacted: 22 Representative spoke with at DME Agency: Learta Codding HH Arranged: PT, OT, Nurse's Aide [place] Agency: [place] Date [place]: 11/11/18 Time [place]: [id number] Representative spoke with at [place]: [place] ([place]) Interventions     Readmission Risk Interventions Readmission Risk  Prevention Plan 11/11/2018  Post Dischage Appt Complete  Medication Screening Complete  Transportation Screening Complete  Some recent data might be hidden

## 2018-11-11 NOTE — Progress Notes (Signed)
Patient discharged to home, AVS reviewed with patient, called wife and answered all questions, IV removed and patient transferred with PTAR. V/s stable

## 2018-11-12 DIAGNOSIS — U071 COVID-19: Secondary | ICD-10-CM | POA: Diagnosis not present

## 2018-11-12 LAB — ANTI-MICROSOMAL ANTIBODY LIVER / KIDNEY
LKM1 Ab: 0.9 Units (ref 0.0–20.0)
LKM1 Ab: 0.9 Units (ref 0.0–20.0)

## 2018-11-13 DIAGNOSIS — E785 Hyperlipidemia, unspecified: Secondary | ICD-10-CM | POA: Diagnosis not present

## 2018-11-13 DIAGNOSIS — M479 Spondylosis, unspecified: Secondary | ICD-10-CM | POA: Diagnosis not present

## 2018-11-13 DIAGNOSIS — F329 Major depressive disorder, single episode, unspecified: Secondary | ICD-10-CM | POA: Diagnosis not present

## 2018-11-13 DIAGNOSIS — Z85828 Personal history of other malignant neoplasm of skin: Secondary | ICD-10-CM | POA: Diagnosis not present

## 2018-11-13 DIAGNOSIS — Z87891 Personal history of nicotine dependence: Secondary | ICD-10-CM | POA: Diagnosis not present

## 2018-11-13 DIAGNOSIS — I2510 Atherosclerotic heart disease of native coronary artery without angina pectoris: Secondary | ICD-10-CM | POA: Diagnosis not present

## 2018-11-13 DIAGNOSIS — I119 Hypertensive heart disease without heart failure: Secondary | ICD-10-CM | POA: Diagnosis not present

## 2018-11-13 DIAGNOSIS — R945 Abnormal results of liver function studies: Secondary | ICD-10-CM | POA: Diagnosis not present

## 2018-11-13 DIAGNOSIS — U071 COVID-19: Secondary | ICD-10-CM | POA: Diagnosis not present

## 2018-11-14 LAB — HEPATITIS PANEL, ACUTE
HCV Ab: <0.1 s/co ratio (ref 0.0–0.9)
Hep A IgM: NEGATIVE
Hep B C IgM: NEGATIVE
Hepatitis B Surface Ag: NEGATIVE

## 2018-11-16 DIAGNOSIS — I2510 Atherosclerotic heart disease of native coronary artery without angina pectoris: Secondary | ICD-10-CM | POA: Diagnosis not present

## 2018-11-16 DIAGNOSIS — E785 Hyperlipidemia, unspecified: Secondary | ICD-10-CM | POA: Diagnosis not present

## 2018-11-16 DIAGNOSIS — Z87891 Personal history of nicotine dependence: Secondary | ICD-10-CM | POA: Diagnosis not present

## 2018-11-16 DIAGNOSIS — R945 Abnormal results of liver function studies: Secondary | ICD-10-CM | POA: Diagnosis not present

## 2018-11-16 DIAGNOSIS — U071 COVID-19: Secondary | ICD-10-CM | POA: Diagnosis not present

## 2018-11-16 DIAGNOSIS — I119 Hypertensive heart disease without heart failure: Secondary | ICD-10-CM | POA: Diagnosis not present

## 2018-11-16 DIAGNOSIS — F329 Major depressive disorder, single episode, unspecified: Secondary | ICD-10-CM | POA: Diagnosis not present

## 2018-11-16 DIAGNOSIS — M479 Spondylosis, unspecified: Secondary | ICD-10-CM | POA: Diagnosis not present

## 2018-11-16 DIAGNOSIS — Z85828 Personal history of other malignant neoplasm of skin: Secondary | ICD-10-CM | POA: Diagnosis not present

## 2018-11-19 DIAGNOSIS — U071 COVID-19: Secondary | ICD-10-CM | POA: Diagnosis not present

## 2018-11-19 DIAGNOSIS — M479 Spondylosis, unspecified: Secondary | ICD-10-CM | POA: Diagnosis not present

## 2018-11-19 DIAGNOSIS — Z87891 Personal history of nicotine dependence: Secondary | ICD-10-CM | POA: Diagnosis not present

## 2018-11-19 DIAGNOSIS — Z85828 Personal history of other malignant neoplasm of skin: Secondary | ICD-10-CM | POA: Diagnosis not present

## 2018-11-19 DIAGNOSIS — R945 Abnormal results of liver function studies: Secondary | ICD-10-CM | POA: Diagnosis not present

## 2018-11-19 DIAGNOSIS — E785 Hyperlipidemia, unspecified: Secondary | ICD-10-CM | POA: Diagnosis not present

## 2018-11-19 DIAGNOSIS — I2510 Atherosclerotic heart disease of native coronary artery without angina pectoris: Secondary | ICD-10-CM | POA: Diagnosis not present

## 2018-11-19 DIAGNOSIS — F329 Major depressive disorder, single episode, unspecified: Secondary | ICD-10-CM | POA: Diagnosis not present

## 2018-11-19 DIAGNOSIS — I119 Hypertensive heart disease without heart failure: Secondary | ICD-10-CM | POA: Diagnosis not present

## 2018-11-21 ENCOUNTER — Emergency Department (HOSPITAL_COMMUNITY)
Admission: EM | Admit: 2018-11-21 | Discharge: 2018-11-21 | Disposition: A | Discharge status: Home / Self Care | Payer: Medicare HMO | Attending: Emergency Medicine | Admitting: Emergency Medicine

## 2018-11-21 ENCOUNTER — Encounter (HOSPITAL_COMMUNITY): Payer: Self-pay

## 2018-11-21 ENCOUNTER — Emergency Department (HOSPITAL_COMMUNITY): Payer: Medicare HMO

## 2018-11-21 ENCOUNTER — Other Ambulatory Visit: Payer: Self-pay

## 2018-11-21 DIAGNOSIS — U071 COVID-19: Secondary | ICD-10-CM | POA: Diagnosis not present

## 2018-11-21 DIAGNOSIS — R945 Abnormal results of liver function studies: Secondary | ICD-10-CM | POA: Diagnosis not present

## 2018-11-21 DIAGNOSIS — S20212A Contusion of left front wall of thorax, initial encounter: Secondary | ICD-10-CM | POA: Diagnosis not present

## 2018-11-21 DIAGNOSIS — Z79899 Other long term (current) drug therapy: Secondary | ICD-10-CM | POA: Insufficient documentation

## 2018-11-21 DIAGNOSIS — I119 Hypertensive heart disease without heart failure: Secondary | ICD-10-CM | POA: Diagnosis not present

## 2018-11-21 DIAGNOSIS — Y999 Unspecified external cause status: Secondary | ICD-10-CM | POA: Insufficient documentation

## 2018-11-21 DIAGNOSIS — F329 Major depressive disorder, single episode, unspecified: Secondary | ICD-10-CM | POA: Diagnosis not present

## 2018-11-21 DIAGNOSIS — I10 Essential (primary) hypertension: Secondary | ICD-10-CM | POA: Diagnosis not present

## 2018-11-21 DIAGNOSIS — R0781 Pleurodynia: Secondary | ICD-10-CM | POA: Diagnosis not present

## 2018-11-21 DIAGNOSIS — J449 Chronic obstructive pulmonary disease, unspecified: Secondary | ICD-10-CM | POA: Insufficient documentation

## 2018-11-21 DIAGNOSIS — Y929 Unspecified place or not applicable: Secondary | ICD-10-CM | POA: Insufficient documentation

## 2018-11-21 DIAGNOSIS — Z85828 Personal history of other malignant neoplasm of skin: Secondary | ICD-10-CM | POA: Diagnosis not present

## 2018-11-21 DIAGNOSIS — Z87891 Personal history of nicotine dependence: Secondary | ICD-10-CM | POA: Diagnosis not present

## 2018-11-21 DIAGNOSIS — Y939 Activity, unspecified: Secondary | ICD-10-CM | POA: Diagnosis not present

## 2018-11-21 DIAGNOSIS — I2510 Atherosclerotic heart disease of native coronary artery without angina pectoris: Secondary | ICD-10-CM | POA: Insufficient documentation

## 2018-11-21 DIAGNOSIS — W0110XA Fall on same level from slipping, tripping and stumbling with subsequent striking against unspecified object, initial encounter: Secondary | ICD-10-CM | POA: Diagnosis not present

## 2018-11-21 DIAGNOSIS — S299XXA Unspecified injury of thorax, initial encounter: Secondary | ICD-10-CM | POA: Diagnosis not present

## 2018-11-21 DIAGNOSIS — E785 Hyperlipidemia, unspecified: Secondary | ICD-10-CM | POA: Diagnosis not present

## 2018-11-21 DIAGNOSIS — M479 Spondylosis, unspecified: Secondary | ICD-10-CM | POA: Diagnosis not present

## 2018-11-21 MED ORDER — OXYCODONE-ACETAMINOPHEN 5-325 MG PO TABS: 1.0000 | ORAL_TABLET | Freq: Four times a day (QID) | ORAL | 0 refills | Status: DC | PRN

## 2018-11-21 MED ORDER — DOCUSATE SODIUM 100 MG PO CAPS: 100.0000 mg | ORAL_CAPSULE | Freq: Two times a day (BID) | ORAL | 0 refills | Status: DC

## 2018-11-21 MED ORDER — OXYCODONE-ACETAMINOPHEN 5-325 MG PO TABS
1.0000 | ORAL_TABLET | Freq: Once | ORAL | Status: AC
  Administered 2018-11-21: 1 via ORAL
  Filled 2018-11-21: qty 1

## 2018-11-21 NOTE — ED Provider Notes (Signed)
[facility] DEPT Provider Note   CSN: [id number] Arrival date & time: 11/21/18  1451     History   Chief Complaint Chief Complaint  Patient presents with  . Fall    HPI Manuel Friedman is a 78 y.o. male.     HPI Patient reports that he has been recovering from [place].  He had a hospitalization but has been home now for about 10 days.  He reports he is doing much better.  He does not feel short of breath anymore.  He is continue to monitor his oxygen saturation which is doing well.  He reports however he is taking his blood pressure medications and he feels it is making his blood pressure too low.  He reports he stood up today and got kind of weak feeling and fell to the floor.  Reports that he hit his left chest wall when he fell.  He denies he struck his head.  No headache or neck pain.  No weakness numbness tingling in the extremities.  No abdominal pain.  He reports it feels something like when he had a broken rib from a prior injury a number of years ago.  It hurts if he moves certain directions or takes a deep breath.  He reports he is a very specific spot over a couple ribs on the side of his chest on the left. Past Medical History:  Diagnosis Date  . Adenomatous colon polyp   . Arthritis   . CAD (coronary artery disease)    s/p cath 03/2011 with total RCA, left to right collaterals and mild nonobstructive disease in the LAD and LCX. Trying medical management but could attempt PCI of the RCA  . Cancer ([place])    skin cancer  . Cataract    Bil/had surgery  . COPD (chronic obstructive pulmonary disease) ([place])   . Decreased hearing    wears hearing aid/Bil  . Depression   . Diverticulosis   . Esophageal stricture   . Hiatal hernia   . HTN (hypertension)   . Hyperlipemia   . Internal and external hemorrhoids without complication   . Lung nodule    Noted on CTA Jan 2013. Needs follow upstudy  . Peripheral vascular disease ([place])   . Rosacea   .  Swelling of ankle, right   . Umbilical hernia    hx    Patient Active Problem List   Diagnosis Date Noted  . Elevated liver enzymes 11/09/2018  . COVID-19 virus infection 11/06/2018  . Generalized weakness 11/06/2018  . PAD (peripheral artery disease) ([place]) 04/06/2017  . Rosacea   . Internal and external hemorrhoids without complication   . Hyperlipemia   . HTN (hypertension)   . Hiatal hernia   . Esophageal stricture   . Diverticulosis   . Depression   . Decreased hearing   . COPD (chronic obstructive pulmonary disease) ([place])   . Arthritis   . Adenomatous colon polyp   . Peripheral arterial disease ([place]) 02/03/2017  . Abdominal aortic aneurysm ([place]) 07/04/2014  . Personal history of colonic polyps 03/27/2012  . Cerebrovascular disease 09/26/2011  . CAD (coronary artery disease) 04/18/2011  . Lung nodule 04/18/2011  . Chest pain 03/07/2011  . Hypertension 03/07/2011  . Hyperlipidemia 03/07/2011    Past Surgical History:  Procedure Laterality Date  . cataract surgery     Bil  . COLONOSCOPY W/ BIOPSIES AND POLYPECTOMY  01/30/2007   diverticulosis, internal and external hemorrhoids  . ENDARTERECTOMY FEMORAL  Right 04/06/2017   Procedure: ENDARTERECTOMY RIGHT COMMON FEMORAL ARTERY;  Surgeon: Angelia Mould, MD;  Location: [facility];  Service: Vascular;  Laterality: Right;  . LOWER EXTREMITY ANGIOGRAPHY N/A 03/27/2017   Procedure: LOWER EXTREMITY ANGIOGRAPHY;  Surgeon: Lorretta Harp, MD;  Location: [facility];  Service: Cardiovascular;  Laterality: N/A;  . PATCH ANGIOPLASTY Right 04/06/2017   Procedure: PATCH ANGIOPLASTY USING RIGHT GREATER SAPHENOUS VEIN ON RIGHT COMMON FEMORAL ARTERY;  Surgeon: Angelia Mould, MD;  Location: [facility];  Service: Vascular;  Laterality: Right;  . TONSILLECTOMY    . UPPER GASTROINTESTINAL ENDOSCOPY  02/19/2009   stricture, GERD, Hiatal hernia        Home Medications    Prior to Admission medications   Medication Sig  Start Date End Date Taking? Authorizing Provider  azithromycin (ZITHROMAX) 250 MG tablet 2 tablets p.o. daily 11/11/18   Allie Bossier, MD  cetirizine (ZYRTEC) 10 MG tablet Take 10 mg by mouth as needed for allergies.     [provider]  Coenzyme Q10 (COQ-10 PO) Take 1 tablet by mouth daily.      [provider]  docusate sodium (COLACE) 100 MG capsule Take 1 capsule (100 mg total) by mouth every 12 (twelve) hours. 11/21/18   Charlesetta Shanks, MD  escitalopram (LEXAPRO) 10 MG tablet Take 1 tablet (10 mg total) by mouth daily. 11/11/18   Allie Bossier, MD  famotidine (PEPCID) 20 MG tablet Take 1 tablet (20 mg total) by mouth 2 (two) times daily. 11/11/18   Allie Bossier, MD  isosorbide mononitrate (IMDUR) 60 MG 24 hr tablet TAKE 1 TABLET EVERY DAY Patient taking differently: Take 60 mg by mouth daily.  06/18/18   Lelon Perla, MD  losartan (COZAAR) 50 MG tablet Take 25 mg by mouth daily.     [provider]  magnesium oxide (MAG-OX) 400 MG tablet Take 400 mg by mouth daily.    [provider]  Multiple Vitamin (MULTIVITAMIN WITH MINERALS) TABS tablet Take 1 tablet by mouth daily.    [provider]  naproxen sodium (ALEVE) 220 MG tablet Take 220 mg by mouth daily as needed (pain).    [provider]  oxyCODONE-acetaminophen (PERCOCET) 5-325 MG tablet Take 1-2 tablets by mouth every 6 (six) hours as needed. 11/21/18   Charlesetta Shanks, MD  Tiotropium Bromide-Olodaterol (STIOLTO RESPIMAT) 2.5-2.5 MCG/ACT AERS Inhale 1 puff into the lungs 2 (two) times daily as needed (SOB). 11/11/18   Allie Bossier, MD  vitamin B-12 (CYANOCOBALAMIN) 250 MCG tablet Take 250 mcg by mouth daily.    [provider]  vitamin C (VITAMIN C) 500 MG tablet Take 1 tablet (500 mg total) by mouth daily. 11/11/18   Allie Bossier, MD  zinc sulfate 220 (50 Zn) MG capsule Take 1 capsule (220 mg total) by mouth daily. 11/11/18   Allie Bossier, MD  zolpidem (AMBIEN) 5  MG tablet Take 5 mg by mouth at bedtime.     [provider]    Family History Family History  Problem Relation Age of Onset  . Pancreatic cancer Mother   . Multiple myeloma Father   . Colon cancer Neg Hx   . Stomach cancer Neg Hx     Social History Social History   Tobacco Use  . Smoking status: Former Smoker    Quit date: 02/29/1992    Years since quitting: 26.7  . Smokeless tobacco: Never Used  Substance Use Topics  .  Alcohol use: Yes    Alcohol/week: 7.0 standard drinks    Types: 7 Standard drinks or equivalent per week  . Drug use: No     Allergies   Patient has no known allergies.   Review of Systems Review of Systems 10 Systems reviewed and are negative for acute change except as noted in the HPI.  Physical Exam Updated Vital Signs BP 112/76   Pulse 87   Temp 98.3 F (36.8 C) (Oral)   Resp 18   SpO2 96%   Physical Exam Constitutional:      Comments: Patient is alert and appropriate.  He is clinically well in appearance.  He is nontoxic without any respiratory distress.  HENT:     Head: Normocephalic and atraumatic.     Mouth/Throat:     Mouth: Mucous membranes are moist.     Pharynx: Oropharynx is clear.  Eyes:     Extraocular Movements: Extraocular movements intact.  Neck:     Musculoskeletal: Neck supple.  Cardiovascular:     Rate and Rhythm: Normal rate and regular rhythm.  Pulmonary:     Effort: Pulmonary effort is normal.     Breath sounds: Normal breath sounds.     Comments: Patient has very tender palpation to the left chest wall at about ribs 6 through 7 just about below the nipple line.  No crepitus and no abrasions.  Very painful when he takes a deep breath or twists. Chest:     Chest wall: Tenderness present.  Abdominal:     General: There is no distension.     Palpations: Abdomen is soft.     Tenderness: There is no abdominal tenderness. There is no left CVA tenderness or guarding.  Musculoskeletal: Normal range of  motion.        General: No swelling or tenderness.     Right lower leg: No edema.     Left lower leg: No edema.  Skin:    General: Skin is warm and dry.  Neurological:     General: No focal deficit present.     Mental Status: He is oriented to person, place, and time.     Coordination: Coordination normal.  Psychiatric:        Mood and Affect: Mood normal.      ED Treatments / Results  Labs (all labs ordered are listed, but only abnormal results are displayed) Labs Reviewed - No data to display  EKG None  Radiology Dg Ribs Unilateral W/chest Left  Result Date: 11/21/2018 CLINICAL DATA:  Pain status post fall EXAM: LEFT RIBS AND CHEST - 3+ VIEW COMPARISON:  Left-sided rib pain. FINDINGS: Multifocal airspace opacities are again noted throughout both lung fields. These have worsened since the prior study. The heart size remains enlarged. Aortic calcifications are noted. There is no pneumothorax. No large pleural effusion. There is no evidence for a displaced left-sided rib fracture. IMPRESSION: 1. No displaced rib fracture. 2. No pneumothorax. 3. Worsening multifocal airspace opacities concerning for an atypical infectious process such as viral pneumonia. Electronically Signed   By: Constance Holster M.D.   On: 11/21/2018 17:51    Procedures Procedures (including critical care time)  Medications Ordered in ED Medications  oxyCODONE-acetaminophen (PERCOCET/ROXICET) 5-325 MG per tablet 1 tablet (1 tablet Oral Given 11/21/18 1650)     Initial Impression / Assessment and Plan / ED Course  I have reviewed the triage vital signs and the nursing notes.  Pertinent labs & imaging results that were available  during my care of the patient were reviewed by me and considered in my medical decision making (see chart for details).        Clinically the patient is well in appearance.  He is in no distress.  Pain is precipitated by movements and deep breaths.  He does continue to have  infiltrates per radiology interpretation of the chest x-ray.  Clinically however patient shows no dyspnea and has good oxygen saturation.  He reports that his breathing is much better than it was previously.  He has not had any recrudescence of fever.  At this time is stable for discharge.  He does have close follow-up with his PCP.  He is going to discuss his blood pressure medication dosing tomorrow.  He was mildly hypotensive upon arrival but this has self corrected without intervention.  Patient is very alert and has no symptoms with standing and moving about.  He can get up and down from the stretcher without lightheadedness or difficulty.  Return precautions reviewed.  Patient will take 1-2 Percocet every 6 hours for pain and Colace to avoid constipation.  Return precautions reviewed.  Final Clinical Impressions(s) / ED Diagnoses   Final diagnoses:  Contusion of left chest wall, initial encounter    ED Discharge Orders         Ordered    oxyCODONE-acetaminophen (PERCOCET) 5-325 MG tablet  Every 6 hours PRN     11/21/18 1831    docusate sodium (COLACE) 100 MG capsule  Every 12 hours     11/21/18 1831           Charlesetta Shanks, MD 11/21/18 Bosie Helper

## 2018-11-21 NOTE — ED Triage Notes (Signed)
Pt presents with c/o fall that occurred this morning. Pt reports that he has been recovering at home since being diagnosed with COVID several weeks ago and discharged from [facility]. Pt reports that he fell and his left rib cage is very sore to the touch.

## 2018-11-21 NOTE — ED Notes (Signed)
Pt d/c home per MD order. Discharge summary reviewed with pt, pt verbalizes understanding. Reports ride is here for discharge. Off unit via WC-

## 2018-11-21 NOTE — Discharge Instructions (Addendum)
1.  You may take 1-2 Percocet tablets every 6 hours for pain control.  Try to move around and take deep breaths every couple of hours. 2.  Your chest x-ray still shows abnormalities from having had COVID.  Since you are doing much better, continue caring for yourself as you have been.  Return to the emergency department if you become short of breath or develop fever. 3.  Your pain medications may cause constipation.  You have been given a prescription for Colace as a stool softener to prevent developing constipation while taking Percocet.

## 2018-11-23 DIAGNOSIS — I739 Peripheral vascular disease, unspecified: Secondary | ICD-10-CM | POA: Diagnosis not present

## 2018-11-23 DIAGNOSIS — I2510 Atherosclerotic heart disease of native coronary artery without angina pectoris: Secondary | ICD-10-CM | POA: Diagnosis not present

## 2018-11-23 DIAGNOSIS — U071 COVID-19: Secondary | ICD-10-CM | POA: Diagnosis not present

## 2018-11-23 DIAGNOSIS — R0781 Pleurodynia: Secondary | ICD-10-CM | POA: Diagnosis not present

## 2018-11-23 DIAGNOSIS — I10 Essential (primary) hypertension: Secondary | ICD-10-CM | POA: Diagnosis not present

## 2018-11-26 DIAGNOSIS — E785 Hyperlipidemia, unspecified: Secondary | ICD-10-CM | POA: Diagnosis not present

## 2018-11-26 DIAGNOSIS — Z87891 Personal history of nicotine dependence: Secondary | ICD-10-CM | POA: Diagnosis not present

## 2018-11-26 DIAGNOSIS — I2510 Atherosclerotic heart disease of native coronary artery without angina pectoris: Secondary | ICD-10-CM | POA: Diagnosis not present

## 2018-11-26 DIAGNOSIS — M479 Spondylosis, unspecified: Secondary | ICD-10-CM | POA: Diagnosis not present

## 2018-11-26 DIAGNOSIS — Z85828 Personal history of other malignant neoplasm of skin: Secondary | ICD-10-CM | POA: Diagnosis not present

## 2018-11-26 DIAGNOSIS — I119 Hypertensive heart disease without heart failure: Secondary | ICD-10-CM | POA: Diagnosis not present

## 2018-11-26 DIAGNOSIS — F329 Major depressive disorder, single episode, unspecified: Secondary | ICD-10-CM | POA: Diagnosis not present

## 2018-11-26 DIAGNOSIS — R945 Abnormal results of liver function studies: Secondary | ICD-10-CM | POA: Diagnosis not present

## 2018-11-26 DIAGNOSIS — U071 COVID-19: Secondary | ICD-10-CM | POA: Diagnosis not present

## 2018-11-26 IMAGING — CR DG CHEST 2V
2 series · 2 of 2 positions shown · non-contrast
Comparison: Chest CT July 15, 2013

CLINICAL DATA: Preoperative evaluation for lower extremity vascular
stent placement

EXAM:
CHEST  2 VIEW

[w chest pa]
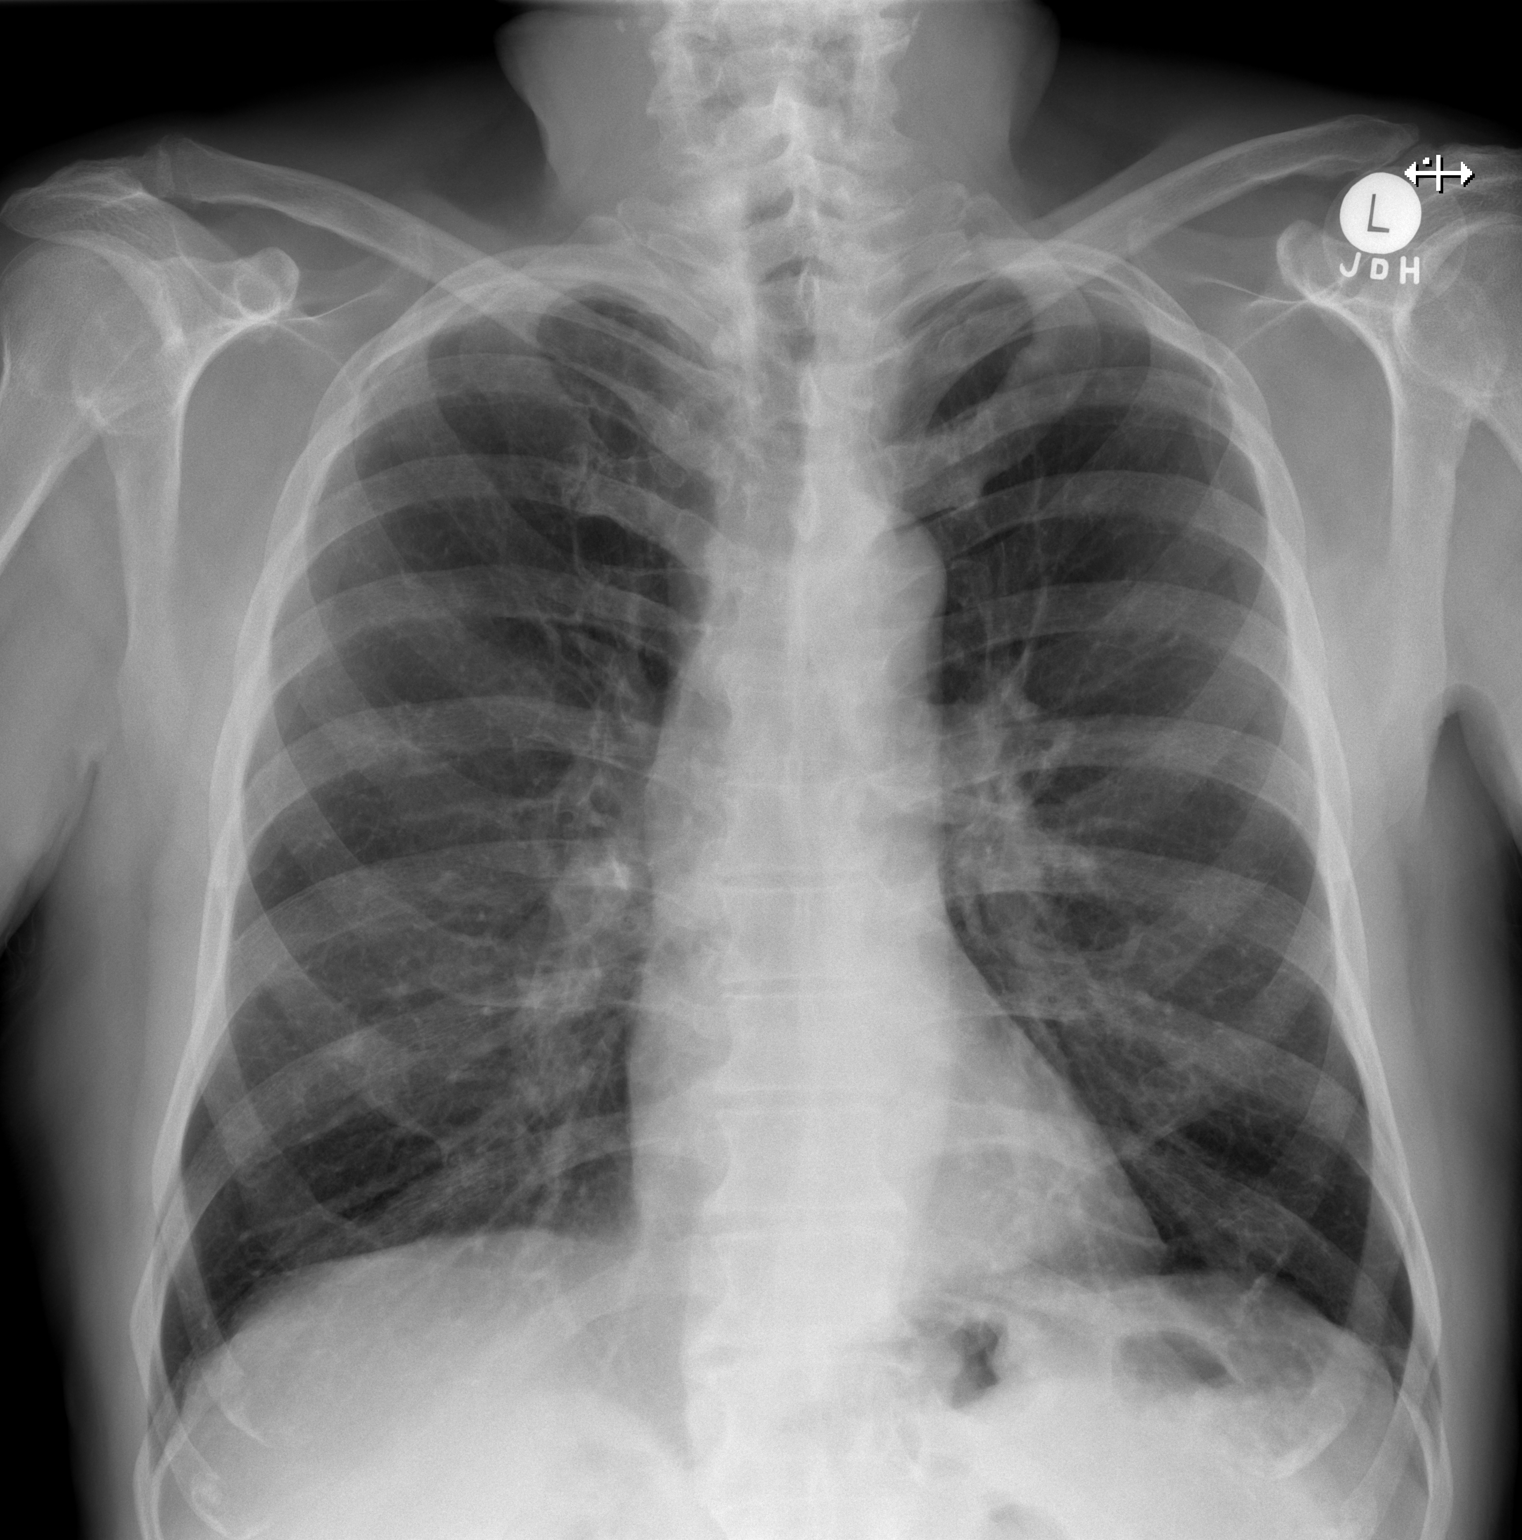

[w chest lat]
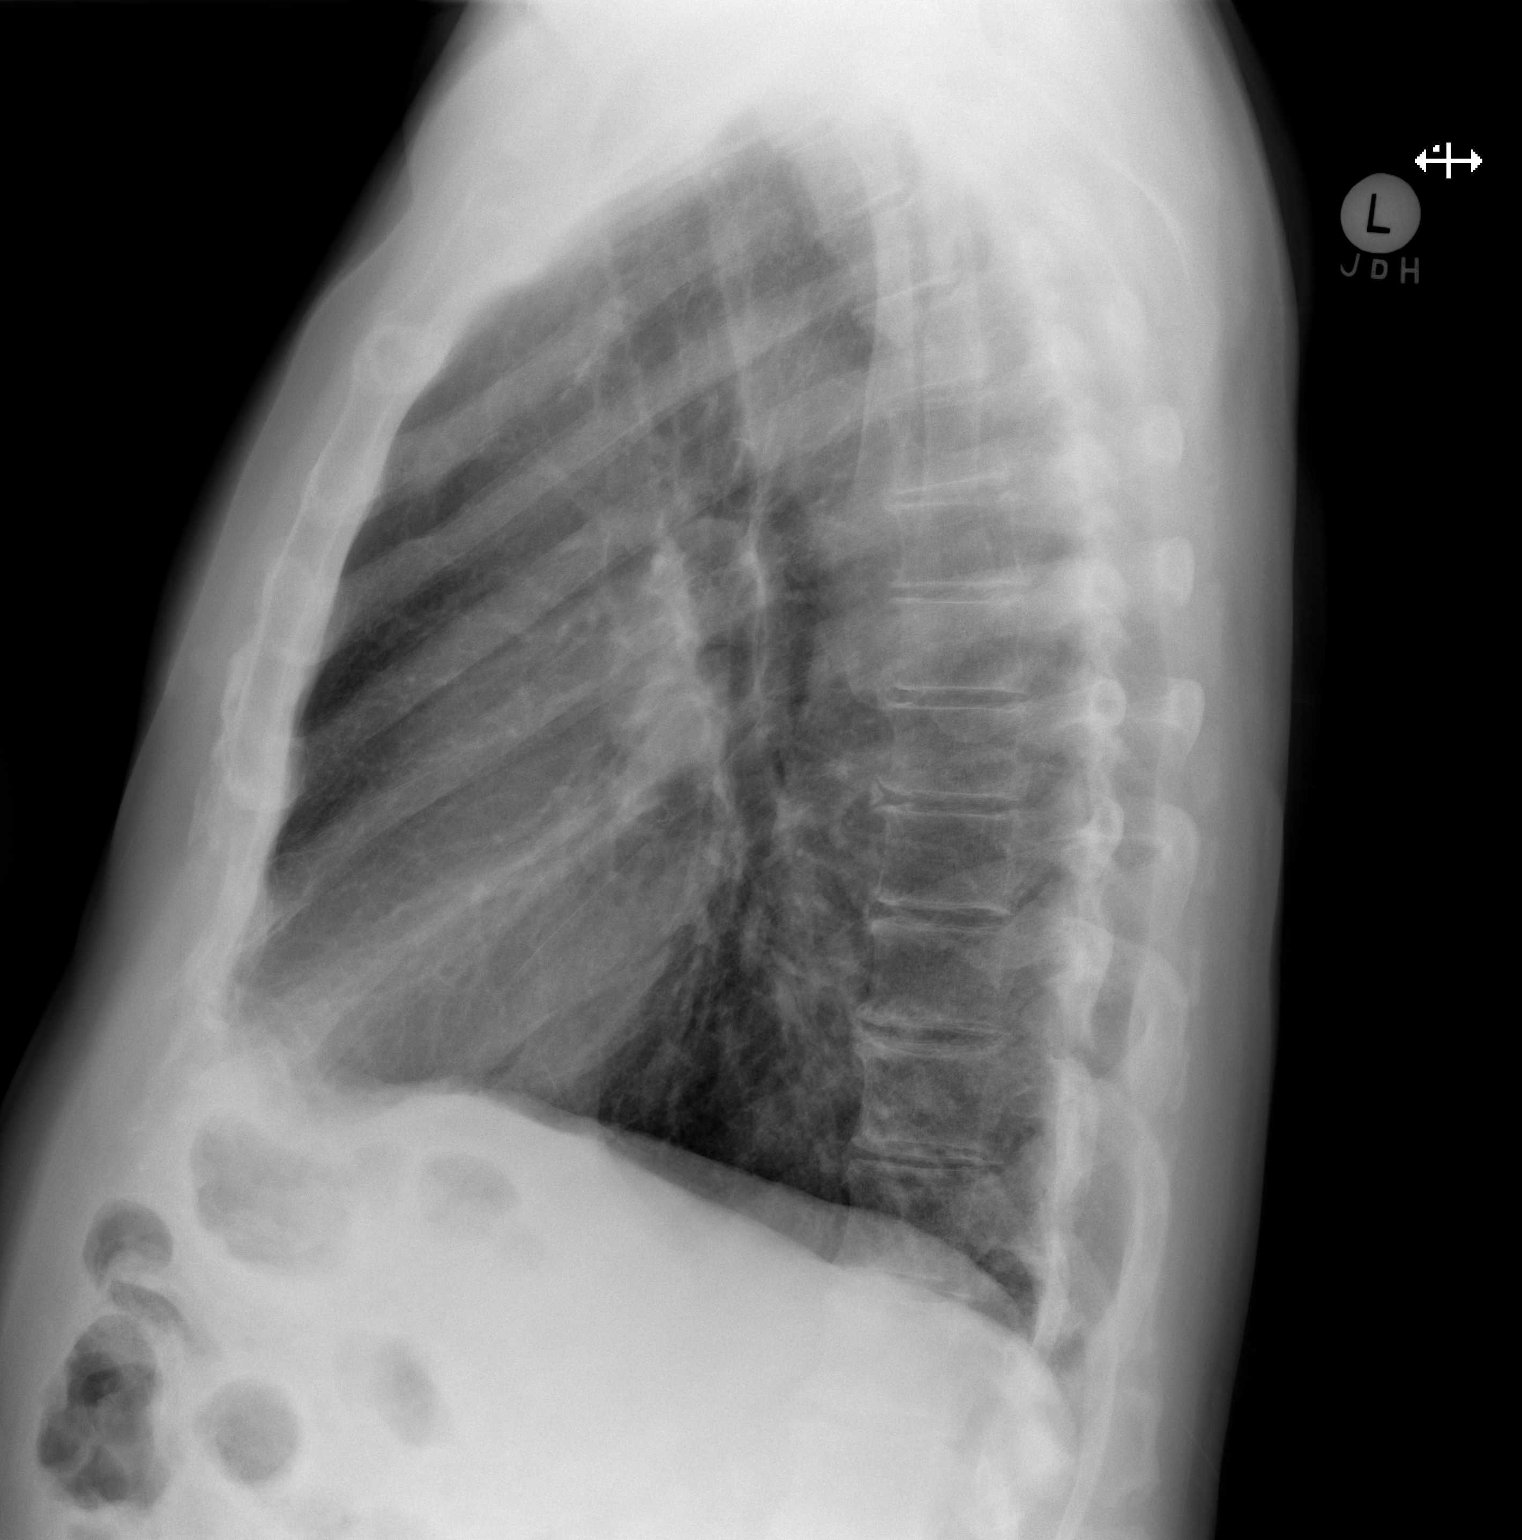

[2 of 2 positions shown; findings below may reference images not displayed]

FINDINGS: There is no edema or consolidation. The heart size and pulmonary
vascularity are normal. No adenopathy. There is aortic
atherosclerosis. No evident bone lesions.
IMPRESSION: Aortic atherosclerosis.  No edema or consolidation.

Aortic Atherosclerosis ([id number].D).

## 2018-12-03 DIAGNOSIS — I2510 Atherosclerotic heart disease of native coronary artery without angina pectoris: Secondary | ICD-10-CM | POA: Diagnosis not present

## 2018-12-03 DIAGNOSIS — R945 Abnormal results of liver function studies: Secondary | ICD-10-CM | POA: Diagnosis not present

## 2018-12-03 DIAGNOSIS — Z87891 Personal history of nicotine dependence: Secondary | ICD-10-CM | POA: Diagnosis not present

## 2018-12-03 DIAGNOSIS — M479 Spondylosis, unspecified: Secondary | ICD-10-CM | POA: Diagnosis not present

## 2018-12-03 DIAGNOSIS — Z85828 Personal history of other malignant neoplasm of skin: Secondary | ICD-10-CM | POA: Diagnosis not present

## 2018-12-03 DIAGNOSIS — I119 Hypertensive heart disease without heart failure: Secondary | ICD-10-CM | POA: Diagnosis not present

## 2018-12-03 DIAGNOSIS — F329 Major depressive disorder, single episode, unspecified: Secondary | ICD-10-CM | POA: Diagnosis not present

## 2018-12-03 DIAGNOSIS — E785 Hyperlipidemia, unspecified: Secondary | ICD-10-CM | POA: Diagnosis not present

## 2018-12-03 DIAGNOSIS — U071 COVID-19: Secondary | ICD-10-CM | POA: Diagnosis not present

## 2018-12-12 DIAGNOSIS — E291 Testicular hypofunction: Secondary | ICD-10-CM | POA: Diagnosis not present

## 2018-12-12 DIAGNOSIS — R7301 Impaired fasting glucose: Secondary | ICD-10-CM | POA: Diagnosis not present

## 2018-12-12 DIAGNOSIS — E538 Deficiency of other specified B group vitamins: Secondary | ICD-10-CM | POA: Diagnosis not present

## 2018-12-12 DIAGNOSIS — Z125 Encounter for screening for malignant neoplasm of prostate: Secondary | ICD-10-CM | POA: Diagnosis not present

## 2018-12-12 DIAGNOSIS — E785 Hyperlipidemia, unspecified: Secondary | ICD-10-CM | POA: Diagnosis not present

## 2018-12-13 DIAGNOSIS — Z23 Encounter for immunization: Secondary | ICD-10-CM | POA: Diagnosis not present

## 2018-12-13 DIAGNOSIS — R82998 Other abnormal findings in urine: Secondary | ICD-10-CM | POA: Diagnosis not present

## 2018-12-13 DIAGNOSIS — I10 Essential (primary) hypertension: Secondary | ICD-10-CM | POA: Diagnosis not present

## 2018-12-19 DIAGNOSIS — N401 Enlarged prostate with lower urinary tract symptoms: Secondary | ICD-10-CM | POA: Diagnosis not present

## 2018-12-19 DIAGNOSIS — I39 Endocarditis and heart valve disorders in diseases classified elsewhere: Secondary | ICD-10-CM | POA: Diagnosis not present

## 2018-12-19 DIAGNOSIS — I714 Abdominal aortic aneurysm, without rupture: Secondary | ICD-10-CM | POA: Diagnosis not present

## 2018-12-19 DIAGNOSIS — Z0000 Encounter for general adult medical examination without abnormal findings: Secondary | ICD-10-CM | POA: Diagnosis not present

## 2018-12-19 DIAGNOSIS — U071 COVID-19: Secondary | ICD-10-CM | POA: Diagnosis not present

## 2018-12-19 DIAGNOSIS — I739 Peripheral vascular disease, unspecified: Secondary | ICD-10-CM | POA: Diagnosis not present

## 2018-12-19 DIAGNOSIS — R0781 Pleurodynia: Secondary | ICD-10-CM | POA: Diagnosis not present

## 2018-12-19 DIAGNOSIS — Z1331 Encounter for screening for depression: Secondary | ICD-10-CM | POA: Diagnosis not present

## 2018-12-19 DIAGNOSIS — K429 Umbilical hernia without obstruction or gangrene: Secondary | ICD-10-CM | POA: Diagnosis not present

## 2018-12-19 DIAGNOSIS — F329 Major depressive disorder, single episode, unspecified: Secondary | ICD-10-CM | POA: Diagnosis not present

## 2019-01-09 ENCOUNTER — Ambulatory Visit: Payer: Medicare HMO | Admitting: Internal Medicine

## 2019-01-09 ENCOUNTER — Encounter: Payer: Self-pay | Admitting: Internal Medicine

## 2019-01-09 ENCOUNTER — Other Ambulatory Visit: Payer: Self-pay

## 2019-01-09 VITALS — BP 110/70 | HR 60 | Temp 98.5°F | Ht 72.0 in | Wt 199.4 lb

## 2019-01-09 DIAGNOSIS — Z8616 Personal history of COVID-19: Secondary | ICD-10-CM

## 2019-01-09 DIAGNOSIS — R1310 Dysphagia, unspecified: Secondary | ICD-10-CM | POA: Diagnosis not present

## 2019-01-09 DIAGNOSIS — Z8619 Personal history of other infectious and parasitic diseases: Secondary | ICD-10-CM

## 2019-01-09 DIAGNOSIS — R1319 Other dysphagia: Secondary | ICD-10-CM

## 2019-01-09 NOTE — Patient Instructions (Signed)
  You have been scheduled for an endoscopy. Please follow written instructions given to you at your visit today. If you use inhalers (even only as needed), please bring them with you on the day of your procedure.   I appreciate the opportunity to care for you. Carl Gessner, MD, FACG 

## 2019-01-09 NOTE — Progress Notes (Signed)
Manuel Friedman 78 y.o. July 19, 1940 [id number]  Assessment & Plan:   Encounter Diagnoses  Name Primary?  . Esophageal dysphagia Yes  . History of 2019 novel coronavirus disease (COVID-19)    Schedule upper GI endoscopy with likely esophageal dilation.  Consider post dilation medical therapy i.e. PPI acid reduction pending findings.  The risks and benefits as well as alternatives of endoscopic procedure(s) have been discussed and reviewed. All questions answered. The patient agrees to proceed.  Since he had coronavirus he does not require preprocedure testing for coronavirus     Subjective:   Chief Complaint: Dysphagia  HPI Manuel Friedman is here with a complaint of dysphagia about 1 time a month usually with his evening meal. 1x/momth dysphagia - usu dinner time Sudden impact dysphagia and regurgitation w/ solids - e.g. meat  Wt Readings from Last 3 Encounters:  01/09/19 199 lb 6.4 oz (90.4 kg)  11/06/18 205 lb (93 kg)  02/23/18 208 lb (94.3 kg)   He did have coronavirus this summer and was hospitalized for a while, he and his wife both contracted it she did not get hospitalized, probably contracted it from his wife sister when they were in close contact for a while  He does not complain of significant heartburn  In 2010 he did have an esophageal stricture and I had him on PPI after that, it was dilated with good response for dysphagia No Known Allergies Current Meds  Medication Sig  . cetirizine (ZYRTEC) 10 MG tablet Take 10 mg by mouth as needed for allergies.   . Coenzyme Q10 (COQ-10 PO) Take 1 tablet by mouth daily.    Marland Kitchen escitalopram (LEXAPRO) 10 MG tablet Take 1 tablet (10 mg total) by mouth daily.  . magnesium oxide (MAG-OX) 400 MG tablet Take 400 mg by mouth daily.  . Multiple Vitamin (MULTIVITAMIN WITH MINERALS) TABS tablet Take 1 tablet by mouth daily.  . naproxen sodium (ALEVE) 220 MG tablet Take 220 mg by mouth daily as needed (pain).  Marland Kitchen zolpidem  (AMBIEN) 5 MG tablet Take 5 mg by mouth at bedtime.    Past Medical History:  Diagnosis Date  . Adenomatous colon polyp   . Arthritis   . CAD (coronary artery disease)    s/p cath 03/2011 with total RCA, left to right collaterals and mild nonobstructive disease in the LAD and LCX. Trying medical management but could attempt PCI of the RCA  . Cancer ([place])    skin cancer  . Cataract    Bil/had surgery  . COPD (chronic obstructive pulmonary disease) ([place])   . COVID-19 virus infection 11/06/2018  . Decreased hearing    wears hearing aid/Bil  . Depression   . Diverticulosis   . Esophageal stricture   . Hiatal hernia   . HTN (hypertension)   . Hyperlipemia   . Internal and external hemorrhoids without complication   . Lung nodule    Noted on CTA Jan 2013. Needs follow upstudy  . Peripheral vascular disease ([place])   . Rosacea   . Swelling of ankle, right   . Umbilical hernia    hx   Past Surgical History:  Procedure Laterality Date  . cataract surgery     Bil  . COLONOSCOPY W/ BIOPSIES AND POLYPECTOMY  01/30/2007   diverticulosis, internal and external hemorrhoids  . ENDARTERECTOMY FEMORAL Right 04/06/2017   Procedure: ENDARTERECTOMY RIGHT COMMON FEMORAL ARTERY;  Surgeon: Angelia Mould, MD;  Location: [facility];  Service: Vascular;  Laterality: Right;  .  LOWER EXTREMITY ANGIOGRAPHY N/A 03/27/2017   Procedure: LOWER EXTREMITY ANGIOGRAPHY;  Surgeon: Lorretta Harp, MD;  Location: [facility];  Service: Cardiovascular;  Laterality: N/A;  . PATCH ANGIOPLASTY Right 04/06/2017   Procedure: PATCH ANGIOPLASTY USING RIGHT GREATER SAPHENOUS VEIN ON RIGHT COMMON FEMORAL ARTERY;  Surgeon: Angelia Mould, MD;  Location: [facility];  Service: Vascular;  Laterality: Right;  . TONSILLECTOMY    . UPPER GASTROINTESTINAL ENDOSCOPY  02/19/2009   stricture, GERD, Hiatal hernia   Social History   Social History Narrative   Married and retired   2 adult children   Former smoker, 1  drink a day no drugs   family history includes Multiple myeloma in his father; Pancreatic cancer in his mother.   Review of Systems   Objective:   Physical Exam '@BP'  110/70   Pulse 60   Temp 98.5 F (36.9 C)   Ht 6' (1.829 m)   Wt 199 lb 6.4 oz (90.4 kg)   BMI 27.04 kg/m @  General:  NAD Eyes:   anicteric Lungs:  clear Heart::  S1S2 no rubs, murmurs or gallops Abdomen:  soft and nontender, BS+ Ext:   no edema, cyanosis or clubbing    Data Reviewed:  As per HPI, he feels fully recovered from a virus

## 2019-01-12 ENCOUNTER — Encounter: Payer: Self-pay | Admitting: Internal Medicine

## 2019-01-21 ENCOUNTER — Encounter: Payer: Self-pay | Admitting: Internal Medicine

## 2019-01-24 ENCOUNTER — Other Ambulatory Visit: Payer: Self-pay

## 2019-01-24 ENCOUNTER — Encounter: Payer: Self-pay | Admitting: Internal Medicine

## 2019-01-24 ENCOUNTER — Ambulatory Visit (AMBULATORY_SURGERY_CENTER): Payer: Medicare HMO | Admitting: Internal Medicine

## 2019-01-24 VITALS — BP 110/68 | HR 69 | Temp 97.9°F | Resp 18 | Ht 72.0 in | Wt 199.0 lb

## 2019-01-24 DIAGNOSIS — K222 Esophageal obstruction: Secondary | ICD-10-CM

## 2019-01-24 DIAGNOSIS — R1310 Dysphagia, unspecified: Secondary | ICD-10-CM

## 2019-01-24 DIAGNOSIS — K449 Diaphragmatic hernia without obstruction or gangrene: Secondary | ICD-10-CM

## 2019-01-24 MED ORDER — SODIUM CHLORIDE 0.9 % IV SOLN: 500.0000 mL | Freq: Once | INTRAVENOUS | Status: AC

## 2019-01-24 MED ORDER — PANTOPRAZOLE SODIUM 20 MG PO TBEC: 20.0000 mg | DELAYED_RELEASE_TABLET | Freq: Every day | ORAL | 3 refills | Status: DC

## 2019-01-24 NOTE — Progress Notes (Signed)
PT taken to PACU. Monitors in place. VSS. Report given to RN. 

## 2019-01-24 NOTE — Op Note (Signed)
[facility] Patient Name: Manuel Friedman Procedure Date: 01/24/2019 9:58 AM MRN: [id number] Endoscopist: Gatha Mayer , MD Age: 78 Referring MD:  Date of Birth: 23-Nov-1940 Gender: Male Account #: [id number] Procedure:                Upper GI endoscopy Indications:              Dysphagia Medicines:                Propofol per Anesthesia, Monitored Anesthesia Care Procedure:                Pre-Anesthesia Assessment:                           - Prior to the procedure, a History and Physical                            was performed, and patient medications and                            allergies were reviewed. The patient's tolerance of                            previous anesthesia was also reviewed. The risks                            and benefits of the procedure and the sedation                            options and risks were discussed with the patient.                            All questions were answered, and informed consent                            was obtained. Prior Anticoagulants: The patient has                            taken no previous anticoagulant or antiplatelet                            agents. ASA Grade Assessment: III - A patient with                            severe systemic disease. After reviewing the risks                            and benefits, the patient was deemed in                            satisfactory condition to undergo the procedure.                           After obtaining informed consent, the endoscope was  passed under direct vision. Throughout the                            procedure, the patient's blood pressure, pulse, and                            oxygen saturations were monitored continuously. The                            Endoscope was introduced through the mouth, and                            advanced to the second part of duodenum. The upper                            GI endoscopy was  accomplished without difficulty.                            The patient tolerated the procedure well. Scope In: Scope Out: Findings:                 One benign-appearing, intrinsic moderate stenosis                            was found at the gastroesophageal junction. This                            stenosis measured 1.5 cm (inner diameter). The                            stenosis was traversed. A TTS dilator was passed                            through the scope. Dilation with an 18-19-20 mm                            balloon dilator was performed to 18 mm. The                            dilation site was examined and showed moderate                            mucosal disruption. Estimated blood loss was                            minimal.                           A 5 cm hiatal hernia was present.                           The cardia and gastric fundus were normal on                            retroflexion.  The exam was otherwise without abnormality. Complications:            No immediate complications. Estimated Blood Loss:     Estimated blood loss was minimal. Impression:               - Benign-appearing esophageal stenosis. Dilated.                           - 5 cm hiatal hernia.                           - The examination was otherwise normal.                           - No specimens collected. Recommendation:           - Patient has a contact number available for                            emergencies. The signs and symptoms of potential                            delayed complications were discussed with the                            patient. Return to normal activities tomorrow.                            Written discharge instructions were provided to the                            patient.                           - Clear liquids x 1 hour then soft foods rest of                            day. Start prior diet tomorrow.                            - Continue present medications.                           - Follow an antireflux regimen.                           - Use Protonix (pantoprazole) 20 mg PO daily. Gatha Mayer, MD 01/24/2019 10:29:12 AM This report has been signed electronically.

## 2019-01-24 NOTE — Progress Notes (Signed)
Called to room to assist during endoscopic procedure.  Patient ID and intended procedure confirmed with present staff. Received instructions for my participation in the procedure from the performing physician.  

## 2019-01-24 NOTE — Patient Instructions (Addendum)
I dilated a stricture (scar tissue from acid reflux) in the esophagus.  You also have a hiatal hernia (stomach moves into chest cavity).  Acid reflux or GERD is the primary issue here.  I am starting you on pantoprazole 20 mg daily - sent to [facility]. Start and stay on that to reduce acid and chance of needing another dilation.   Follow an antireflux regimen.  This includes:      - Do not lie down for at least 3 to 4 hours after meals.       - Raise the head of the bed 4 to 6 inches.       - Decrease excess weight.       - Avoid citrus juices and other acidic foods, alcohol, chocolate, mints, coffee and other caffeinated beverages, carbonated beverages, fatty and fried foods.       - Avoid tight-fitting clothing.       - Avoid cigarettes and other tobacco products.   I appreciate the opportunity to care for you. Gatha Mayer, MD, FACG     YOU HAD AN ENDOSCOPIC PROCEDURE TODAY AT [facility]:   Refer to the procedure report that was given to you for any specific questions about what was found during the examination.  If the procedure report does not answer your questions, please call your gastroenterologist to clarify.  If you requested that your care partner not be given the details of your procedure findings, then the procedure report has been included in a sealed envelope for you to review at your convenience later.  YOU SHOULD EXPECT: Some feelings of bloating in the abdomen. Passage of more gas than usual.  Walking can help get rid of the air that was put into your GI tract during the procedure and reduce the bloating. If you had a lower endoscopy (such as a colonoscopy or flexible sigmoidoscopy) you may notice spotting of blood in your stool or on the toilet paper. If you underwent a bowel prep for your procedure, you may not have a normal bowel movement for a few days.  Please Note:  You might notice some irritation and congestion in your nose or some drainage.   This is from the oxygen used during your procedure.  There is no need for concern and it should clear up in a day or so.  SYMPTOMS TO REPORT IMMEDIATELY:  Following upper endoscopy (EGD)  Vomiting of blood or coffee ground material  New chest pain or pain under the shoulder blades  Painful or persistently difficult swallowing  New shortness of breath  Fever of 100F or higher  Black, tarry-looking stools  For urgent or emergent issues, a gastroenterologist can be reached at any hour by calling [phone].   DIET:  We do recommend a small meal at first, but then you may proceed to your regular diet.  Drink plenty of fluids but you should avoid alcoholic beverages for 24 hours.  ACTIVITY:  You should plan to take it easy for the rest of today and you should NOT DRIVE or use heavy machinery until tomorrow (because of the sedation medicines used during the test).    FOLLOW UP: Our staff will call the number listed on your records 48-72 hours following your procedure to check on you and address any questions or concerns that you may have regarding the information given to you following your procedure. If we do not reach you, we will leave a message.  We will  attempt to reach you two times.  During this call, we will ask if you have developed any symptoms of COVID 19. If you develop any symptoms (ie: fever, flu-like symptoms, shortness of breath, cough etc.) before then, please call [phone].  If you test positive for Covid 19 in the 2 weeks post procedure, please call and report this information to [place].    If any biopsies were taken you will be contacted by phone or by letter within the next 1-3 weeks.  Please call us at [phone] if you have not heard about the biopsies in 3 weeks.    SIGNATURES/CONFIDENTIALITY: You and/or your care partner have signed paperwork which will be entered into your electronic medical record.  These signatures attest to the fact that that the information  above on your After Visit Summary has been reviewed and is understood.  Full responsibility of the confidentiality of this discharge information lies with you and/or your care-partner.

## 2019-01-28 ENCOUNTER — Telehealth: Payer: Self-pay

## 2019-01-28 NOTE — Telephone Encounter (Signed)
  Follow up Call-  Call back number 01/24/2019 06/28/2017  Post procedure Call Back phone  # [phone]  [phone]  Permission to leave phone message Yes Yes  Some recent data might be hidden     Patient questions:  Do you have a fever, pain , or abdominal swelling? No. Pain Score  0 *  Have you tolerated food without any problems? Yes.    Have you been able to return to your normal activities? Yes.    Do you have any questions about your discharge instructions: Diet   No. Medications  No. Follow up visit  No.  Do you have questions or concerns about your Care? No.  Actions: * If pain score is 4 or above: No action needed, pain <4.  1. Have you developed a fever since your procedure? no  2.   Have you had an respiratory symptoms (SOB or cough) since your procedure? no  3.   Have you tested positive for COVID 19 since your procedure no  4.   Have you had any family members/close contacts diagnosed with the COVID 19 since your procedure?  no   If yes to any of these questions please route to Joylene John, RN and Alphonsa Gin, Therapist, sports.

## 2019-02-13 DIAGNOSIS — U071 COVID-19: Secondary | ICD-10-CM | POA: Diagnosis not present

## 2019-03-12 ENCOUNTER — Other Ambulatory Visit: Payer: Self-pay

## 2019-03-12 DIAGNOSIS — I714 Abdominal aortic aneurysm, without rupture, unspecified: Secondary | ICD-10-CM

## 2019-03-12 DIAGNOSIS — I779 Disorder of arteries and arterioles, unspecified: Secondary | ICD-10-CM

## 2019-03-13 ENCOUNTER — Encounter: Payer: Self-pay | Admitting: Vascular Surgery

## 2019-03-13 ENCOUNTER — Ambulatory Visit (INDEPENDENT_AMBULATORY_CARE_PROVIDER_SITE_OTHER)
Admission: RE | Admit: 2019-03-13 | Discharge: 2019-03-13 | Disposition: A | Discharge status: Home / Self Care | Payer: Medicare HMO | Source: Ambulatory Visit | Attending: Vascular Surgery | Admitting: Vascular Surgery

## 2019-03-13 ENCOUNTER — Ambulatory Visit (HOSPITAL_COMMUNITY)
Admission: RE | Admit: 2019-03-13 | Discharge: 2019-03-13 | Disposition: A | Discharge status: Home / Self Care | Payer: Medicare HMO | Source: Ambulatory Visit | Attending: Vascular Surgery | Admitting: Vascular Surgery

## 2019-03-13 ENCOUNTER — Ambulatory Visit (INDEPENDENT_AMBULATORY_CARE_PROVIDER_SITE_OTHER): Payer: Medicare HMO | Admitting: Vascular Surgery

## 2019-03-13 ENCOUNTER — Other Ambulatory Visit: Payer: Self-pay

## 2019-03-13 VITALS — BP 111/76 | HR 80 | Temp 98.0°F | Resp 20 | Ht 72.0 in | Wt 202.5 lb

## 2019-03-13 DIAGNOSIS — I714 Abdominal aortic aneurysm, without rupture, unspecified: Secondary | ICD-10-CM

## 2019-03-13 DIAGNOSIS — I739 Peripheral vascular disease, unspecified: Secondary | ICD-10-CM

## 2019-03-13 DIAGNOSIS — I779 Disorder of arteries and arterioles, unspecified: Secondary | ICD-10-CM | POA: Diagnosis not present

## 2019-03-13 NOTE — Progress Notes (Signed)
Patient name: Manuel Friedman MRN: [id number] DOB: 02/17/1941 Sex: male  REASON FOR VISIT:   Follow-up of abdominal aortic aneurysm and peripheral vascular disease.  HPI:   Manuel Friedman is a pleasant 78 y.o. male who I been following with a small abdominal aortic aneurysm and peripheral vascular disease.  He had presented with disabling claudication of the right lower extremity and underwent an arteriogram by Dr. Donnella Bi which showed a large bulky calcific plaque in the right common femoral artery.  Below that he had no significant infrainguinal arterial occlusive disease.  He underwent a right common femoral artery endarterectomy and superficial femoral artery endarterectomy with vein patch angioplasty.  This was in January 2019.  He had a known 3.6 cm infrarenal abdominal aortic aneurysm based on a study in 2017.  I recommended a follow-up study at this point.  Since I saw him last he denies any abdominal pain.  He does have chronic low back pain related to some lumbar disc disease.  He denies any claudication, rest pain, or nonhealing ulcers.  He is not a smoker.  He quit in 1993.  He was diagnosed with Covid earlier this year and was in the hospital for about 5 days but feels fine now.  He did note some occasional issues with vertigo but this appears to be related to times when his blood pressure was low.  He also had one episode of transient visual disturbance in both eyes.  He has no history of TIAs or stroke.  He is on a statin.  Past Medical History:  Diagnosis Date  . Adenomatous colon polyp   . Arthritis   . CAD (coronary artery disease)    s/p cath 03/2011 with total RCA, left to right collaterals and mild nonobstructive disease in the LAD and LCX. Trying medical management but could attempt PCI of the RCA  . Cancer ([place])    skin cancer  . Cataract    Bil/had surgery  . COPD (chronic obstructive pulmonary disease) ([place])   . COVID-19 virus infection 11/06/2018    . Decreased hearing    wears hearing aid/Bil  . Depression   . Diverticulosis   . Esophageal stricture   . Hiatal hernia   . HTN (hypertension)   . Hyperlipemia   . Internal and external hemorrhoids without complication   . Lung nodule    Noted on CTA Jan 2013. Needs follow upstudy  . Peripheral vascular disease ([place])   . Rosacea   . Swelling of ankle, right   . Umbilical hernia    hx    Family History  Problem Relation Age of Onset  . Pancreatic cancer Mother   . Multiple myeloma Father   . Colon cancer Neg Hx   . Stomach cancer Neg Hx   . Esophageal cancer Neg Hx   . Rectal cancer Neg Hx     SOCIAL HISTORY: Social History   Tobacco Use  . Smoking status: Former Smoker    Quit date: 02/29/1992    Years since quitting: 27.0  . Smokeless tobacco: Never Used  Substance Use Topics  . Alcohol use: Yes    Alcohol/week: 7.0 standard drinks    Types: 7 Standard drinks or equivalent per week    No Known Allergies  Current Outpatient Medications  Medication Sig Dispense Refill  . atorvastatin (LIPITOR) 80 MG tablet Take 80 mg by mouth daily.    . cetirizine (ZYRTEC) 10 MG tablet Take 10 mg by mouth as  needed for allergies.     . Coenzyme Q10 (COQ-10 PO) Take 1 tablet by mouth daily.      Marland Kitchen escitalopram (LEXAPRO) 10 MG tablet Take 1 tablet (10 mg total) by mouth daily. 30 tablet 0  . losartan (COZAAR) 25 MG tablet     . magnesium oxide (MAG-OX) 400 MG tablet Take 400 mg by mouth daily.    . Multiple Vitamin (MULTIVITAMIN WITH MINERALS) TABS tablet Take 1 tablet by mouth daily.    . naproxen sodium (ALEVE) 220 MG tablet Take 220 mg by mouth daily as needed (pain).    . pantoprazole (PROTONIX) 20 MG tablet Take 1 tablet (20 mg total) by mouth daily before breakfast. 90 tablet 3  . zolpidem (AMBIEN) 5 MG tablet Take 5 mg by mouth at bedtime.      Current Facility-Administered Medications  Medication Dose Route Frequency Provider Last Rate Last Admin  . 0.9 %  sodium  chloride infusion  500 mL Intravenous Once Gatha Mayer, MD        REVIEW OF SYSTEMS:  _0  denotes positive finding, _1  denotes negative finding Cardiac  Comments:  Chest pain or chest pressure:    Shortness of breath upon exertion:    Short of breath when lying flat:    Irregular heart rhythm:        Vascular    Pain in calf, thigh, or hip brought on by ambulation:    Pain in feet at night that wakes you up from your sleep:     Blood clot in your veins:    Leg swelling:         Pulmonary    Oxygen at home:    Productive cough:     Wheezing:         Neurologic    Sudden weakness in arms or legs:     Sudden numbness in arms or legs:     Sudden onset of difficulty speaking or slurred speech:    Temporary loss of vision in one eye:     Problems with dizziness:         Gastrointestinal    Blood in stool:     Vomited blood:         Genitourinary    Burning when urinating:     Blood in urine:        Psychiatric    Major depression:         Hematologic    Bleeding problems:    Problems with blood clotting too easily:        Skin    Rashes or ulcers:        Constitutional    Fever or chills:     PHYSICAL EXAM:   Vitals:   03/13/19 0853  BP: 111/76  Pulse: 80  Resp: 20  Temp: 98 F (36.7 C)  SpO2: 98%  Weight: 202 lb 8 oz (91.9 kg)  Height: 6' (1.829 m)    GENERAL: The patient is a well-nourished male, in no acute distress. The vital signs are documented above. CARDIAC: There is a regular rate and rhythm.  VASCULAR: I do not detect carotid bruits. He has palpable femoral pulses.  Both feet are warm and well-perfused. PULMONARY: There is good air exchange bilaterally without wheezing or rales. ABDOMEN: Soft and non-tender with normal pitched bowel sounds.  He has a small umbilical hernia.  I cannot palpate his small abdominal aortic aneurysm. MUSCULOSKELETAL: There are no major deformities or  cyanosis. NEUROLOGIC: No focal weakness or paresthesias are  detected. SKIN: There are no ulcers or rashes noted. PSYCHIATRIC: The patient has a normal affect.  DATA:    DUPLEX ABDOMINAL AORTA: I have independently interpreted his duplex of his abdominal aorta today.  The maximum diameter of his infrarenal aorta is 3.4 cm.  The iliac arteries are not dilated.  ARTERIAL DOPPLER STUDY: I have independently interpreted his arterial Doppler study today.  On the right side there is a triphasic dorsalis pedis and posterior tibial signal.  ABI is 100%.  Toe pressure 72 mmHg.  On the left side there is a triphasic dorsalis pedis and posterior tibial signal.  ABI is 100%.  Toe pressure is 41 mmHg.   MEDICAL ISSUES:   ABDOMINAL AORTIC ANEURYSM: This patient has a small, 3.4 cm, infrarenal abdominal aortic aneurysm.  He understands we would not consider elective repair unless this reached 5.5 cm.  I have recommended a follow-up ultrasound in 2 years I have ordered that study.  Fortunately he is not a smoker.  I think is very unlikely that this aneurysm will ever enlarge enough that we have to address this.  PERIPHERAL VASCULAR DISEASE: This patient underwent endarterectomy of the right superficial femoral artery and common femoral artery with vein patch.  He is asymptomatic.  He has normal ABIs.  I have encouraged him to stay as active as possible.  I will plan on seeing him back in 2 years.  We will not do any noninvasive studies on his lower extremities as these are being done at Dr. Kennon Holter office I believe.   Deitra Mayo [facility] [phone]

## 2019-03-28 ENCOUNTER — Other Ambulatory Visit: Payer: Self-pay | Admitting: *Deleted

## 2019-03-28 DIAGNOSIS — I714 Abdominal aortic aneurysm, without rupture, unspecified: Secondary | ICD-10-CM

## 2019-03-28 DIAGNOSIS — I739 Peripheral vascular disease, unspecified: Secondary | ICD-10-CM

## 2019-03-30 ENCOUNTER — Ambulatory Visit: Payer: Medicare Other | Attending: Internal Medicine

## 2019-03-30 DIAGNOSIS — Z23 Encounter for immunization: Secondary | ICD-10-CM | POA: Insufficient documentation

## 2019-03-30 NOTE — Progress Notes (Signed)
   Covid-19 Vaccination Clinic  Name:  CHERYL PEFLEY    MRN: [id number] DOB: [birth year]  03/30/2019  Mr. Mullarkey was observed post Covid-19 immunization for 15 minutes without incidence. He was provided with Vaccine Information Sheet and instruction to access the V-Safe system.   Mr. Prinz was instructed to call 911 with any severe reactions post vaccine: Marland Kitchen Difficulty breathing  . Swelling of your face and throat  . A fast heartbeat  . A bad rash all over your body  . Dizziness and weakness    Immunizations Administered    Name Date Dose VIS Date Route   Pfizer COVID-19 Vaccine 03/30/2019  1:27 PM 0.3 mL 03/01/2019 Intramuscular   Manufacturer: [employer], [employer]   Lot: [id number]   [place]: [id number]

## 2019-04-07 ENCOUNTER — Ambulatory Visit: Payer: Medicare HMO

## 2019-04-20 ENCOUNTER — Ambulatory Visit: Payer: Medicare HMO | Attending: Internal Medicine

## 2019-04-20 DIAGNOSIS — Z23 Encounter for immunization: Secondary | ICD-10-CM

## 2019-04-20 NOTE — Progress Notes (Signed)
   Covid-19 Vaccination Clinic  Name:  Manuel Friedman    MRN: [id number] DOB: [birth year]  04/20/2019  [name] was observed post Covid-19 immunization for 15 minutes without incidence. He was provided with Vaccine Information Sheet and instruction to access the V-Safe system.   [name] was instructed to call 911 with any severe reactions post vaccine: Marland Kitchen Difficulty breathing  . Swelling of your face and throat  . A fast heartbeat  . A bad rash all over your body  . Dizziness and weakness    Immunizations Administered    Name Date Dose VIS Date Route   Pfizer COVID-19 Vaccine 04/20/2019  9:22 AM 0.3 mL 03/01/2019 Intramuscular   Manufacturer: [place]   Lot: [id number]   [place]: [id number]

## 2019-05-02 DIAGNOSIS — D485 Neoplasm of uncertain behavior of skin: Secondary | ICD-10-CM | POA: Diagnosis not present

## 2019-05-02 DIAGNOSIS — L570 Actinic keratosis: Secondary | ICD-10-CM | POA: Diagnosis not present

## 2019-05-02 DIAGNOSIS — Z85828 Personal history of other malignant neoplasm of skin: Secondary | ICD-10-CM | POA: Diagnosis not present

## 2019-05-02 DIAGNOSIS — D692 Other nonthrombocytopenic purpura: Secondary | ICD-10-CM | POA: Diagnosis not present

## 2019-06-19 DIAGNOSIS — Z8616 Personal history of COVID-19: Secondary | ICD-10-CM | POA: Diagnosis not present

## 2019-06-19 DIAGNOSIS — F329 Major depressive disorder, single episode, unspecified: Secondary | ICD-10-CM | POA: Diagnosis not present

## 2019-06-19 DIAGNOSIS — I714 Abdominal aortic aneurysm, without rupture: Secondary | ICD-10-CM | POA: Diagnosis not present

## 2019-06-19 DIAGNOSIS — N401 Enlarged prostate with lower urinary tract symptoms: Secondary | ICD-10-CM | POA: Diagnosis not present

## 2019-06-19 DIAGNOSIS — E785 Hyperlipidemia, unspecified: Secondary | ICD-10-CM | POA: Diagnosis not present

## 2019-06-19 DIAGNOSIS — I2510 Atherosclerotic heart disease of native coronary artery without angina pectoris: Secondary | ICD-10-CM | POA: Diagnosis not present

## 2019-06-19 DIAGNOSIS — I700 Atherosclerosis of aorta: Secondary | ICD-10-CM | POA: Diagnosis not present

## 2019-06-19 DIAGNOSIS — J449 Chronic obstructive pulmonary disease, unspecified: Secondary | ICD-10-CM | POA: Diagnosis not present

## 2019-06-19 DIAGNOSIS — I739 Peripheral vascular disease, unspecified: Secondary | ICD-10-CM | POA: Diagnosis not present

## 2019-08-15 DIAGNOSIS — D692 Other nonthrombocytopenic purpura: Secondary | ICD-10-CM | POA: Diagnosis not present

## 2019-08-15 DIAGNOSIS — L309 Dermatitis, unspecified: Secondary | ICD-10-CM | POA: Diagnosis not present

## 2019-08-15 DIAGNOSIS — Z85828 Personal history of other malignant neoplasm of skin: Secondary | ICD-10-CM | POA: Diagnosis not present

## 2019-09-05 ENCOUNTER — Inpatient Hospital Stay (HOSPITAL_COMMUNITY): Admission: RE | Admit: 2019-09-05 | Payer: Medicare HMO | Source: Ambulatory Visit

## 2019-09-26 ENCOUNTER — Other Ambulatory Visit: Payer: Self-pay | Admitting: Internal Medicine

## 2019-09-26 DIAGNOSIS — N644 Mastodynia: Secondary | ICD-10-CM

## 2019-10-14 ENCOUNTER — Other Ambulatory Visit: Payer: Self-pay

## 2019-10-14 ENCOUNTER — Ambulatory Visit: Payer: Medicare HMO

## 2019-10-14 ENCOUNTER — Ambulatory Visit
Admission: RE | Admit: 2019-10-14 | Discharge: 2019-10-14 | Disposition: A | Discharge status: Home / Self Care | Payer: Medicare HMO | Source: Ambulatory Visit | Attending: Internal Medicine | Admitting: Internal Medicine

## 2019-10-14 DIAGNOSIS — N644 Mastodynia: Secondary | ICD-10-CM

## 2019-10-14 DIAGNOSIS — R928 Other abnormal and inconclusive findings on diagnostic imaging of breast: Secondary | ICD-10-CM | POA: Diagnosis not present

## 2019-12-25 DIAGNOSIS — E291 Testicular hypofunction: Secondary | ICD-10-CM | POA: Diagnosis not present

## 2019-12-25 DIAGNOSIS — E538 Deficiency of other specified B group vitamins: Secondary | ICD-10-CM | POA: Diagnosis not present

## 2019-12-25 DIAGNOSIS — I10 Essential (primary) hypertension: Secondary | ICD-10-CM | POA: Diagnosis not present

## 2019-12-25 DIAGNOSIS — Z125 Encounter for screening for malignant neoplasm of prostate: Secondary | ICD-10-CM | POA: Diagnosis not present

## 2019-12-25 DIAGNOSIS — R7301 Impaired fasting glucose: Secondary | ICD-10-CM | POA: Diagnosis not present

## 2019-12-25 DIAGNOSIS — E785 Hyperlipidemia, unspecified: Secondary | ICD-10-CM | POA: Diagnosis not present

## 2019-12-25 DIAGNOSIS — Z0000 Encounter for general adult medical examination without abnormal findings: Secondary | ICD-10-CM | POA: Diagnosis not present

## 2019-12-26 DIAGNOSIS — R82998 Other abnormal findings in urine: Secondary | ICD-10-CM | POA: Diagnosis not present

## 2019-12-28 DIAGNOSIS — Z23 Encounter for immunization: Secondary | ICD-10-CM | POA: Diagnosis not present

## 2020-01-01 DIAGNOSIS — H6121 Impacted cerumen, right ear: Secondary | ICD-10-CM | POA: Diagnosis not present

## 2020-01-01 DIAGNOSIS — D126 Benign neoplasm of colon, unspecified: Secondary | ICD-10-CM | POA: Diagnosis not present

## 2020-01-01 DIAGNOSIS — Z0000 Encounter for general adult medical examination without abnormal findings: Secondary | ICD-10-CM | POA: Diagnosis not present

## 2020-01-01 DIAGNOSIS — I739 Peripheral vascular disease, unspecified: Secondary | ICD-10-CM | POA: Diagnosis not present

## 2020-01-01 DIAGNOSIS — J449 Chronic obstructive pulmonary disease, unspecified: Secondary | ICD-10-CM | POA: Diagnosis not present

## 2020-01-01 DIAGNOSIS — I2510 Atherosclerotic heart disease of native coronary artery without angina pectoris: Secondary | ICD-10-CM | POA: Diagnosis not present

## 2020-01-01 DIAGNOSIS — I10 Essential (primary) hypertension: Secondary | ICD-10-CM | POA: Diagnosis not present

## 2020-01-01 DIAGNOSIS — M1990 Unspecified osteoarthritis, unspecified site: Secondary | ICD-10-CM | POA: Diagnosis not present

## 2020-01-01 DIAGNOSIS — I700 Atherosclerosis of aorta: Secondary | ICD-10-CM | POA: Diagnosis not present

## 2020-01-06 ENCOUNTER — Other Ambulatory Visit: Payer: Self-pay | Admitting: Internal Medicine

## 2020-02-03 DIAGNOSIS — H9113 Presbycusis, bilateral: Secondary | ICD-10-CM | POA: Diagnosis not present

## 2020-02-03 DIAGNOSIS — Z974 Presence of external hearing-aid: Secondary | ICD-10-CM | POA: Diagnosis not present

## 2020-02-03 DIAGNOSIS — R040 Epistaxis: Secondary | ICD-10-CM | POA: Diagnosis not present

## 2020-02-03 DIAGNOSIS — H6123 Impacted cerumen, bilateral: Secondary | ICD-10-CM | POA: Diagnosis not present

## 2020-03-10 DIAGNOSIS — Z85828 Personal history of other malignant neoplasm of skin: Secondary | ICD-10-CM | POA: Diagnosis not present

## 2020-03-10 DIAGNOSIS — L928 Other granulomatous disorders of the skin and subcutaneous tissue: Secondary | ICD-10-CM | POA: Diagnosis not present

## 2020-03-10 DIAGNOSIS — L923 Foreign body granuloma of the skin and subcutaneous tissue: Secondary | ICD-10-CM | POA: Diagnosis not present

## 2020-03-10 DIAGNOSIS — D485 Neoplasm of uncertain behavior of skin: Secondary | ICD-10-CM | POA: Diagnosis not present

## 2020-03-27 DIAGNOSIS — H43813 Vitreous degeneration, bilateral: Secondary | ICD-10-CM | POA: Diagnosis not present

## 2020-03-27 DIAGNOSIS — D3131 Benign neoplasm of right choroid: Secondary | ICD-10-CM | POA: Diagnosis not present

## 2020-03-27 DIAGNOSIS — Z961 Presence of intraocular lens: Secondary | ICD-10-CM | POA: Diagnosis not present

## 2020-03-27 DIAGNOSIS — H5213 Myopia, bilateral: Secondary | ICD-10-CM | POA: Diagnosis not present

## 2020-04-08 ENCOUNTER — Other Ambulatory Visit: Payer: Self-pay | Admitting: Internal Medicine

## 2020-04-18 DIAGNOSIS — N401 Enlarged prostate with lower urinary tract symptoms: Secondary | ICD-10-CM | POA: Diagnosis not present

## 2020-04-18 DIAGNOSIS — G479 Sleep disorder, unspecified: Secondary | ICD-10-CM | POA: Diagnosis not present

## 2020-04-18 DIAGNOSIS — D126 Benign neoplasm of colon, unspecified: Secondary | ICD-10-CM | POA: Diagnosis not present

## 2020-04-18 DIAGNOSIS — F329 Major depressive disorder, single episode, unspecified: Secondary | ICD-10-CM | POA: Diagnosis not present

## 2020-04-18 DIAGNOSIS — J302 Other seasonal allergic rhinitis: Secondary | ICD-10-CM | POA: Diagnosis not present

## 2020-04-18 DIAGNOSIS — J449 Chronic obstructive pulmonary disease, unspecified: Secondary | ICD-10-CM | POA: Diagnosis not present

## 2020-04-18 DIAGNOSIS — E785 Hyperlipidemia, unspecified: Secondary | ICD-10-CM | POA: Diagnosis not present

## 2020-04-18 DIAGNOSIS — I10 Essential (primary) hypertension: Secondary | ICD-10-CM | POA: Diagnosis not present

## 2020-04-18 DIAGNOSIS — G3184 Mild cognitive impairment, so stated: Secondary | ICD-10-CM | POA: Diagnosis not present

## 2020-05-18 DIAGNOSIS — I700 Atherosclerosis of aorta: Secondary | ICD-10-CM | POA: Diagnosis not present

## 2020-05-18 DIAGNOSIS — E785 Hyperlipidemia, unspecified: Secondary | ICD-10-CM | POA: Diagnosis not present

## 2020-05-18 DIAGNOSIS — I10 Essential (primary) hypertension: Secondary | ICD-10-CM | POA: Diagnosis not present

## 2020-05-18 DIAGNOSIS — I2510 Atherosclerotic heart disease of native coronary artery without angina pectoris: Secondary | ICD-10-CM | POA: Diagnosis not present

## 2020-06-23 DIAGNOSIS — U071 COVID-19: Secondary | ICD-10-CM | POA: Diagnosis not present

## 2020-06-23 DIAGNOSIS — Z23 Encounter for immunization: Secondary | ICD-10-CM | POA: Diagnosis not present

## 2020-07-09 ENCOUNTER — Other Ambulatory Visit: Payer: Self-pay | Admitting: Internal Medicine

## 2020-07-12 IMAGING — DX PORTABLE CHEST - 1 VIEW
1 series · 1 of 1 positions shown · non-contrast
Comparison: March 22, 2017

CLINICAL DATA: Shortness of breath

EXAM:
PORTABLE CHEST 1 VIEW

[chest ap]
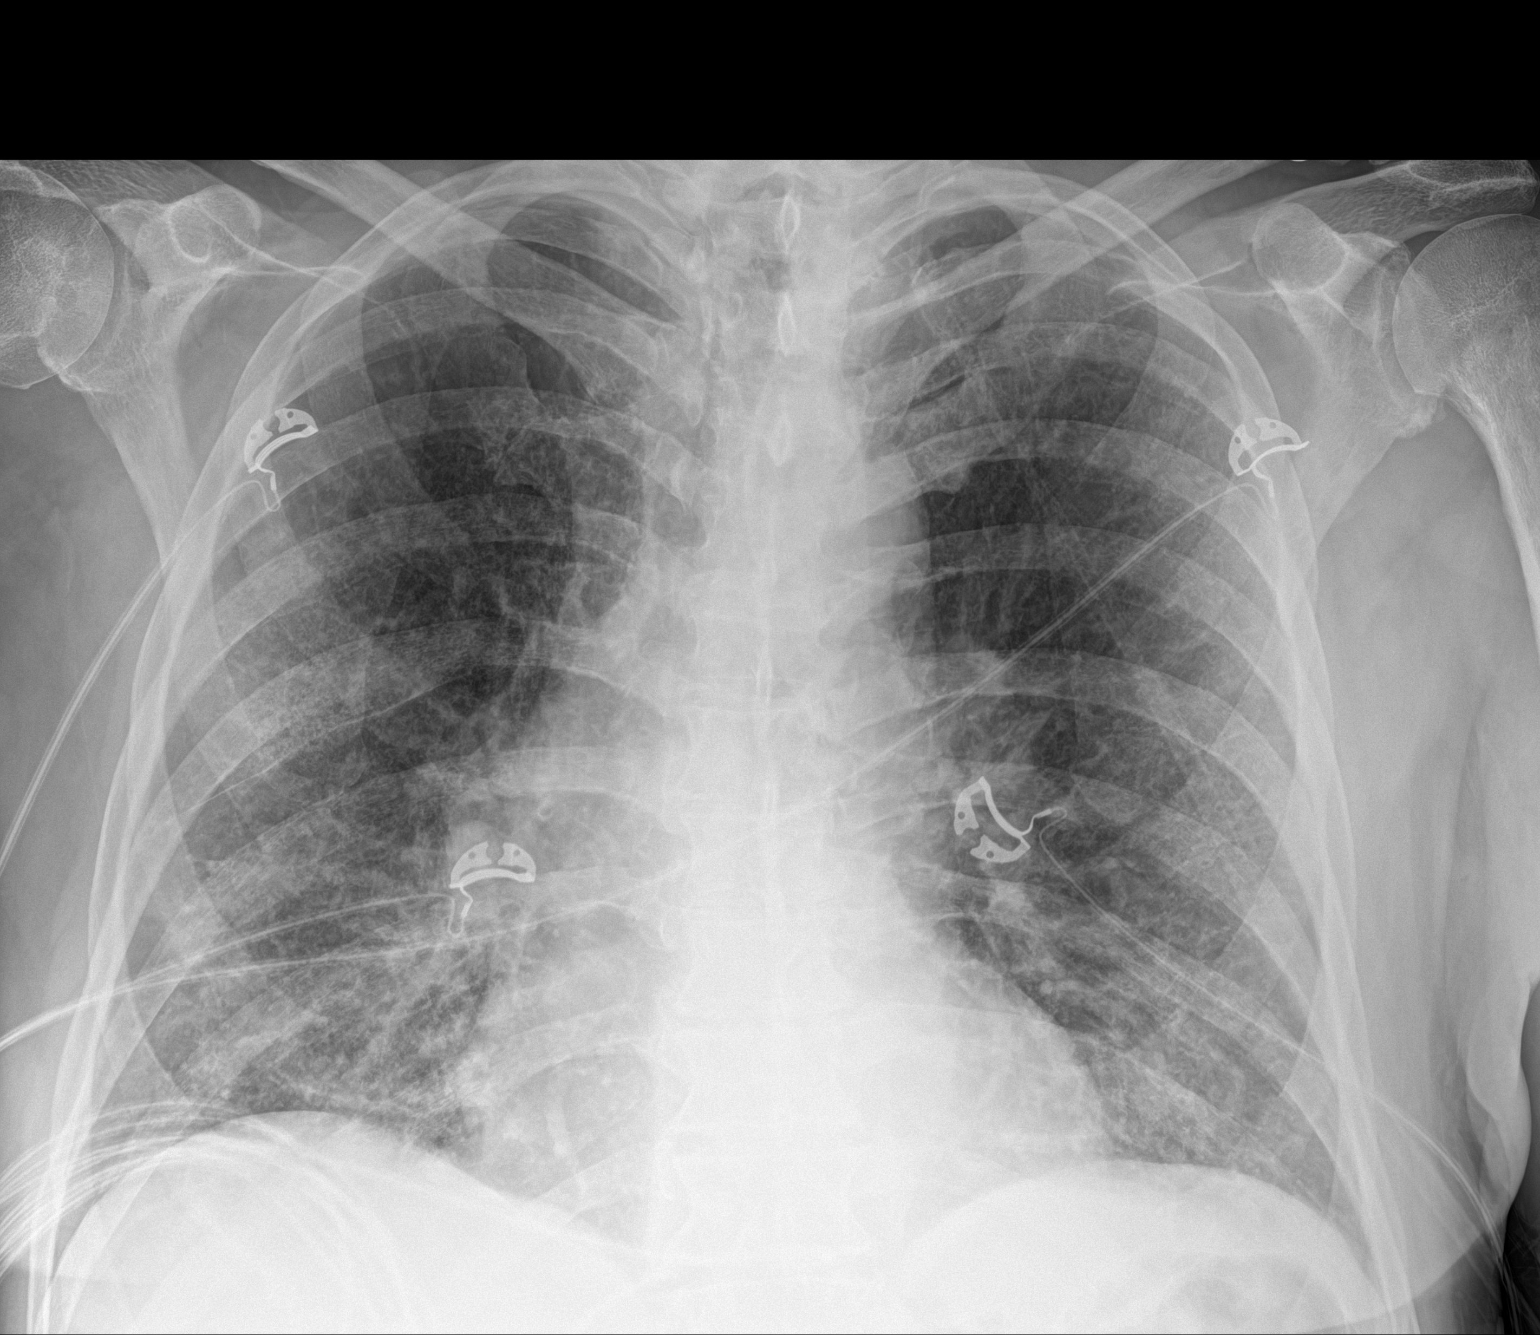

[1 of 1 positions shown; findings below may reference images not displayed]

FINDINGS: There is mild cardiomegaly with pulmonary vascularity within normal
limits. There is diffuse interstitial prominence, likely edema. No
consolidation. No pleural effusion. No adenopathy. No bone lesions.
IMPRESSION: Mild cardiomegaly with diffuse interstitial prominence, likely
interstitial edema. Suspect a degree of congestive heart failure.
Atypical organism pneumonia throughout the lungs bilaterally could
present in this manner and is a differential consideration. No
evident adenopathy.

## 2020-07-15 ENCOUNTER — Ambulatory Visit (INDEPENDENT_AMBULATORY_CARE_PROVIDER_SITE_OTHER): Payer: Medicare HMO

## 2020-07-15 ENCOUNTER — Ambulatory Visit: Payer: Medicare HMO | Admitting: Orthopaedic Surgery

## 2020-07-15 DIAGNOSIS — M25512 Pain in left shoulder: Secondary | ICD-10-CM

## 2020-07-15 DIAGNOSIS — G8929 Other chronic pain: Secondary | ICD-10-CM

## 2020-07-15 MED ORDER — LIDOCAINE HCL 1 % IJ SOLN
3.0000 mL | INTRAMUSCULAR | Status: AC | PRN
  Administered 2020-07-15: 3 mL

## 2020-07-15 MED ORDER — METHYLPREDNISOLONE ACETATE 40 MG/ML IJ SUSP
40.0000 mg | INTRAMUSCULAR | Status: AC | PRN
  Administered 2020-07-15: 40 mg via INTRA_ARTICULAR

## 2020-07-15 NOTE — Progress Notes (Signed)
Office Visit Note   Patient: Manuel Friedman           Date of Birth: 1941-01-23           MRN: [id number] Visit Date: 07/15/2020              Requested by: [name], MD [address] [place],  [place] [address] PCP: [name], MD   Assessment & Plan: Visit Diagnoses:  1. Chronic left shoulder pain     Plan: Given his age there certainly could be some tearing of the rotator cuff.  We are going to try to stay with conservative treatment.  I showed him shoulder strengthening exercises to try and per his request I did provide a steroid injection in the left shoulder subacromial space.  We can always repeat this again in 3 months if needed.  All questions and concerns were answered and addressed.  Follow-Up Instructions: Return if symptoms worsen or fail to improve.   Orders:  Orders Placed This Encounter  Procedures  . Large Joint Inj  . XR Shoulder Left   No orders of the defined types were placed in this encounter.     Procedures: Large Joint Inj: L subacromial bursa on 07/15/2020 10:22 AM Indications: pain and diagnostic evaluation Details: 22 G 1.5 in needle  Arthrogram: No  Medications: 3 mL lidocaine 1 %; 40 mg methylPREDNISolone acetate 40 MG/ML Outcome: tolerated well, no immediate complications Procedure, treatment alternatives, risks and benefits explained, specific risks discussed. Consent was given by the patient. Immediately prior to procedure a time out was called to verify the correct patient, procedure, equipment, support staff and site/side marked as required. Patient was prepped and draped in the usual sterile fashion.       Clinical Data: No additional findings.   Subjective: Chief Complaint  Patient presents with  . Left Shoulder - Pain  The patient is an active 80 year old left-hand-dominant gentleman who has been having left shoulder pain with overhead activities and reaching behind him for some time now.  He reports decreased strength  and decreased motion as well as a weak and achy feeling of the shoulder.  He is requesting steroid injection.  He does feel like he had overdone working out with [employer] type of equipment.  He is now diabetic.  He has not had surgery on that left shoulder before.  He denies any neck pain and denies any numbness and tingling in the left upper extremity.  HPI  Review of Systems He currently denies any headache, chest pain, shortness of breath, fever, chills, nausea, vomiting  Objective: Vital Signs: There were no vitals taken for this visit.  Physical Exam He is alert and orient x3 and in no acute distress Ortho Exam Examination of his left shoulder shows is clinically well located.  He does have full range of motion of the shoulder and no significant weakness of the rotator cuff but there is definitely some grinding in the subacromial outlet and some pain at [facility] joint. Specialty Comments:  No specialty comments available.  Imaging: XR Shoulder Left  Result Date: 07/15/2020 3 views the left shoulder show no acute findings.  The shoulder is well located.    PMFS History: Patient Active Problem List   Diagnosis Date Noted  . Elevated liver enzymes 11/09/2018  . Generalized weakness 11/06/2018  . PAD (peripheral artery disease) ([place]) 04/06/2017  . Rosacea   . Internal and external hemorrhoids without complication   . Hyperlipemia   .  HTN (hypertension)   . Hiatal hernia   . Esophageal stricture   . Diverticulosis   . Depression   . Decreased hearing   . COPD (chronic obstructive pulmonary disease) ([place])   . Arthritis   . Peripheral arterial disease ([place]) 02/03/2017  . Abdominal aortic aneurysm ([place]) 07/04/2014  . Personal history of colonic polyps 03/27/2012  . Cerebrovascular disease 09/26/2011  . CAD (coronary artery disease) 04/18/2011  . Lung nodule 04/18/2011  . Chest pain 03/07/2011  . Hypertension 03/07/2011  . Hyperlipidemia 03/07/2011   Past Medical History:   Diagnosis Date  . Adenomatous colon polyp   . Arthritis   . CAD (coronary artery disease)    s/p cath 03/2011 with total RCA, left to right collaterals and mild nonobstructive disease in the LAD and LCX. Trying medical management but could attempt PCI of the RCA  . Cancer ([place])    skin cancer  . Cataract    Bil/had surgery  . COPD (chronic obstructive pulmonary disease) ([place])   . COVID-19 virus infection 11/06/2018  . Decreased hearing    wears hearing aid/Bil  . Depression   . Diverticulosis   . Esophageal stricture   . Hiatal hernia   . HTN (hypertension)   . Hyperlipemia   . Internal and external hemorrhoids without complication   . Lung nodule    Noted on CTA Jan 2013. Needs follow upstudy  . Peripheral vascular disease ([place])   . Rosacea   . Swelling of ankle, right   . Umbilical hernia    hx    Family History  Problem Relation Age of Onset  . Pancreatic cancer Mother   . Multiple myeloma Father   . Colon cancer Neg Hx   . Stomach cancer Neg Hx   . Esophageal cancer Neg Hx   . Rectal cancer Neg Hx     Past Surgical History:  Procedure Laterality Date  . CATARACT EXTRACTION, BILATERAL    . COLONOSCOPY W/ BIOPSIES AND POLYPECTOMY  01/30/2007   diverticulosis, internal and external hemorrhoids  . ENDARTERECTOMY FEMORAL Right 04/06/2017   Procedure: ENDARTERECTOMY RIGHT COMMON FEMORAL ARTERY;  Surgeon: Angelia Mould, MD;  Location: [facility];  Service: Vascular;  Laterality: Right;  . LOWER EXTREMITY ANGIOGRAPHY N/A 03/27/2017   Procedure: LOWER EXTREMITY ANGIOGRAPHY;  Surgeon: Lorretta Harp, MD;  Location: [facility];  Service: Cardiovascular;  Laterality: N/A;  . PATCH ANGIOPLASTY Right 04/06/2017   Procedure: PATCH ANGIOPLASTY USING RIGHT GREATER SAPHENOUS VEIN ON RIGHT COMMON FEMORAL ARTERY;  Surgeon: Angelia Mould, MD;  Location: [facility];  Service: Vascular;  Laterality: Right;  . TONSILLECTOMY    . UPPER GASTROINTESTINAL ENDOSCOPY  02/19/2009    stricture, GERD, Hiatal hernia   Social History   Occupational History  . Not on file  Tobacco Use  . Smoking status: Former Smoker    Quit date: 02/29/1992    Years since quitting: 28.3  . Smokeless tobacco: Never Used  Vaping Use  . Vaping Use: Never used  Substance and Sexual Activity  . Alcohol use: Yes    Alcohol/week: 7.0 standard drinks    Types: 7 Standard drinks or equivalent per week  . Drug use: No  . Sexual activity: Yes

## 2020-07-16 IMAGING — US ULTRASOUND ABDOMEN LIMITED
1 series · 14 of 25 positions shown · non-contrast
Comparison: None.

CLINICAL DATA: Elevated LFTs.

EXAM:
ULTRASOUND ABDOMEN LIMITED RIGHT UPPER QUADRANT

[Series 1: ultrasound abdomen limited · 0.22mm/px · 14 of 58 slices shown]
[im 1/58]
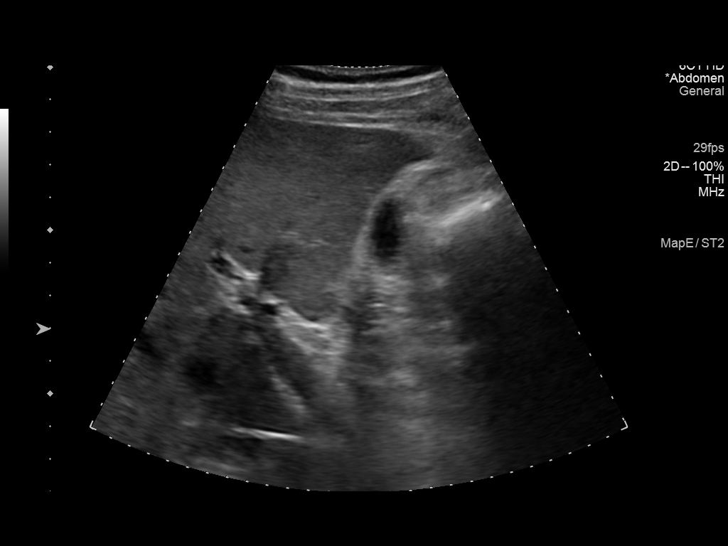
[im 5/58]
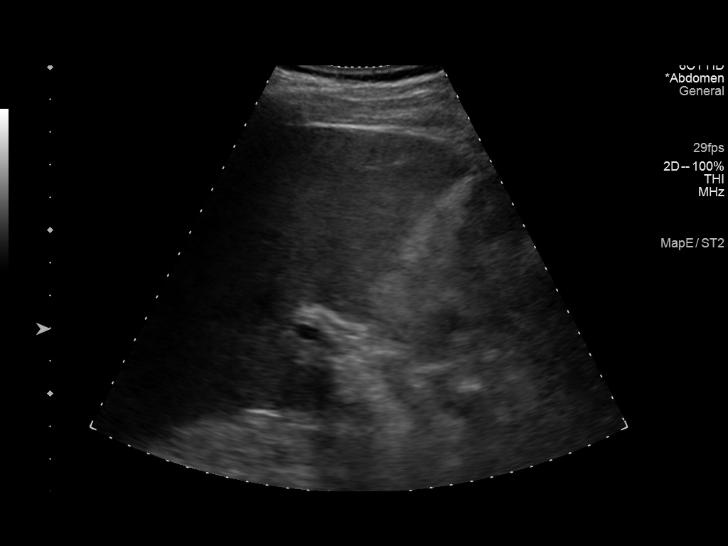
[im 10/58]
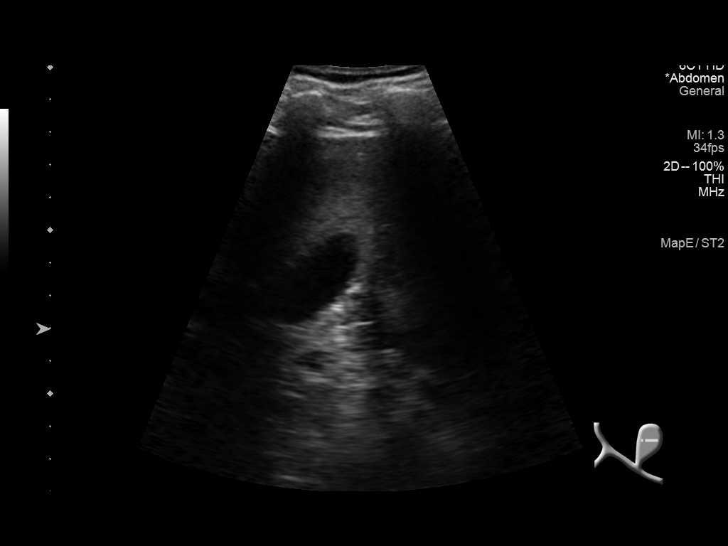
[im 15/58]
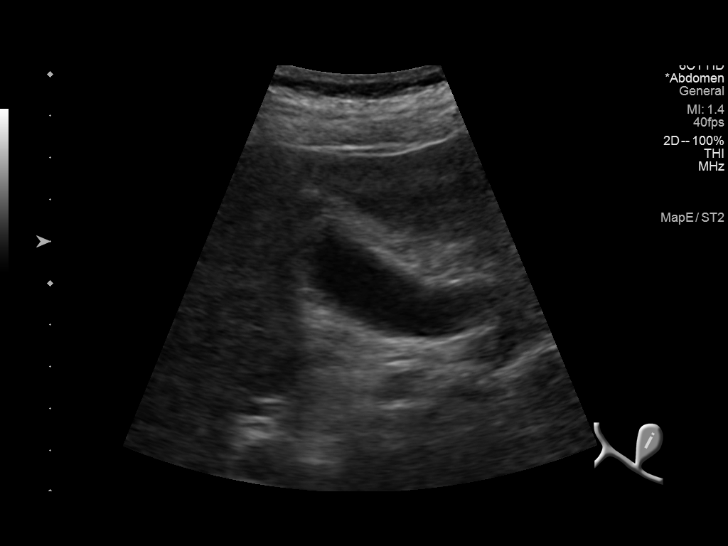
[im 20/58]
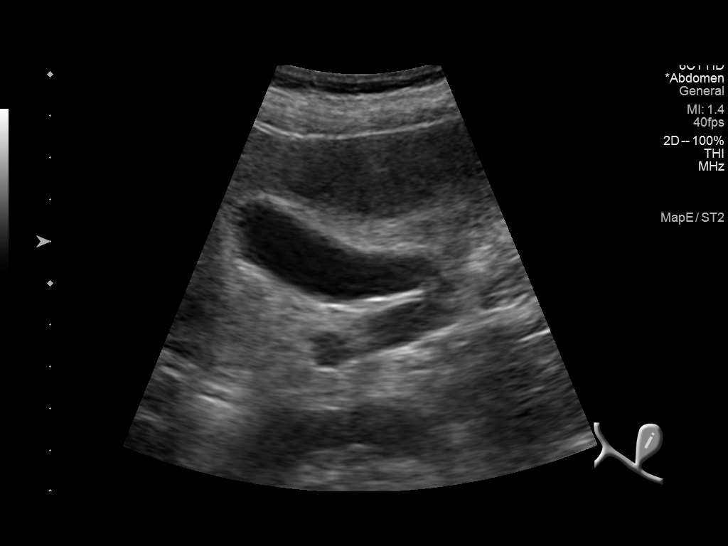
[im 22/58]
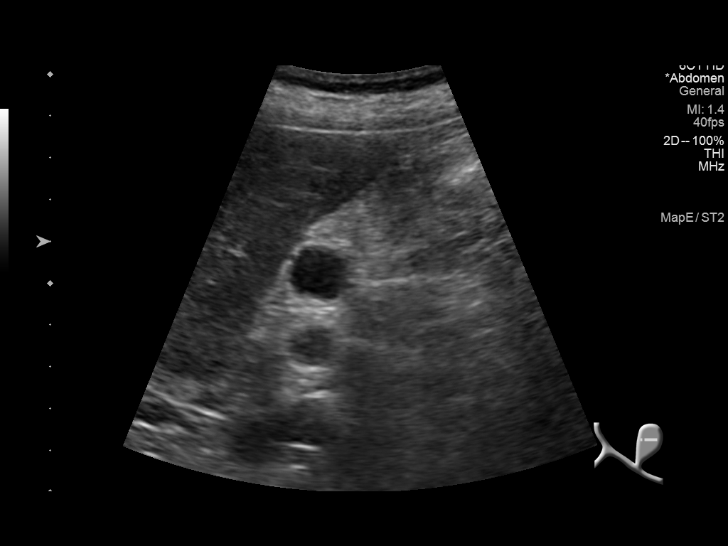
[im 27/58]
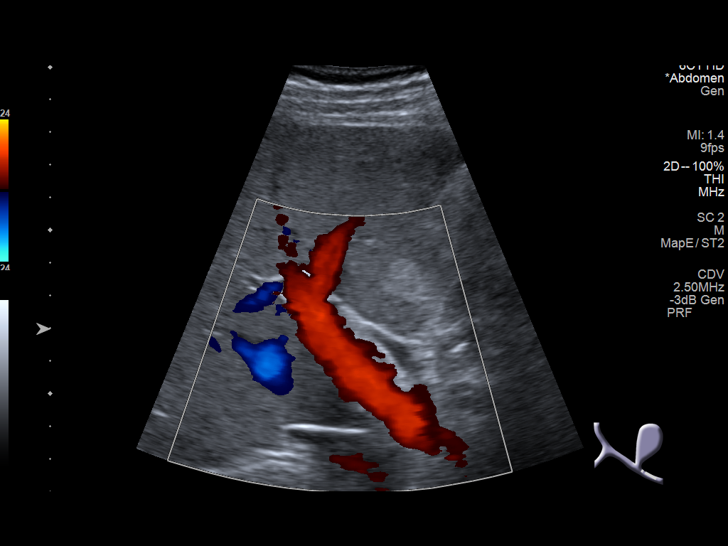
[im 31/58]
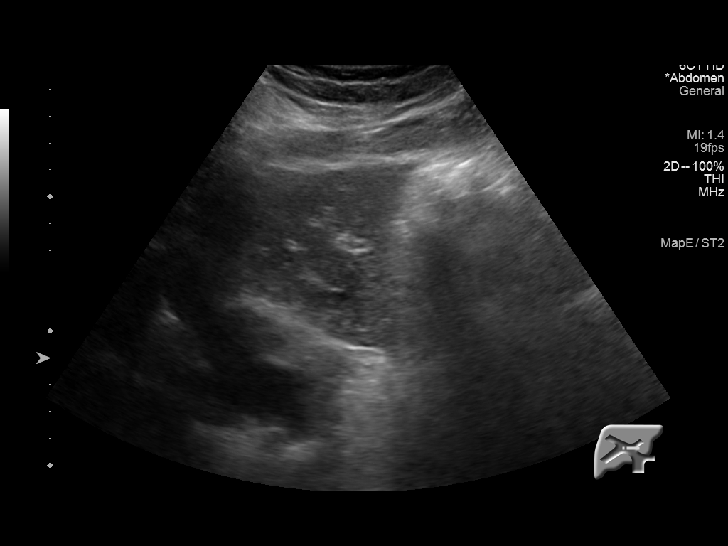
[im 36/58]
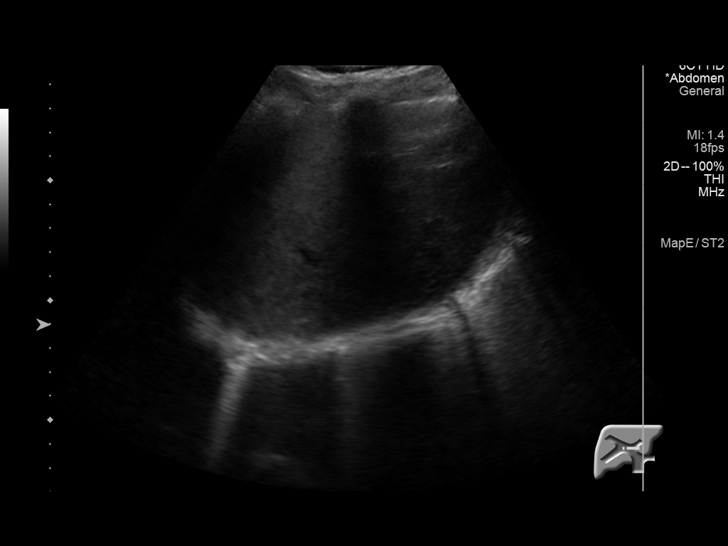
[im 39/58]
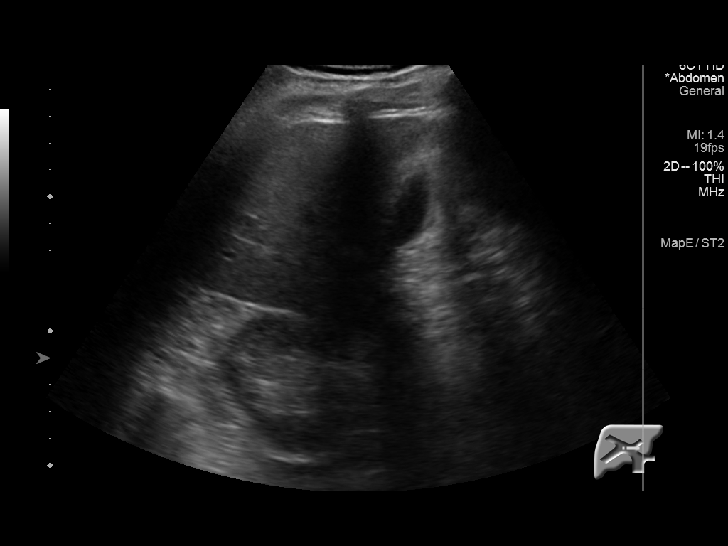
[im 43/58]
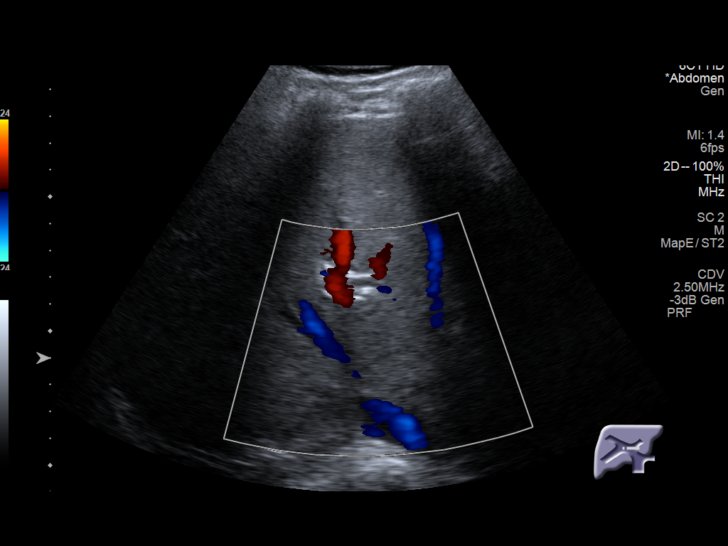
[im 48/58]
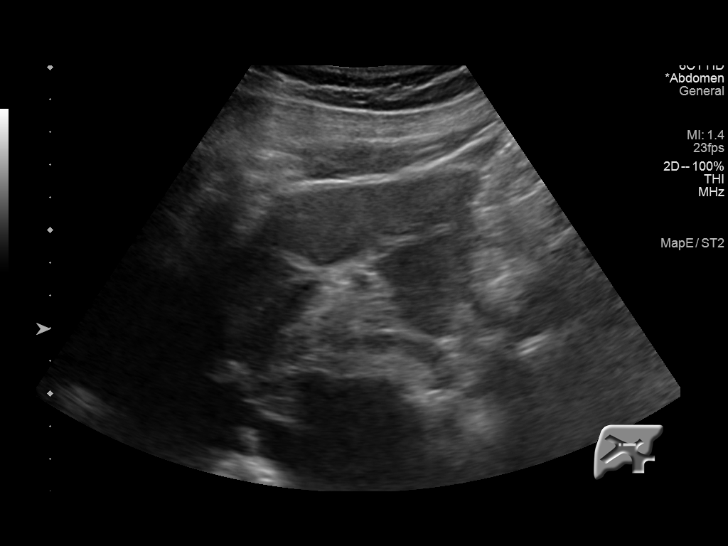
[im 53/58]
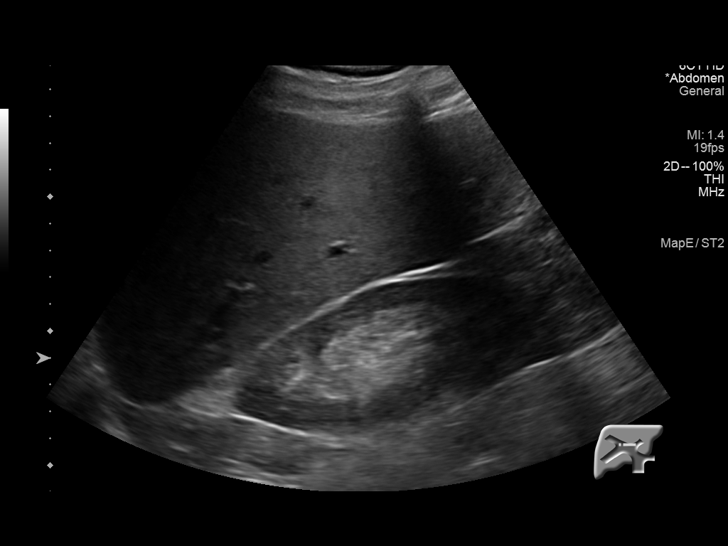
[im 58/58]
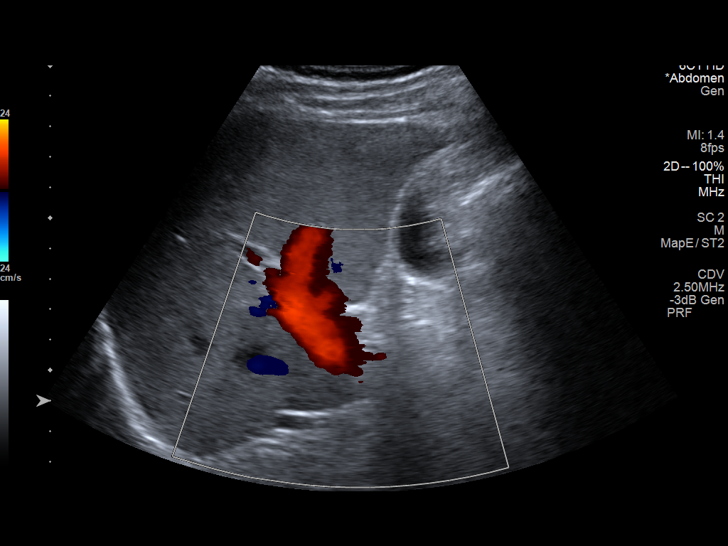

[14 of 25 positions shown; findings below may reference images not displayed]

FINDINGS: Gallbladder:

Gallbladder incompletely distended with upper normal gallbladder
wall thickness. No evidence for gallstones. No pericholecystic
fluid.

Common bile duct:

Diameter: 4 mm

Liver:

No focal lesion identified. Within normal limits in parenchymal
echogenicity. Portal vein is patent on color Doppler imaging with
normal direction of blood flow towards the liver.

Other: None.
IMPRESSION: No sonographic findings to explain the patient's elevated LFTs.

## 2020-08-04 DIAGNOSIS — L821 Other seborrheic keratosis: Secondary | ICD-10-CM | POA: Diagnosis not present

## 2020-08-04 DIAGNOSIS — Z85828 Personal history of other malignant neoplasm of skin: Secondary | ICD-10-CM | POA: Diagnosis not present

## 2020-08-04 DIAGNOSIS — I788 Other diseases of capillaries: Secondary | ICD-10-CM | POA: Diagnosis not present

## 2020-08-04 DIAGNOSIS — L723 Sebaceous cyst: Secondary | ICD-10-CM | POA: Diagnosis not present

## 2020-08-04 DIAGNOSIS — D692 Other nonthrombocytopenic purpura: Secondary | ICD-10-CM | POA: Diagnosis not present

## 2020-08-18 DIAGNOSIS — I2510 Atherosclerotic heart disease of native coronary artery without angina pectoris: Secondary | ICD-10-CM | POA: Diagnosis not present

## 2020-08-18 DIAGNOSIS — E785 Hyperlipidemia, unspecified: Secondary | ICD-10-CM | POA: Diagnosis not present

## 2020-08-18 DIAGNOSIS — I10 Essential (primary) hypertension: Secondary | ICD-10-CM | POA: Diagnosis not present

## 2020-08-18 DIAGNOSIS — I700 Atherosclerosis of aorta: Secondary | ICD-10-CM | POA: Diagnosis not present

## 2020-09-16 ENCOUNTER — Ambulatory Visit: Payer: Medicare HMO | Admitting: Podiatry

## 2020-09-16 ENCOUNTER — Other Ambulatory Visit: Payer: Self-pay

## 2020-09-16 ENCOUNTER — Encounter: Payer: Self-pay | Admitting: Podiatry

## 2020-09-16 DIAGNOSIS — B351 Tinea unguium: Secondary | ICD-10-CM

## 2020-09-16 DIAGNOSIS — L600 Ingrowing nail: Secondary | ICD-10-CM

## 2020-09-16 NOTE — Progress Notes (Signed)
Subjective:   Patient ID: Manuel Friedman, male   DOB: 80 y.o.   MRN: [id number]   HPI Patient has concerns about discoloration of his left big toenail x20 years and has developed an ingrown toenail and has circulatory issues and wants to have that checked.  Patient does not smoke likes to be active and states been getting some soreness of this big toe   Review of Systems  All other systems reviewed and are negative.      Objective:  Physical Exam Vitals and nursing note reviewed.  Constitutional:      Appearance: He is well-developed.  Pulmonary:     Effort: Pulmonary effort is normal.  Musculoskeletal:        General: Normal range of motion.  Skin:    General: Skin is warm.  Neurological:     Mental Status: He is alert.    Neurovascular status intact muscle strength adequate range of motion adequate with patient noted to have thickness of the left hallux nail with dystrophic changes incurvation of the lateral border moderate pain when pressed.  Patient is found to have good digital perfusion well oriented x3     Assessment:  Damaged left hallux nail with mycotic component with trauma along with ingrown component left big toe     Plan:  H&P reviewed conditions and at this point for the left I have recommended no current treatment after extensive review of circulatory status ingrown component.  This can be fixed but I do not feel comfortable at this point and debrided out nailbed and instructed on daily soaks.  Patient will be seen back as needed may require other treatment at 1 point in the future and do not recommend treatment of any fungal infection present

## 2020-09-16 NOTE — Patient Instructions (Signed)

## 2020-09-17 DIAGNOSIS — I2510 Atherosclerotic heart disease of native coronary artery without angina pectoris: Secondary | ICD-10-CM | POA: Diagnosis not present

## 2020-09-17 DIAGNOSIS — I10 Essential (primary) hypertension: Secondary | ICD-10-CM | POA: Diagnosis not present

## 2020-09-17 DIAGNOSIS — I700 Atherosclerosis of aorta: Secondary | ICD-10-CM | POA: Diagnosis not present

## 2020-09-17 DIAGNOSIS — E785 Hyperlipidemia, unspecified: Secondary | ICD-10-CM | POA: Diagnosis not present

## 2020-10-14 ENCOUNTER — Other Ambulatory Visit: Payer: Self-pay

## 2020-10-14 ENCOUNTER — Ambulatory Visit: Payer: Medicare HMO | Admitting: Orthopaedic Surgery

## 2020-10-18 DIAGNOSIS — E785 Hyperlipidemia, unspecified: Secondary | ICD-10-CM | POA: Diagnosis not present

## 2020-10-18 DIAGNOSIS — I2510 Atherosclerotic heart disease of native coronary artery without angina pectoris: Secondary | ICD-10-CM | POA: Diagnosis not present

## 2020-10-18 DIAGNOSIS — I700 Atherosclerosis of aorta: Secondary | ICD-10-CM | POA: Diagnosis not present

## 2020-10-18 DIAGNOSIS — I10 Essential (primary) hypertension: Secondary | ICD-10-CM | POA: Diagnosis not present

## 2020-11-02 ENCOUNTER — Ambulatory Visit: Payer: Medicare HMO | Admitting: Orthopaedic Surgery

## 2021-01-07 DIAGNOSIS — R1033 Periumbilical pain: Secondary | ICD-10-CM | POA: Diagnosis not present

## 2021-01-07 DIAGNOSIS — K429 Umbilical hernia without obstruction or gangrene: Secondary | ICD-10-CM | POA: Diagnosis not present

## 2021-01-07 DIAGNOSIS — I10 Essential (primary) hypertension: Secondary | ICD-10-CM | POA: Diagnosis not present

## 2021-01-07 DIAGNOSIS — I2510 Atherosclerotic heart disease of native coronary artery without angina pectoris: Secondary | ICD-10-CM | POA: Diagnosis not present

## 2021-01-08 ENCOUNTER — Other Ambulatory Visit: Payer: Self-pay | Admitting: Internal Medicine

## 2021-01-14 DIAGNOSIS — E538 Deficiency of other specified B group vitamins: Secondary | ICD-10-CM | POA: Diagnosis not present

## 2021-01-14 DIAGNOSIS — E785 Hyperlipidemia, unspecified: Secondary | ICD-10-CM | POA: Diagnosis not present

## 2021-01-14 DIAGNOSIS — I10 Essential (primary) hypertension: Secondary | ICD-10-CM | POA: Diagnosis not present

## 2021-01-14 DIAGNOSIS — Z125 Encounter for screening for malignant neoplasm of prostate: Secondary | ICD-10-CM | POA: Diagnosis not present

## 2021-01-14 DIAGNOSIS — E291 Testicular hypofunction: Secondary | ICD-10-CM | POA: Diagnosis not present

## 2021-01-18 ENCOUNTER — Ambulatory Visit: Payer: Self-pay | Admitting: Surgery

## 2021-01-18 DIAGNOSIS — K429 Umbilical hernia without obstruction or gangrene: Secondary | ICD-10-CM | POA: Diagnosis not present

## 2021-01-18 NOTE — H&P (Signed)
Subjective    Chief Complaint: Umbilical Hernia   PCP - Dr. Crist Friedman   History of Present Illness: Manuel Friedman is a 80 y.o. male who is seen today as an office consultation at the request of Dr. Joylene Friedman for evaluation of Umbilical Hernia .     This is an 80 year old male who presents with at least a 3-year history of an enlarging umbilical hernia.  Previously this area was fairly soft and easily reducible.  Recently has become more symptomatic.  He is having more difficulty reducing this.  He denies any obstructive symptoms.   The patient is on no blood thinners   Review of Systems: A complete review of systems was obtained from the patient.  I have reviewed this information and discussed as appropriate with the patient.  See HPI as well for other ROS.   Review of Systems  Constitutional: Negative.   HENT: Negative.   Eyes: Negative.   Respiratory: Negative.   Cardiovascular: Negative.   Gastrointestinal: Positive for abdominal pain.  Genitourinary: Negative.   Musculoskeletal: Negative.   Skin: Negative.   Neurological: Negative.   Endo/Heme/Allergies: Negative.   Psychiatric/Behavioral: Negative.         Medical History: Past Medical History  History reviewed. No pertinent past medical history.        Patient Active Problem List  Diagnosis   Abdominal aortic aneurysm   Arthritis   CAD (coronary artery disease)   COPD (chronic obstructive pulmonary disease) (CMS-HCC)   Elevated liver enzymes   Essential (primary) hypertension      Past Surgical History  History reviewed. No pertinent surgical history.      Allergies  No Known Allergies           Current Outpatient Medications on File Prior to Visit  Medication Sig Dispense Refill   pantoprazole (PROTONIX) 20 MG DR tablet TAKE 1 TABLET EVERY DAY BEFORE BREAKFAST (NEED MD APPOINTMENT)       atorvastatin (LIPITOR) 80 MG tablet         cetirizine (ZYRTEC) 10 MG tablet Take 10 mg by mouth as needed        coenzyme Q10 10 mg capsule Take 1 tablet by mouth once daily       escitalopram oxalate (LEXAPRO) 20 MG tablet         magnesium oxide (MAG-OX) 400 mg (241.3 mg magnesium) tablet Take by mouth       multivitamin with minerals tablet Take 1 tablet by mouth once daily       mupirocin (BACTROBAN) 2 % ointment 1 APPLICATION EXTERNALLY THREE TIMES EVERY DAY 5 DAY(S)       naproxen sodium (ALEVE) 220 MG tablet Take by mouth       zolpidem (AMBIEN) 5 MG tablet          No current facility-administered medications on file prior to visit.      Family History  Family History  Family history unknown: Yes        Social History        Tobacco Use  Smoking Status Former Smoker   Quit date: 1992   Years since quitting: 30.8  Smokeless Tobacco Never Used      Social History  Social History         Socioeconomic History   Marital status: Married  Tobacco Use   Smoking status: Former Smoker      Quit date: 1992      Years since quitting: 30.8  Smokeless tobacco: Never Used  Substance and Sexual Activity   Alcohol use: Yes      Comment: 1 drink daily   Drug use: Never        Objective:         Vitals:    01/18/21 0957  BP: 136/80  Pulse: 98  Temp: 36.7 C (98 F)  SpO2: 99%  Weight: 94.4 kg (208 lb 3.2 oz)  Height: 184.2 cm (6' 0.5")    Body mass index is 27.85 kg/m.   Physical Exam    Constitutional:  WDWN in NAD, conversant, no obvious deformities; lying in bed comfortably Eyes:  Pupils equal, round; sclera anicteric; moist conjunctiva; no lid lag HENT:  Oral mucosa moist; good dentition  Neck:  No masses palpated, trachea midline; no thyromegaly Lungs:  CTA bilaterally; normal respiratory effort CV:  Regular rate and rhythm; no murmurs; extremities well-perfused with no edema Abd:  +bowel sounds, soft, non-tender, no palpable organomegaly; reducible umbilical hernia about 2 cm across Musc:  Unable to assess gait; no apparent clubbing or cyanosis in  extremities Lymphatic:  No palpable cervical or axillary lymphadenopathy Skin:  Warm, dry; no sign of jaundice Psychiatric - alert and oriented x 4; calm mood and affect       Assessment and Plan:  Diagnoses and all orders for this visit:   Umbilical hernia without obstruction or gangrene       Umbilical hernia repair with mesh.The surgical procedure has been discussed with the patient.  Potential risks, benefits, alternative treatments, and expected outcomes have been explained.  All of the patient's questions at this time have been answered.  The likelihood of reaching the patient's treatment goal is good.  The patient understand the proposed surgical procedure and wishes to proceed.     Manuel Mcguffin Jearld Adjutant, MD  01/18/2021 10:28 AM

## 2021-01-21 DIAGNOSIS — I10 Essential (primary) hypertension: Secondary | ICD-10-CM | POA: Diagnosis not present

## 2021-01-21 DIAGNOSIS — R82998 Other abnormal findings in urine: Secondary | ICD-10-CM | POA: Diagnosis not present

## 2021-01-21 DIAGNOSIS — I700 Atherosclerosis of aorta: Secondary | ICD-10-CM | POA: Diagnosis not present

## 2021-01-21 DIAGNOSIS — R918 Other nonspecific abnormal finding of lung field: Secondary | ICD-10-CM | POA: Diagnosis not present

## 2021-01-21 DIAGNOSIS — K429 Umbilical hernia without obstruction or gangrene: Secondary | ICD-10-CM | POA: Diagnosis not present

## 2021-01-21 DIAGNOSIS — R7301 Impaired fasting glucose: Secondary | ICD-10-CM | POA: Diagnosis not present

## 2021-01-21 DIAGNOSIS — Z1331 Encounter for screening for depression: Secondary | ICD-10-CM | POA: Diagnosis not present

## 2021-01-21 DIAGNOSIS — Z23 Encounter for immunization: Secondary | ICD-10-CM | POA: Diagnosis not present

## 2021-01-21 DIAGNOSIS — Z0000 Encounter for general adult medical examination without abnormal findings: Secondary | ICD-10-CM | POA: Diagnosis not present

## 2021-01-21 DIAGNOSIS — N401 Enlarged prostate with lower urinary tract symptoms: Secondary | ICD-10-CM | POA: Diagnosis not present

## 2021-01-21 DIAGNOSIS — E785 Hyperlipidemia, unspecified: Secondary | ICD-10-CM | POA: Diagnosis not present

## 2021-01-21 DIAGNOSIS — I2510 Atherosclerotic heart disease of native coronary artery without angina pectoris: Secondary | ICD-10-CM | POA: Diagnosis not present

## 2021-01-22 ENCOUNTER — Telehealth: Payer: Self-pay | Admitting: Cardiology

## 2021-01-22 NOTE — Telephone Encounter (Signed)
   [facility] Pre-operative Risk Assessment    Patient Name: Manuel Friedman  DOB: December 06, 1940 MRN: [id number]  HEARTCARE STAFF:  - IMPORTANT!!!!!! Under Visit Info/Reason for Call, type in Other and utilize the format Clearance MM/DD/YY or Clearance TBD. Do not use dashes or single digits. - Please review there is not already an duplicate clearance open for this procedure. - If request is for dental extraction, please clarify the # of teeth to be extracted. - If the patient is currently at the dentist's office, call Pre-Op Callback Staff (MA/nurse) to input urgent request.  - If the patient is not currently in the dentist office, please route to the Pre-Op pool.  Request for surgical clearance:  What type of surgery is being performed? umbilical hernia repair   When is this surgery scheduled? 01/26/21  What type of clearance is required (medical clearance vs. Pharmacy clearance to hold med vs. Both)? Medical   Are there any medications that need to be held prior to surgery and how long? N/a  Practice name and name of physician performing surgery? [facility]/ [facility]   What is the office phone number? [phone]   7.   What is the office fax number? [phone]  8.   Anesthesia type (None, local, MAC, general) ? General    Manuel Friedman 01/22/2021, 1:06 PM  _________________________________________________________________   (provider comments below)

## 2021-01-22 NOTE — Telephone Encounter (Signed)
I called the pt and explained that he was going to need an appt for pre op clearance. Pt primary card was Dr. Stanford Breed, last seen 2018, PV MD was Dr. Gwenlyn Found, last seen 06/2017, which this will mean the pt will need a NEW PT Appt. I called and offered the pt an appt 01/26/21 with Dr. Johnsie Cancel (DOD). Pt tells me that he feels good and he does not need a cardiology appt. Pt states to me that there is nothing that Dr. Stanford Breed can do to help clearance him for his surgery that Dr. Joylene Draft can't do. I explained to the pt that the surgeon's office is the one requesting cardiac clearance. Pt argued with me and said he was going to have his surgery 01/26/21. Pt said if the surgeon wants they can call Dr. Joylene Draft office for clearance. I did tell the pt that it is his choice if he chooses to not see cardiology. I stated to the pt that I will let the surgeon's office that he haas chosen to not see cardiology. Pt said on 01/26/21 he will be there 6 am for his surgery.

## 2021-01-22 NOTE — Telephone Encounter (Signed)
   Name: Manuel Friedman  DOB: 10-Jun-1940  MRN: [id number]  Primary Cardiologist: Kirk Ruths, MD - last seen by Dr. Gwenlyn Found in 2019 for PAD  Chart reviewed as part of pre-operative protocol coverage. Because of [name]'s past medical history and time since last visit, he will require a follow-up visit in order to better assess preoperative cardiovascular risk.  Pre-op covering staff: - Please schedule appointment and call patient to inform them. Please add "pre-op clearance" to the appointment notes so provider is aware. - Please contact requesting surgeon's office via preferred method (i.e, phone, fax) to inform them of need for appointment prior to surgery.   Abigail Butts, PA-C  01/22/2021, 1:39 PM

## 2021-01-28 ENCOUNTER — Other Ambulatory Visit: Payer: Self-pay

## 2021-01-28 ENCOUNTER — Ambulatory Visit: Payer: Medicare HMO | Admitting: Internal Medicine

## 2021-01-28 ENCOUNTER — Encounter: Payer: Self-pay | Admitting: Internal Medicine

## 2021-01-28 VITALS — BP 125/72 | HR 76 | Ht 73.0 in | Wt 206.2 lb

## 2021-01-28 DIAGNOSIS — Z01810 Encounter for preprocedural cardiovascular examination: Secondary | ICD-10-CM | POA: Diagnosis not present

## 2021-01-28 NOTE — Progress Notes (Signed)
Cardiology Office Note:    Date:  01/28/2021   ID:  Manuel Friedman, DOB [birth year], MRN [id number]  PCP:  [name], MD   [place] [place] Prov[facility] Cardiologist:  Kirk Ruths, MD     Referring MD: [name], MD   No chief complaint on file. Pre-operative eval  History of Present Illness:    Manuel Friedman is a 80 y.o. male with a hx of PAD follows with Dr. Gwenlyn Found, PCI below 2013, s/p right common femoral endarterectomy with patch angioplasty 04/06/2017, former smoker, HTN, HLD referral for pre-operative evaluation   He notes that he has followed with the practice for awhile. He is currently not taking BP medication. It is well controlled. He feels well. He has a hernia. He walks and goes to the gym. He walks his dog often. He does not report angina, dyspnea on exertion, LE edema,. LH, dizziness, or syncope. He takes atorvastatin. He stopped ASA 2/2 persistent nose bleeding.He recently had COVID 2020 and his blood pressure has dropped, so he is holding his medications.   Cardiology Studies -EKG- NSR RBBB  -01/12/2017: 1. 3.3 cm fusiform infrarenal abdominal aortic aneurysm. Recommend followup by ultrasound in 3 years. This recommendation follows ACR consensus guidelines: White Paper of the ACR Incidental Findings Committee II on Vascular Findings. J Am Coll Radiol 2013; 10:789-794. 2. Tortuous bilateral iliac arterial systems without stenosis or aneurysm. 3. Calcified plaque across the RIGHT common femoral artery bifurcation into the proximal SFA resulting in stenosis of probable hemodynamic significance. Correlate with segmental pressures. 4. Mild eccentric calcified plaque in the LEFT common femoral artery across its bifurcation, without definite significant stenosis. 5. Contiguous three-vessel tibial runoff bilaterally.  -Cardiac CT performed in January of 2013 revealed less than 40% LAD. There was a less then 40% circumflex. RCA felt to be occluded with distal  vessel filling by collaterals. Cardiac catheterization in January of 2013 showed a normal left main. There was a 30% proximal LAD. There was a 20% first diagonal. There was a 20% proximal third obtuse marginal. The right coronary artery was occluded. Ejection fraction was 55%.    Past Medical History:  Diagnosis Date   Adenomatous colon polyp    Arthritis    CAD (coronary artery disease)    s/p cath 03/2011 with total RCA, left to right collaterals and mild nonobstructive disease in the LAD and LCX. Trying medical management but could attempt PCI of the RCA   Cancer [facility])    skin cancer   Cataract    Bil/had surgery   COPD (chronic obstructive pulmonary disease) ([place])    COVID-19 virus infection 11/06/2018   Decreased hearing    wears hearing aid/Bil   Depression    Diverticulosis    Esophageal stricture    Hiatal hernia    HTN (hypertension)    Hyperlipemia    Internal and external hemorrhoids without complication    Lung nodule    Noted on CTA Jan 2013. Needs follow upstudy   Peripheral vascular disease ([place])    Rosacea    Swelling of ankle, right    Umbilical hernia    hx    Past Surgical History:  Procedure Laterality Date   CATARACT EXTRACTION, BILATERAL     COLONOSCOPY W/ BIOPSIES AND POLYPECTOMY  01/30/2007   diverticulosis, internal and external hemorrhoids   ENDARTERECTOMY FEMORAL Right 04/06/2017   Procedure: ENDARTERECTOMY RIGHT COMMON FEMORAL ARTERY;  Surgeon: Angelia Mould, MD;  Location: [place];  Service: Vascular;  Laterality:  Right;   LOWER EXTREMITY ANGIOGRAPHY N/A 03/27/2017   Procedure: LOWER EXTREMITY ANGIOGRAPHY;  Surgeon: Lorretta Harp, MD;  Location: [facility];  Service: Cardiovascular;  Laterality: N/A;   PATCH ANGIOPLASTY Right 04/06/2017   Procedure: PATCH ANGIOPLASTY USING RIGHT GREATER SAPHENOUS VEIN ON RIGHT COMMON FEMORAL ARTERY;  Surgeon: Angelia Mould, MD;  Location: [place];  Service: Vascular;  Laterality: Right;    TONSILLECTOMY     UPPER GASTROINTESTINAL ENDOSCOPY  02/19/2009   stricture, GERD, Hiatal hernia    Current Medications: Current Meds  Medication Sig   atorvastatin (LIPITOR) 80 MG tablet Take 80 mg by mouth daily.   cetirizine (ZYRTEC) 10 MG tablet Take 10 mg by mouth as needed for allergies.    escitalopram (LEXAPRO) 10 MG tablet Take 1 tablet (10 mg total) by mouth daily.   magnesium oxide (MAG-OX) 400 MG tablet Take 400 mg by mouth daily.   Multiple Vitamin (MULTIVITAMIN WITH MINERALS) TABS tablet Take 1 tablet by mouth daily.   naproxen sodium (ALEVE) 220 MG tablet Take 220 mg by mouth daily as needed (pain).   pantoprazole (PROTONIX) 20 MG tablet TAKE 1 TABLET EVERY DAY BEFORE BREAKFAST (NEED MD APPOINTMENT)   Current Facility-Administered Medications for the 01/28/21 encounter (Office Visit) with [name], MD  Medication   0.9 %  sodium chloride infusion     Allergies:   Patient has no known allergies.   Social History   Socioeconomic History   Marital status: Married    Spouse name: Not on file   Number of children: 2   Years of education: Not on file   Highest education level: Not on file  Occupational History   Not on file  Tobacco Use   Smoking status: Former    Types: Cigarettes    Quit date: 02/29/1992    Years since quitting: 28.9   Smokeless tobacco: Never  Vaping Use   Vaping Use: Never used  Substance and Sexual Activity   Alcohol use: Yes    Alcohol/week: 7.0 standard drinks    Types: 7 Standard drinks or equivalent per week   Drug use: No   Sexual activity: Yes  Other Topics Concern   Not on file  Social History Narrative   Married and retired   2 adult children   Former smoker, 1 drink a day no drugs   Social Determinants of Radio broadcast assistant Strain: Not on file  Food Insecurity: Not on file  Transportation Needs: Not on file  Physical Activity: Not on file  Stress: Not on file  Social Connections: Not on file      Family History: The patient's family history includes Multiple myeloma in his father; Pancreatic cancer in his mother. There is no history of Colon cancer, Stomach cancer, Esophageal cancer, or Rectal cancer.  ROS:   Please see the history of present illness.     All other systems reviewed and are negative.  EKGs/Labs/Other Studies Reviewed:    The following studies were reviewed today:   EKG:  EKG is  ordered today.  The ekg ordered today demonstrates  NSR, RBBB  Recent Labs: No results found for requested labs within last 8760 hours.  Recent Lipid Panel    Component Value Date/Time   CHOL 78 11/09/2018 0423   CHOL 155 01/09/2017 0933   TRIG 107 11/09/2018 0423   HDL 12 (L) 11/09/2018 0423   HDL 67 01/09/2017 0933   CHOLHDL 6.5 11/09/2018 0423  VLDL 21 11/09/2018 0423   LDLCALC 45 11/09/2018 0423   LDLCALC 72 01/09/2017 0933     Risk Assessment/Calculations:           Physical Exam:    VS:  Ht '6\' 1"'  (1.854 m)   Wt 206 lb 3.2 oz (93.5 kg)   BMI 27.20 kg/m     Wt Readings from Last 3 Encounters:  01/28/21 206 lb 3.2 oz (93.5 kg)  03/13/19 202 lb 8 oz (91.9 kg)  01/24/19 199 lb (90.3 kg)     GEN:  Well nourished, well developed in no acute distress HEENT: Normal NECK: No JVD; No carotid bruits LYMPHATICS: No lymphadenopathy CARDIAC: RRR, no murmurs, rubs, gallops RESPIRATORY:  Clear to auscultation without rales, wheezing or rhonchi  ABDOMEN: Soft, non-tender, non-distended MUSCULOSKELETAL:  No edema; No deformity  SKIN: Warm and dry NEUROLOGIC:  Alert and oriented x 3 PSYCHIATRIC:  Normal affect   ASSESSMENT:    #Pre-operative risk: RCRI Class I, low risk for low risk surgery. He can conduct greater than  4 Mets without any limitations. He has no history of heart failure. He is asymptomatic. I have no reservations for his umbilical hernia repair with mesh  #PAD:s/p right common femoral endarterectomy. Continue atorvastatin 80 mg daily. ASA causing  bleeding ok to hold  #HTN: Ok to hold blood pressure medications. His blood pressures are normal.   Abdominal Aortic Aneurysm: will follow up surveillance on his return visit.   PLAN:    In order of problems listed above:   I have no reservations for his umbilical hernia repair with mesh Follow up in 6 months        Medication Adjustments/Labs and Tests Ordered: Current medicines are reviewed at length with the patient today.  Concerns regarding medicines are outlined above.    Signed, [name], MD  01/28/2021 8:01 AM    [place]

## 2021-01-28 NOTE — Patient Instructions (Signed)
Medication Instructions:  No Changes In Medications at this time.  *If you need a refill on your cardiac medications before your next appointment, please call your pharmacy*  Follow-Up: At [facility], you and your health needs are our priority.  As part of our continuing mission to provide you with exceptional heart care, we have created designated Provider Care Teams.  These Care Teams include your primary Cardiologist (physician) and Advanced Practice Providers (APPs -  Physician Assistants and Nurse Practitioners) who all work together to provide you with the care you need, when you need it.  Your next appointment:   6 month(s)  The format for your next appointment:   In Person  Provider:   [name], MD {

## 2021-02-01 DIAGNOSIS — Z23 Encounter for immunization: Secondary | ICD-10-CM | POA: Diagnosis not present

## 2021-02-22 ENCOUNTER — Encounter (HOSPITAL_BASED_OUTPATIENT_CLINIC_OR_DEPARTMENT_OTHER): Payer: Self-pay | Admitting: Surgery

## 2021-02-22 ENCOUNTER — Other Ambulatory Visit: Payer: Self-pay

## 2021-02-26 NOTE — Progress Notes (Signed)

## 2021-03-01 NOTE — Anesthesia Preprocedure Evaluation (Addendum)
Anesthesia Evaluation  Patient identified by MRN, date of birth, ID band Patient awake    Reviewed: Allergy & Precautions, NPO status , Patient's Chart, lab work & pertinent test results  History of Anesthesia Complications Negative for: history of anesthetic complications  Airway Mallampati: II  TM Distance: >3 FB Neck ROM: Full    Dental  (+) Missing, Partial Upper   Pulmonary COPD, former smoker,    Pulmonary exam normal        Cardiovascular hypertension, + CAD and + Peripheral Vascular Disease  Normal cardiovascular exam  RBBB   Neuro/Psych Depression negative neurological ROS     GI/Hepatic Neg liver ROS, hiatal hernia, GERD  Medicated and Controlled,Umbilical hernia   Endo/Other  negative endocrine ROS  Renal/GU negative Renal ROS  negative genitourinary   Musculoskeletal  (+) Arthritis ,   Abdominal   Peds  Hematology negative hematology ROS (+)   Anesthesia Other Findings Day of surgery medications reviewed with patient.  Reproductive/Obstetrics negative OB ROS                            Anesthesia Physical Anesthesia Plan  ASA: 3  Anesthesia Plan: General   Post-op Pain Management: Tylenol PO (pre-op)   Induction: Intravenous  PONV Risk Score and Plan: 2 and Ondansetron, Dexamethasone and Treatment may vary due to age or medical condition  Airway Management Planned: LMA  Additional Equipment: None  Intra-op Plan:   Post-operative Plan: Extubation in OR  Informed Consent: I have reviewed the patients History and Physical, chart, labs and discussed the procedure including the risks, benefits and alternatives for the proposed anesthesia with the patient or authorized representative who has indicated his/her understanding and acceptance.     Dental advisory given  Plan Discussed with: CRNA  Anesthesia Plan Comments:        Anesthesia Quick Evaluation

## 2021-03-02 ENCOUNTER — Other Ambulatory Visit: Payer: Self-pay

## 2021-03-02 ENCOUNTER — Ambulatory Visit (HOSPITAL_BASED_OUTPATIENT_CLINIC_OR_DEPARTMENT_OTHER): Payer: Medicare HMO | Admitting: Anesthesiology

## 2021-03-02 ENCOUNTER — Encounter (HOSPITAL_BASED_OUTPATIENT_CLINIC_OR_DEPARTMENT_OTHER): Payer: Self-pay | Admitting: Surgery

## 2021-03-02 ENCOUNTER — Encounter (HOSPITAL_BASED_OUTPATIENT_CLINIC_OR_DEPARTMENT_OTHER)
Admission: RE | Disposition: A | Discharge status: Home / Self Care | Payer: Self-pay | Source: Home / Self Care | Attending: Surgery

## 2021-03-02 ENCOUNTER — Ambulatory Visit (HOSPITAL_BASED_OUTPATIENT_CLINIC_OR_DEPARTMENT_OTHER)
Admission: RE | Admit: 2021-03-02 | Discharge: 2021-03-02 | Disposition: A | Discharge status: Home / Self Care | Payer: Medicare HMO | Attending: Surgery | Admitting: Surgery

## 2021-03-02 DIAGNOSIS — M1990 Unspecified osteoarthritis, unspecified site: Secondary | ICD-10-CM | POA: Insufficient documentation

## 2021-03-02 DIAGNOSIS — I2510 Atherosclerotic heart disease of native coronary artery without angina pectoris: Secondary | ICD-10-CM | POA: Insufficient documentation

## 2021-03-02 DIAGNOSIS — Z87891 Personal history of nicotine dependence: Secondary | ICD-10-CM | POA: Insufficient documentation

## 2021-03-02 DIAGNOSIS — K449 Diaphragmatic hernia without obstruction or gangrene: Secondary | ICD-10-CM | POA: Insufficient documentation

## 2021-03-02 DIAGNOSIS — I10 Essential (primary) hypertension: Secondary | ICD-10-CM | POA: Diagnosis not present

## 2021-03-02 DIAGNOSIS — K219 Gastro-esophageal reflux disease without esophagitis: Secondary | ICD-10-CM | POA: Diagnosis not present

## 2021-03-02 DIAGNOSIS — K429 Umbilical hernia without obstruction or gangrene: Secondary | ICD-10-CM | POA: Insufficient documentation

## 2021-03-02 DIAGNOSIS — I739 Peripheral vascular disease, unspecified: Secondary | ICD-10-CM | POA: Diagnosis not present

## 2021-03-02 DIAGNOSIS — J449 Chronic obstructive pulmonary disease, unspecified: Secondary | ICD-10-CM | POA: Insufficient documentation

## 2021-03-02 HISTORY — PX: INSERTION OF MESH: SHX5868

## 2021-03-02 HISTORY — PX: UMBILICAL HERNIA REPAIR: SHX196

## 2021-03-02 SURGERY — REPAIR, HERNIA, UMBILICAL, ADULT
Anesthesia: General | Site: Abdomen

## 2021-03-02 MED ORDER — PROPOFOL 10 MG/ML IV BOLUS
INTRAVENOUS | Status: DC | PRN
  Administered 2021-03-02: 40 mg via INTRAVENOUS
  Administered 2021-03-02: 140 mg via INTRAVENOUS

## 2021-03-02 MED ORDER — EPHEDRINE SULFATE 50 MG/ML IJ SOLN
INTRAMUSCULAR | Status: DC | PRN
  Administered 2021-03-02: 10 mg via INTRAVENOUS

## 2021-03-02 MED ORDER — HYDROCODONE-ACETAMINOPHEN 5-325 MG PO TABS: 1.0000 | ORAL_TABLET | Freq: Four times a day (QID) | ORAL | 0 refills | Status: DC | PRN

## 2021-03-02 MED ORDER — CHLORHEXIDINE GLUCONATE CLOTH 2 % EX PADS: 6.0000 | MEDICATED_PAD | Freq: Once | CUTANEOUS | Status: DC

## 2021-03-02 MED ORDER — ONDANSETRON HCL 4 MG/2ML IJ SOLN
INTRAMUSCULAR | Status: DC | PRN
  Administered 2021-03-02: 4 mg via INTRAVENOUS

## 2021-03-02 MED ORDER — OXYCODONE HCL 5 MG/5ML PO SOLN: 5.0000 mg | Freq: Once | ORAL | Status: DC | PRN

## 2021-03-02 MED ORDER — ACETAMINOPHEN 500 MG PO TABS
ORAL_TABLET | ORAL | Status: AC
  Filled 2021-03-02: qty 2

## 2021-03-02 MED ORDER — PHENYLEPHRINE 40 MCG/ML (10ML) SYRINGE FOR IV PUSH (FOR BLOOD PRESSURE SUPPORT)
PREFILLED_SYRINGE | INTRAVENOUS | Status: AC
  Filled 2021-03-02: qty 20

## 2021-03-02 MED ORDER — ACETAMINOPHEN 500 MG PO TABS
1000.0000 mg | ORAL_TABLET | ORAL | Status: AC
  Administered 2021-03-02: 1000 mg via ORAL

## 2021-03-02 MED ORDER — FENTANYL CITRATE (PF) 100 MCG/2ML IJ SOLN
INTRAMUSCULAR | Status: AC
  Filled 2021-03-02: qty 2

## 2021-03-02 MED ORDER — BUPIVACAINE-EPINEPHRINE 0.25% -1:200000 IJ SOLN
INTRAMUSCULAR | Status: DC | PRN
  Administered 2021-03-02: 10 mL

## 2021-03-02 MED ORDER — DEXAMETHASONE SODIUM PHOSPHATE 10 MG/ML IJ SOLN
INTRAMUSCULAR | Status: AC
  Filled 2021-03-02: qty 1

## 2021-03-02 MED ORDER — ONDANSETRON HCL 4 MG/2ML IJ SOLN
INTRAMUSCULAR | Status: AC
  Filled 2021-03-02: qty 2

## 2021-03-02 MED ORDER — EPHEDRINE 5 MG/ML INJ
INTRAVENOUS | Status: AC
  Filled 2021-03-02: qty 5

## 2021-03-02 MED ORDER — FENTANYL CITRATE (PF) 100 MCG/2ML IJ SOLN
INTRAMUSCULAR | Status: DC | PRN
  Administered 2021-03-02 (×2): 25 ug via INTRAVENOUS
  Administered 2021-03-02: 50 ug via INTRAVENOUS

## 2021-03-02 MED ORDER — CEFAZOLIN SODIUM-DEXTROSE 2-4 GM/100ML-% IV SOLN
2.0000 g | INTRAVENOUS | Status: AC
  Administered 2021-03-02: 2 g via INTRAVENOUS

## 2021-03-02 MED ORDER — PROPOFOL 10 MG/ML IV BOLUS
INTRAVENOUS | Status: AC
  Filled 2021-03-02: qty 20

## 2021-03-02 MED ORDER — CEFAZOLIN SODIUM-DEXTROSE 2-4 GM/100ML-% IV SOLN
INTRAVENOUS | Status: AC
  Filled 2021-03-02: qty 100

## 2021-03-02 MED ORDER — DEXAMETHASONE SODIUM PHOSPHATE 10 MG/ML IJ SOLN
INTRAMUSCULAR | Status: DC | PRN
  Administered 2021-03-02: 5 mg via INTRAVENOUS

## 2021-03-02 MED ORDER — LIDOCAINE HCL (CARDIAC) PF 100 MG/5ML IV SOSY
PREFILLED_SYRINGE | INTRAVENOUS | Status: DC | PRN
  Administered 2021-03-02: 100 mg via INTRATRACHEAL

## 2021-03-02 MED ORDER — FENTANYL CITRATE (PF) 100 MCG/2ML IJ SOLN: 25.0000 ug | INTRAMUSCULAR | Status: DC | PRN

## 2021-03-02 MED ORDER — OXYCODONE HCL 5 MG PO TABS: 5.0000 mg | ORAL_TABLET | Freq: Once | ORAL | Status: DC | PRN

## 2021-03-02 MED ORDER — PHENYLEPHRINE HCL (PRESSORS) 10 MG/ML IV SOLN
INTRAVENOUS | Status: DC | PRN
  Administered 2021-03-02: 120 ug via INTRAVENOUS
  Administered 2021-03-02: 80 ug via INTRAVENOUS
  Administered 2021-03-02 (×2): 120 ug via INTRAVENOUS

## 2021-03-02 MED ORDER — LACTATED RINGERS IV SOLN: INTRAVENOUS | Status: DC

## 2021-03-02 MED ORDER — LIDOCAINE 2% (20 MG/ML) 5 ML SYRINGE
INTRAMUSCULAR | Status: AC
  Filled 2021-03-02: qty 5

## 2021-03-02 SURGICAL SUPPLY — 58 items
APL PRP STRL LF DISP 70% ISPRP (MISCELLANEOUS) ×1
APL SKNCLS STERI-STRIP NONHPOA (GAUZE/BANDAGES/DRESSINGS) ×1
APL SWBSTK 6 STRL LF DISP (MISCELLANEOUS)
APPLICATOR COTTON TIP 6 STRL (MISCELLANEOUS) IMPLANT
APPLICATOR COTTON TIP 6IN STRL (MISCELLANEOUS)
BENZOIN TINCTURE PRP APPL 2/3 (GAUZE/BANDAGES/DRESSINGS) ×2 IMPLANT
BLADE CLIPPER SURG (BLADE) ×2 IMPLANT
BLADE HEX COATED 2.75 (ELECTRODE) ×2 IMPLANT
BLADE SURG 15 STRL LF DISP TIS (BLADE) ×1 IMPLANT
BLADE SURG 15 STRL SS (BLADE) ×2
CANISTER SUCT 1200ML W/VALVE (MISCELLANEOUS) IMPLANT
CHLORAPREP W/TINT 26 (MISCELLANEOUS) ×2 IMPLANT
COVER BACK TABLE 60X90IN (DRAPES) ×2 IMPLANT
COVER MAYO STAND STRL (DRAPES) ×2 IMPLANT
DECANTER SPIKE VIAL GLASS SM (MISCELLANEOUS) IMPLANT
DRAPE LAPAROTOMY 100X72 PEDS (DRAPES) ×2 IMPLANT
DRAPE UTILITY XL STRL (DRAPES) ×2 IMPLANT
DRSG TEGADERM 4X4.75 (GAUZE/BANDAGES/DRESSINGS) ×2 IMPLANT
ELECT REM PT RETURN 9FT ADLT (ELECTROSURGICAL) ×2
ELECTRODE REM PT RTRN 9FT ADLT (ELECTROSURGICAL) ×1 IMPLANT
GAUZE SPONGE 4X4 12PLY STRL LF (GAUZE/BANDAGES/DRESSINGS) ×1 IMPLANT
GLOVE SURG ENC MOIS LTX SZ7 (GLOVE) ×2 IMPLANT
GLOVE SURG UNDER POLY LF SZ7 (GLOVE) ×1 IMPLANT
GLOVE SURG UNDER POLY LF SZ7.5 (GLOVE) ×2 IMPLANT
GOWN STRL REUS W/ TWL LRG LVL3 (GOWN DISPOSABLE) ×2 IMPLANT
GOWN STRL REUS W/TWL LRG LVL3 (GOWN DISPOSABLE) ×4
MESH VENTRALEX ST 1-7/10 CRC S (Mesh General) ×1 IMPLANT
NDL HYPO 25X1 1.5 SAFETY (NEEDLE) ×1 IMPLANT
NDL SAFETY ECLIPSE 18X1.5 (NEEDLE) IMPLANT
NEEDLE HYPO 18GX1.5 SHARP (NEEDLE)
NEEDLE HYPO 25X1 1.5 SAFETY (NEEDLE) ×2 IMPLANT
NS IRRIG 1000ML POUR BTL (IV SOLUTION) IMPLANT
PACK BASIN DAY SURGERY FS (CUSTOM PROCEDURE TRAY) ×2 IMPLANT
PENCIL SMOKE EVACUATOR (MISCELLANEOUS) ×2 IMPLANT
SLEEVE SCD COMPRESS KNEE MED (STOCKING) ×2 IMPLANT
SPONGE GAUZE 2X2 8PLY STRL LF (GAUZE/BANDAGES/DRESSINGS) ×2 IMPLANT
STAPLER VISISTAT 35W (STAPLE) IMPLANT
STRIP CLOSURE SKIN 1/2X4 (GAUZE/BANDAGES/DRESSINGS) ×2 IMPLANT
SUT MNCRL AB 4-0 PS2 18 (SUTURE) ×2 IMPLANT
SUT NOVA 0 T19/GS 22DT (SUTURE) IMPLANT
SUT NOVA NAB DX-16 0-1 5-0 T12 (SUTURE) IMPLANT
SUT NOVA NAB GS-21 1 T12 (SUTURE) IMPLANT
SUT PDS AB 0 CT 36 (SUTURE) IMPLANT
SUT PROLENE 0 CT 2 (SUTURE) IMPLANT
SUT SILK 3 0 TIES 17X18 (SUTURE)
SUT SILK 3-0 18XBRD TIE BLK (SUTURE) IMPLANT
SUT VIC AB 2-0 CT1 27 (SUTURE)
SUT VIC AB 2-0 CT1 TAPERPNT 27 (SUTURE) IMPLANT
SUT VIC AB 3-0 SH 27 (SUTURE) ×2
SUT VIC AB 3-0 SH 27X BRD (SUTURE) ×1 IMPLANT
SUT VIC AB 4-0 BRD 54 (SUTURE) IMPLANT
SUT VIC AB 4-0 SH 18 (SUTURE) IMPLANT
SUT VICRYL 4-0 PS2 18IN ABS (SUTURE) IMPLANT
SYR BULB EAR ULCER 3OZ GRN STR (SYRINGE) IMPLANT
SYR CONTROL 10ML LL (SYRINGE) ×2 IMPLANT
TOWEL GREEN STERILE FF (TOWEL DISPOSABLE) ×2 IMPLANT
TUBE CONNECTING 20X1/4 (TUBING) ×2 IMPLANT
YANKAUER SUCT BULB TIP NO VENT (SUCTIONS) ×2 IMPLANT

## 2021-03-02 NOTE — Anesthesia Postprocedure Evaluation (Signed)
Anesthesia Post Note  Patient: WHALEN TROMPETER  Procedure(s) Performed: OPEN UMBILICAL HERNIA REPAIR WITH MESH (Abdomen) INSERTION OF MESH (Abdomen)     Patient location during evaluation: PACU Anesthesia Type: General Level of consciousness: awake and alert and oriented Pain management: pain level controlled Vital Signs Assessment: post-procedure vital signs reviewed and stable Respiratory status: spontaneous breathing, nonlabored ventilation and respiratory function stable Cardiovascular status: blood pressure returned to baseline Postop Assessment: no apparent nausea or vomiting Anesthetic complications: no   No notable events documented.  Last Vitals:  Vitals:   03/02/21 0945 03/02/21 1000  BP: 121/76 (!) 150/73  Pulse: 80 80  Resp: 18 18  Temp:  37 C  SpO2: 97% 96%    Last Pain:  Vitals:   03/02/21 1000  TempSrc:   PainSc: Linda

## 2021-03-02 NOTE — Discharge Instructions (Addendum)
[facility] _______[facility]  UMBILICAL HERNIA REPAIR: POST OP INSTRUCTIONS  Always review your discharge instruction sheet given to you by the facility where your surgery was performed. IF YOU HAVE DISABILITY OR FAMILY LEAVE FORMS, YOU MUST BRING THEM TO THE OFFICE FOR PROCESSING.   DO NOT GIVE THEM TO YOUR DOCTOR.  1. A  prescription for pain medication may be given to you upon discharge.  Take your pain medication as prescribed, if needed.  If narcotic pain medicine is not needed, then you may take acetaminophen (Tylenol) or ibuprofen (Advil) as needed. 2. Take your usually prescribed medications unless otherwise directed. If you need a refill on your pain medication, please contact your pharmacy.  They will contact our office to request authorization. Prescriptions will not be filled after 5 pm or on week-ends. 3. You should follow a light diet the first 24 hours after arrival home, such as soup and crackers, etc.  Be sure to include lots of fluids daily.  Resume your normal diet the day after surgery. 4.Most patients will experience some swelling and bruising around the umbilicus.  Ice packs and reclining will help.  Swelling and bruising can take several days to resolve.  6. It is common to experience some constipation if taking pain medication after surgery.  Increasing fluid intake and taking a stool softener (such as Colace) will usually help or prevent this problem from occurring.  A mild laxative (Milk of Magnesia or Miralax) should be taken according to package directions if there are no bowel movements after 48 hours. 7. Unless discharge instructions indicate otherwise, you may remove your bandages 24-48 hours after surgery, and you may shower at that time.  You may have steri-strips (small skin tapes) in place directly over the incision.  These strips should be left on the skin for 7-10 days.   8. ACTIVITIES:  You may resume regular (light) daily activities beginning the next  day--such as daily self-care, walking, climbing stairs--gradually increasing activities as tolerated.  You may have sexual intercourse when it is comfortable.  Refrain from any heavy lifting or straining until approved by your doctor.  a.You may drive when you are no longer taking prescription pain medication, you can comfortably wear a seatbelt, and you can safely maneuver your car and apply brakes. b.RETURN TO WORK:   _____________________________________________  9.You should see your doctor in the office for a follow-up appointment approximately 2-3 weeks after your surgery.  Make sure that you call for this appointment within a day or two after you arrive home to insure a convenient appointment time. 10.OTHER INSTRUCTIONS: _________________________    _____________________________________  WHEN TO CALL YOUR DOCTOR: Fever over 101.0 Inability to urinate Nausea and/or vomiting Extreme swelling or bruising Continued bleeding from incision. Increased pain, redness, or drainage from the incision  The clinic staff is available to answer your questions during regular business hours.  Please don't hesitate to call and ask to speak to one of the nurses for clinical concerns.  If you have a medical emergency, go to the nearest emergency room or call 911.  A surgeon from [facility] is always on call at the hospital   [address], [place], [place], [place]  [address] ?  [address], [place], [place]   [address] [phone] ? [phone] ? FAX [phone] Web site: [email or web address]  You may have Tylenol again after 2pm today, if needed.   Post Anesthesia Home Care Instructions  Activity: Get plenty of  rest for the remainder of the day. A responsible individual must stay with you for 24 hours following the procedure.  For the next 24 hours, DO NOT: -Drive a car -Paediatric nurse -Drink alcoholic beverages -Take any medication unless instructed  by your physician -Make any legal decisions or sign important papers.  Meals: Start with liquid foods such as gelatin or soup. Progress to regular foods as tolerated. Avoid greasy, spicy, heavy foods. If nausea and/or vomiting occur, drink only clear liquids until the nausea and/or vomiting subsides. Call your physician if vomiting continues.  Special Instructions/Symptoms: Your throat may feel dry or sore from the anesthesia or the breathing tube placed in your throat during surgery. If this causes discomfort, gargle with warm salt water. The discomfort should disappear within 24 hours.  If you had a scopolamine patch placed behind your ear for the management of post- operative nausea and/or vomiting:  1. The medication in the patch is effective for 72 hours, after which it should be removed.  Wrap patch in a tissue and discard in the trash. Wash hands thoroughly with soap and water. 2. You may remove the patch earlier than 72 hours if you experience unpleasant side effects which may include dry mouth, dizziness or visual disturbances. 3. Avoid touching the patch. Wash your hands with soap and water after contact with the patch.

## 2021-03-02 NOTE — Anesthesia Procedure Notes (Signed)
Procedure Name: LMA Insertion Date/Time: 03/02/2021 8:41 AM Performed by: Glory Buff, CRNA Pre-anesthesia Checklist: Patient identified, Emergency Drugs available, Suction available and Patient being monitored Patient Re-evaluated:Patient Re-evaluated prior to induction Oxygen Delivery Method: Circle system utilized Preoxygenation: Pre-oxygenation with 100% oxygen Induction Type: IV induction LMA: LMA inserted LMA Size: 4.0 Number of attempts: 1 Placement Confirmation: positive ETCO2 Tube secured with: Tape Dental Injury: Teeth and Oropharynx as per pre-operative assessment

## 2021-03-02 NOTE — Op Note (Signed)
Indications:  This is an 80 year old male who presents with at least a 3-year history of an enlarging umbilical hernia.  Previously this area was fairly soft and easily reducible.  Recently has become more symptomatic.  He is having more difficulty reducing this.  He denies any obstructive symptoms.  Pre-operative diagnosis:  Umbilical hernia  Post-operative diagnosis:  Same  Procedure:  Umbilical hernia repair with mesh  Procedure Details  The patient was seen again in the Holding Room. The risks, benefits, complications, treatment options, and expected outcomes were discussed with the patient. The possibilities of reaction to medication, pulmonary aspiration, perforation of viscus, bleeding, recurrent infection, the need for additional procedures, and development of a complication requiring transfusion or further operation were discussed with the patient and/or family. There was concurrence with the proposed plan, and informed consent was obtained. The site of surgery was properly noted/marked. The patient was taken to the Operating Room, identified as Manuel Friedman, and the procedure verified as umbilical hernia repair. A Time Out was held and the above information confirmed.  After an adequate level of general anesthesia was obtained, the patient's abdomen was prepped with Chloraprep and draped in sterile fashion.  We made a transverse incision above the umbilicus.  Dissection was carried down to the hernia sac with cautery.  We dissected bluntly around the hernia sac down to the edge of the fascial defect.  I opened the hernia sac, which contained only omentum.  We reduced the omentum back into the peritoneal space.  The fascial defect measured 1.5 cm.  We cleared the fascia in all directions.  A small Ventralex mesh was inserted into the pre-peritoneal space and was deployed.  The mesh was secured with four trans-fascial sutures of 0 Novofil.  The fascial defect was closed with multiple  interrupted figure-of-eight 0 Novofil sutures.  The base of the umbilicus was tacked down with 3-0 Vicryl.  3-0 Vicryl was used to close the subcutaneous tissues and 4-0 Monocryl was used to close the skin.  Steri-strips and clean dressing were applied.  The patient was extubated and brought to the recovery room in stable condition.  All sponge, instrument, and needle counts were correct prior to closure and at the conclusion of the case.   Estimated Blood Loss: Minimal          Complications: None; patient tolerated the procedure well.         Disposition: PACU - hemodynamically stable.         Condition: stable  Manuel Burn. Georgette Dover, MD, [facility]  General Surgery   03/02/2021 9:20 AM

## 2021-03-02 NOTE — Transfer of Care (Signed)
Immediate Anesthesia Transfer of Care Note  Patient: Manuel Friedman  Procedure(s) Performed: OPEN UMBILICAL HERNIA REPAIR WITH MESH (Abdomen) INSERTION OF MESH (Abdomen)  Patient Location: PACU  Anesthesia Type:General  Level of Consciousness: drowsy and patient cooperative  Airway & Oxygen Therapy: Patient Spontanous Breathing and Patient connected to face mask oxygen  Post-op Assessment: Report given to RN and Post -op Vital signs reviewed and stable  Post vital signs: Reviewed and stable  Last Vitals:  Vitals Value Taken Time  BP    Temp    Pulse 89 03/02/21 0920  Resp    SpO2 100 % 03/02/21 0920  Vitals shown include unvalidated device data.  Last Pain:  Vitals:   03/02/21 0747  TempSrc: Oral  PainSc: 0-No pain         Complications: No notable events documented.

## 2021-03-02 NOTE — H&P (Signed)
Subjective    Chief Complaint: Umbilical Hernia   PCP - Dr. [name]   History of Present Illness: Manuel Friedman is a 80 y.o. male who is seen today as an office consultation at the request of Dr. Joylene Draft for evaluation of Umbilical Hernia .     This is an 80 year old male who presents with at least a 3-year history of an enlarging umbilical hernia.  Previously this area was fairly soft and easily reducible.  Recently has become more symptomatic.  He is having more difficulty reducing this.  He denies any obstructive symptoms.   The patient is on no blood thinners   Review of Systems: A complete review of systems was obtained from the patient.  I have reviewed this information and discussed as appropriate with the patient.  See HPI as well for other ROS.   Review of Systems  Constitutional: Negative.   HENT: Negative.   Eyes: Negative.   Respiratory: Negative.   Cardiovascular: Negative.   Gastrointestinal: Positive for abdominal pain.  Genitourinary: Negative.   Musculoskeletal: Negative.   Skin: Negative.   Neurological: Negative.   Endo/Heme/Allergies: Negative.   Psychiatric/Behavioral: Negative.         Medical History: Past Medical History  History reviewed. No pertinent past medical history.           Patient Active Problem List  Diagnosis   Abdominal aortic aneurysm   Arthritis   CAD (coronary artery disease)   COPD (chronic obstructive pulmonary disease) (CMS-HCC)   Elevated liver enzymes   Essential (primary) hypertension      Past Surgical History  History reviewed. No pertinent surgical history.      Allergies  No Known Allergies                 Current Outpatient Medications on File Prior to Visit  Medication Sig Dispense Refill   pantoprazole (PROTONIX) 20 MG DR tablet TAKE 1 TABLET EVERY DAY BEFORE BREAKFAST (NEED MD APPOINTMENT)       atorvastatin (LIPITOR) 80 MG tablet         cetirizine (ZYRTEC) 10 MG tablet Take 10 mg by mouth as  needed       coenzyme Q10 10 mg capsule Take 1 tablet by mouth once daily       escitalopram oxalate (LEXAPRO) 20 MG tablet         magnesium oxide (MAG-OX) 400 mg (241.3 mg magnesium) tablet Take by mouth       multivitamin with minerals tablet Take 1 tablet by mouth once daily       mupirocin (BACTROBAN) 2 % ointment 1 APPLICATION EXTERNALLY THREE TIMES EVERY DAY 5 DAY(S)       naproxen sodium (ALEVE) 220 MG tablet Take by mouth       zolpidem (AMBIEN) 5 MG tablet          No current facility-administered medications on file prior to visit.      Family History  Family History  Family history unknown: Yes        Social History           Tobacco Use  Smoking Status Former Smoker   Quit date: 1992   Years since quitting: 30.8  Smokeless Tobacco Never Used      Social History  Social History             Socioeconomic History   Marital status: Married  Tobacco Use   Smoking status: Former Smoker  Quit date: 41      Years since quitting: 30.8   Smokeless tobacco: Never Used  Substance and Sexual Activity   Alcohol use: Yes      Comment: 1 drink daily   Drug use: Never        Objective:           Vitals:      BP: 136/80  Pulse: 98  Temp: 36.7 C (98 F)  SpO2: 99%  Weight: 94.4 kg (208 lb 3.2 oz)  Height: 184.2 cm (6' 0.5")    Body mass index is 27.85 kg/m.   Physical Exam    Constitutional:  WDWN in NAD, conversant, no obvious deformities; lying in bed comfortably Eyes:  Pupils equal, round; sclera anicteric; moist conjunctiva; no lid lag HENT:  Oral mucosa moist; good dentition  Neck:  No masses palpated, trachea midline; no thyromegaly Lungs:  CTA bilaterally; normal respiratory effort CV:  Regular rate and rhythm; no murmurs; extremities well-perfused with no edema Abd:  +bowel sounds, soft, non-tender, no palpable organomegaly; reducible umbilical hernia about 2 cm across Musc:  Unable to assess gait; no apparent clubbing or cyanosis in  extremities Lymphatic:  No palpable cervical or axillary lymphadenopathy Skin:  Warm, dry; no sign of jaundice Psychiatric - alert and oriented x 4; calm mood and affect       Assessment and Plan:  Diagnoses and all orders for this visit:   Umbilical hernia without obstruction or gangrene       Umbilical hernia repair with mesh.The surgical procedure has been discussed with the patient.  Potential risks, benefits, alternative treatments, and expected outcomes have been explained.  All of the patient's questions at this time have been answered.  The likelihood of reaching the patient's treatment goal is good.  The patient understand the proposed surgical procedure and wishes to proceed.      Imogene Burn. Georgette Dover, MD, [facility]  General Surgery   03/02/2021 7:17 AM

## 2021-03-04 ENCOUNTER — Encounter (HOSPITAL_BASED_OUTPATIENT_CLINIC_OR_DEPARTMENT_OTHER): Payer: Self-pay | Admitting: Surgery

## 2021-03-09 DIAGNOSIS — R040 Epistaxis: Secondary | ICD-10-CM | POA: Diagnosis not present

## 2021-03-25 ENCOUNTER — Ambulatory Visit (INDEPENDENT_AMBULATORY_CARE_PROVIDER_SITE_OTHER): Payer: Medicare HMO | Admitting: Vascular Surgery

## 2021-03-25 ENCOUNTER — Encounter: Payer: Self-pay | Admitting: Vascular Surgery

## 2021-03-25 ENCOUNTER — Other Ambulatory Visit: Payer: Self-pay

## 2021-03-25 ENCOUNTER — Ambulatory Visit (INDEPENDENT_AMBULATORY_CARE_PROVIDER_SITE_OTHER)
Admission: RE | Admit: 2021-03-25 | Discharge: 2021-03-25 | Disposition: A | Discharge status: Home / Self Care | Payer: Medicare HMO | Source: Ambulatory Visit | Attending: Vascular Surgery | Admitting: Vascular Surgery

## 2021-03-25 ENCOUNTER — Ambulatory Visit (HOSPITAL_COMMUNITY)
Admission: RE | Admit: 2021-03-25 | Discharge: 2021-03-25 | Disposition: A | Discharge status: Home / Self Care | Payer: Medicare HMO | Source: Ambulatory Visit | Attending: Vascular Surgery | Admitting: Vascular Surgery

## 2021-03-25 DIAGNOSIS — I739 Peripheral vascular disease, unspecified: Secondary | ICD-10-CM

## 2021-03-25 DIAGNOSIS — I714 Abdominal aortic aneurysm, without rupture, unspecified: Secondary | ICD-10-CM | POA: Insufficient documentation

## 2021-03-25 DIAGNOSIS — I7143 Infrarenal abdominal aortic aneurysm, without rupture: Secondary | ICD-10-CM

## 2021-03-25 NOTE — Progress Notes (Signed)
Patient name: Manuel Friedman DOB: 05/10/1940 Sex: male    Referring Provider is [name], MD  PCP is [name], MD  REASON FOR VIRTUAL VISIT: Follow-up of abdominal aortic aneurysm  I connected with Manuel Friedman on 03/25/21 at 10:20 AM EST by a telephone visit and verified that I am speaking with the correct person using two identifiers. I discussed the limitations of evaluation and management by telemedicine and the availability of in person appointments. The patient expressed understanding and agreed to proceed.  Location: Patient: Cell phone Provider: Office  HPI: Manuel Friedman is a 81 y.o. male who I been following with a small abdominal aortic aneurysm.  In 2017 it was reportedly 3.6 cm in maximum diameter.  When I saw him last on 03/13/2019 it was 3.4 cm in maximum diameter.  I set him up for a 2-year follow-up visit.  He is also undergone previous endarterectomy of the right superficial femoral artery and common femoral artery with a vein patch.  This was for a bulky calcific plaque in the right common femoral artery and proximal superficial femoral artery.  My notes state that he is previously had noninvasive studies of the lower extremities done in Dr. Jenness Corner office.  He denies any significant abdominal pain or back pain.  He has had a hernia repaired recently.  He is not a smoker.  His blood pressures been under good control.  Current Outpatient Medications  Medication Sig Dispense Refill   atorvastatin (LIPITOR) 80 MG tablet Take 80 mg by mouth daily.     cetirizine (ZYRTEC) 10 MG tablet Take 10 mg by mouth as needed for allergies.      cholecalciferol (VITAMIN D3) 25 MCG (1000 UNIT) tablet Take 1,000 Units by mouth daily.     Coenzyme Q10 (COQ-10 PO) Take 1 tablet by mouth daily.     escitalopram (LEXAPRO) 20 MG tablet Take 20 mg by mouth daily.     HYDROcodone-acetaminophen (NORCO/VICODIN) 5-325 MG tablet Take 1 tablet by mouth every 6 (six) hours as  needed for moderate pain. 15 tablet 0   losartan (COZAAR) 25 MG tablet  (Patient not taking: Reported on 01/28/2021)     magnesium oxide (MAG-OX) 400 MG tablet Take 400 mg by mouth daily.     Multiple Vitamin (MULTIVITAMIN WITH MINERALS) TABS tablet Take 1 tablet by mouth daily.     naproxen sodium (ALEVE) 220 MG tablet Take 220 mg by mouth daily as needed (pain).     pantoprazole (PROTONIX) 20 MG tablet TAKE 1 TABLET EVERY DAY BEFORE BREAKFAST (NEED MD APPOINTMENT) 90 tablet 0   vitamin B-12 (CYANOCOBALAMIN) 500 MCG tablet Take 500 mcg by mouth daily.     zinc gluconate 50 MG tablet Take 50 mg by mouth daily.     zolpidem (AMBIEN) 5 MG tablet Take 5 mg by mouth at bedtime.  (Patient not taking: Reported on 01/28/2021)     Current Facility-Administered Medications  Medication Dose Route Frequency Provider Last Rate Last Admin   0.9 %  sodium chloride infusion  500 mL Intravenous Once Gatha Mayer, MD       REVIEW OF SYSTEMS: Valu.Nieves ] denotes positive finding; [  ] denotes negative finding  CARDIOVASCULAR:  [ ]  chest pain   [ ]  dyspnea on exertion  [ ]  leg swelling  CONSTITUTIONAL:  [ ]  fever   [ ]  chills   OBSERVATIONS/OBJECTIVE: There were no vitals filed for this visit. He is not short  of breath at rest and is alert and oriented.  DATA:  DUPLEX ABDOMINAL AORTA: I have independently interpreted his duplex of the abdominal aorta.  The maximum diameter of his infrarenal aorta is 3.3 cm which is not changed compared to the study 2 years ago.  The right common iliac artery measures 1.1 cm in maximum diameter.  The left common iliac artery measures 1.1 cm in maximum diameter.  ARTERIAL DOPPLER STUDY: I have independently interpreted his arterial Doppler study today.  On the right side there is a triphasic dorsalis pedis and posterior tibial signal.  ABIs 100%.  Toe pressures 105 mmHg.  On the left side there is a triphasic dorsalis pedis and posterior tibial signal.  ABIs 100%.  Toe  pressure 75 mmHg.  MEDICAL ISSUES: A.  3.3 CM INFRARENAL ABDOMINAL AORTIC ANEURYSM: This patient has a very small abdominal aortic aneurysm.  This has not changed over the last 5 years in size.  We have been following this a 2-year intervals and I think we can stretch this out to 3 years.  He is not a smoker.  His blood pressure is under good control.  He does have a history of peripheral vascular disease in the past and is undergone previous right femoral endarterectomy and vein patch angioplasty.  His ABIs are normal and his lower extremity peripheral vascular disease is followed by Dr. Quay Burow.   FOLLOW UP INSTRUCTIONS:   I discussed the assessment and treatment plan with the patient. The patient was provided an opportunity to ask questions and all were answered. The patient agreed with the plan and demonstrated an understanding of the instructions. The patient was advised to call back or seek an in-person evaluation if the symptoms worsen or if the condition fails to improve as anticipated.  I provided 6 minutes of non-face-to-face time during this encounter.   Deitra Mayo [facility]

## 2021-04-13 DIAGNOSIS — Z85828 Personal history of other malignant neoplasm of skin: Secondary | ICD-10-CM | POA: Diagnosis not present

## 2021-04-13 DIAGNOSIS — D485 Neoplasm of uncertain behavior of skin: Secondary | ICD-10-CM | POA: Diagnosis not present

## 2021-04-13 DIAGNOSIS — C44319 Basal cell carcinoma of skin of other parts of face: Secondary | ICD-10-CM | POA: Diagnosis not present

## 2021-04-28 DIAGNOSIS — G4489 Other headache syndrome: Secondary | ICD-10-CM | POA: Diagnosis not present

## 2021-04-28 DIAGNOSIS — M542 Cervicalgia: Secondary | ICD-10-CM | POA: Diagnosis not present

## 2021-06-10 DIAGNOSIS — L821 Other seborrheic keratosis: Secondary | ICD-10-CM | POA: Diagnosis not present

## 2021-06-10 DIAGNOSIS — D234 Other benign neoplasm of skin of scalp and neck: Secondary | ICD-10-CM | POA: Diagnosis not present

## 2021-06-10 DIAGNOSIS — Z85828 Personal history of other malignant neoplasm of skin: Secondary | ICD-10-CM | POA: Diagnosis not present

## 2021-06-10 DIAGNOSIS — L570 Actinic keratosis: Secondary | ICD-10-CM | POA: Diagnosis not present

## 2021-06-18 DIAGNOSIS — I2510 Atherosclerotic heart disease of native coronary artery without angina pectoris: Secondary | ICD-10-CM | POA: Diagnosis not present

## 2021-06-18 DIAGNOSIS — E785 Hyperlipidemia, unspecified: Secondary | ICD-10-CM | POA: Diagnosis not present

## 2021-06-18 DIAGNOSIS — I700 Atherosclerosis of aorta: Secondary | ICD-10-CM | POA: Diagnosis not present

## 2021-06-18 DIAGNOSIS — I10 Essential (primary) hypertension: Secondary | ICD-10-CM | POA: Diagnosis not present

## 2021-06-19 IMAGING — MG DIGITAL DIAGNOSTIC BILAT W/ TOMO W/ CAD
6 of 10 series · 6 of 30 positions shown · non-contrast
Comparison: None.

CLINICAL DATA: 78-year-old male patient describes a new palpable
lump and tenderness in the subareolar RIGHT breast

EXAM:
DIGITAL DIAGNOSTIC BILATERAL MAMMOGRAM WITH TOMO AND CAD

[R MLO synth-2D]
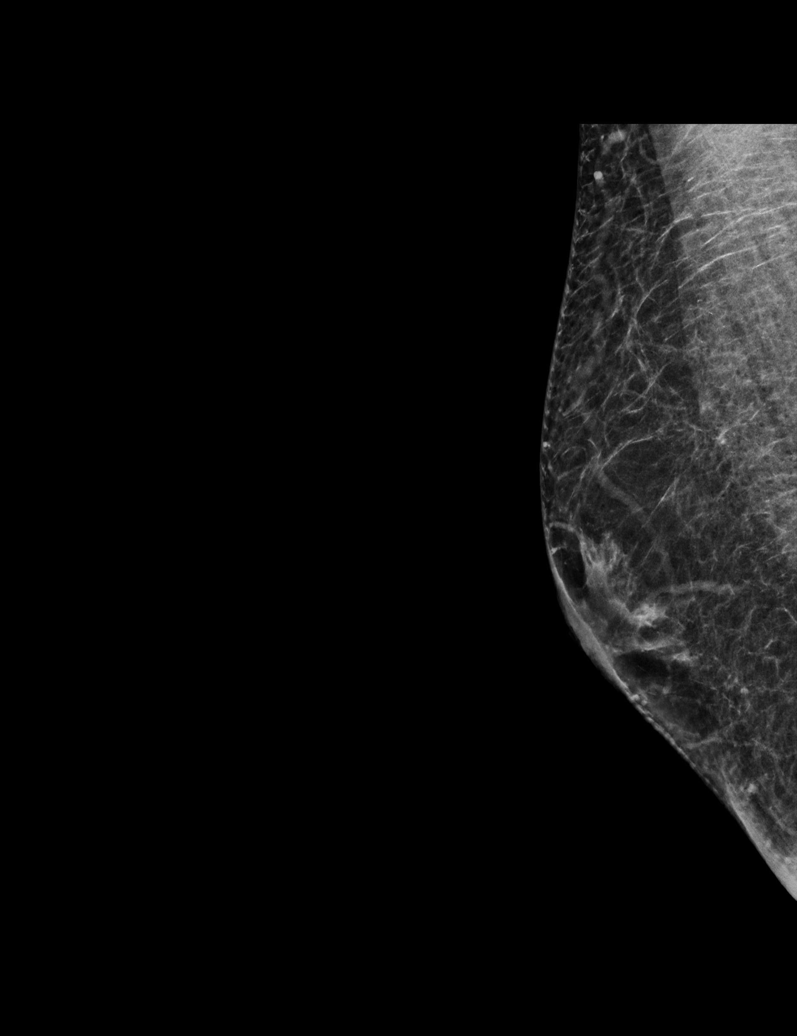

[L MLO synth-2D]
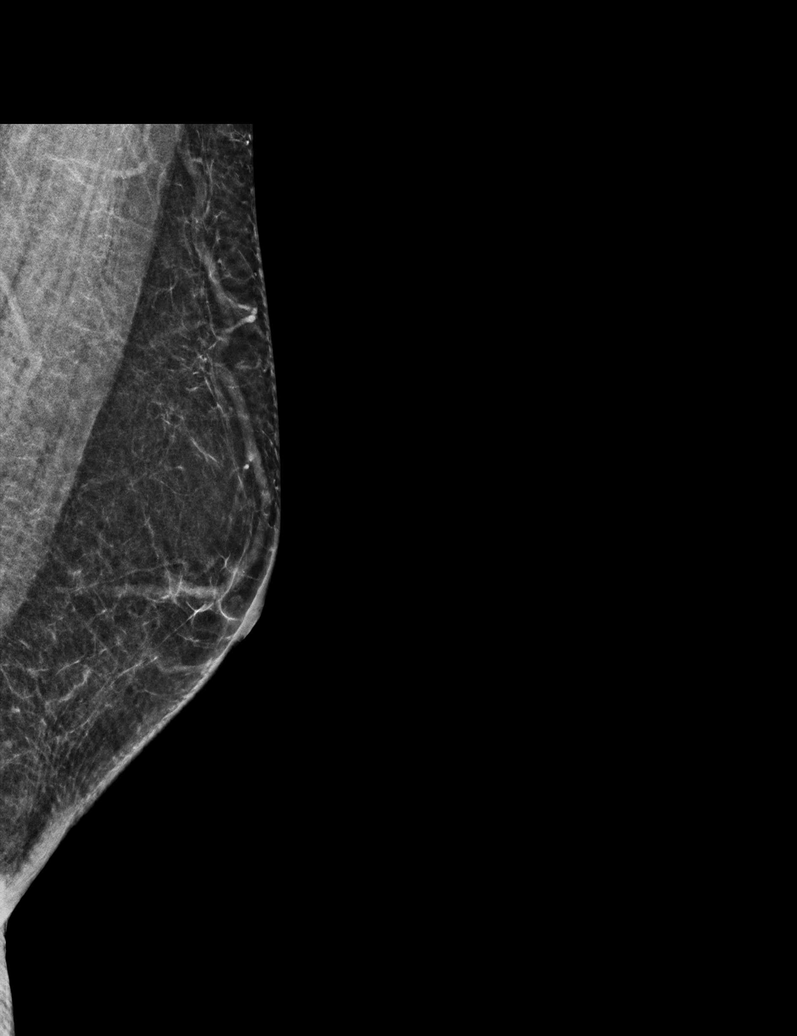

[L CC synth-2D]
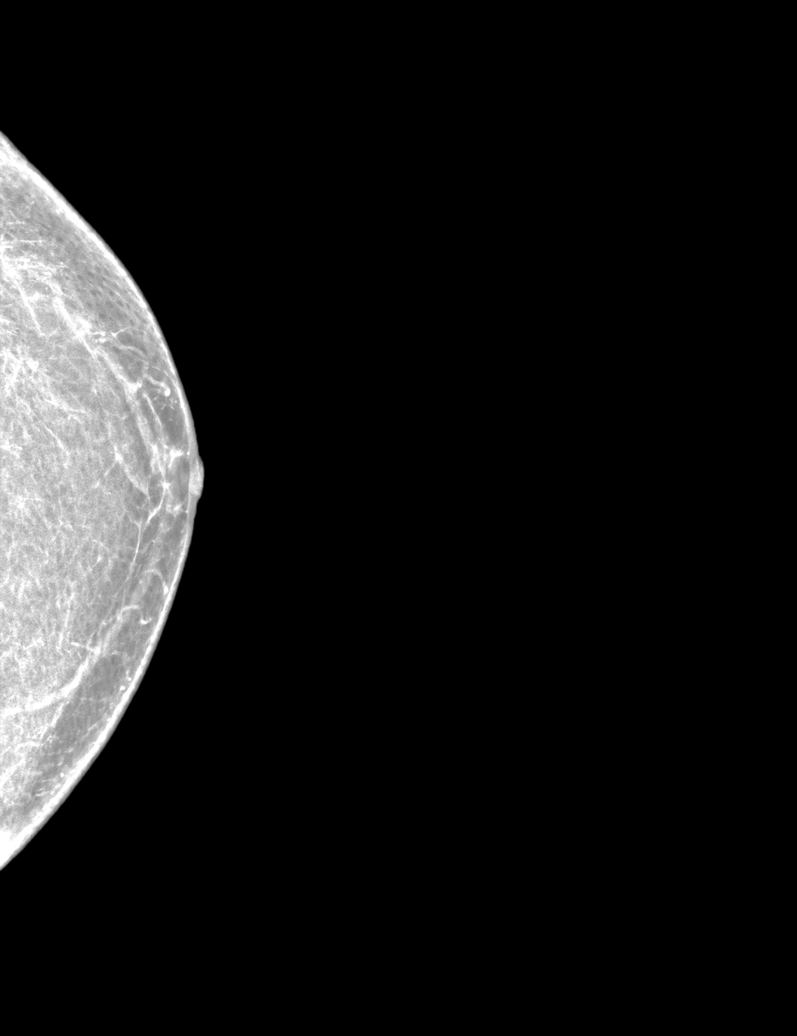

[R CC synth-2D (1 of 2)]
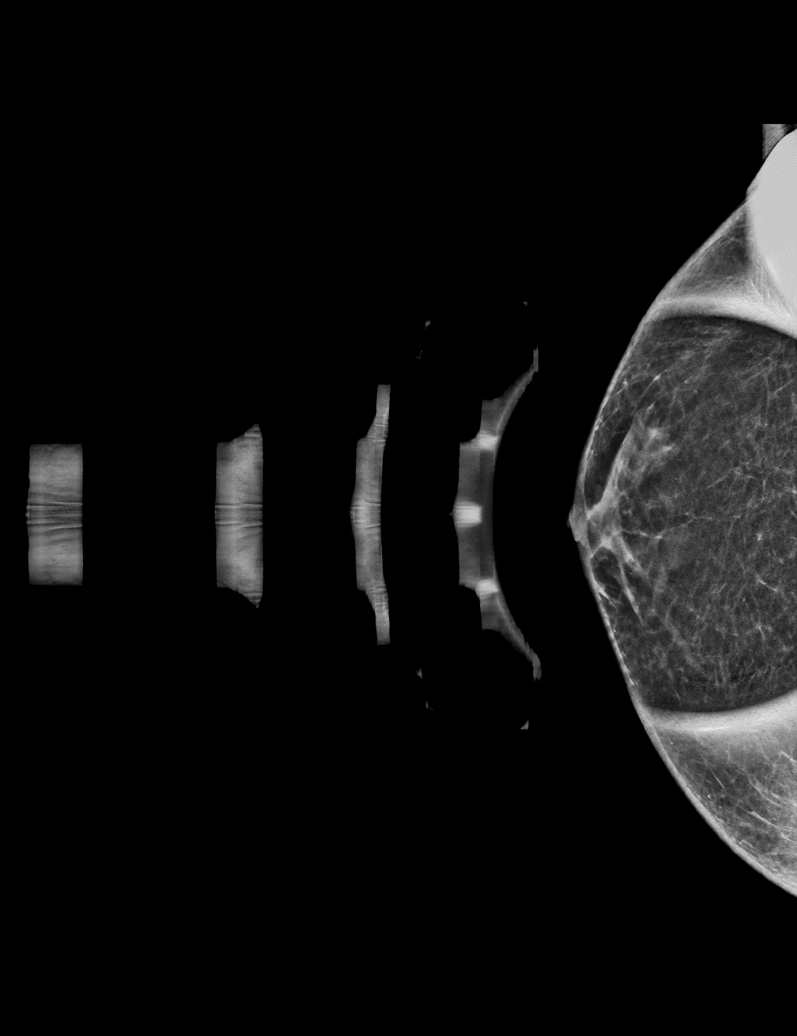

[R CC synth-2D (2 of 2)]
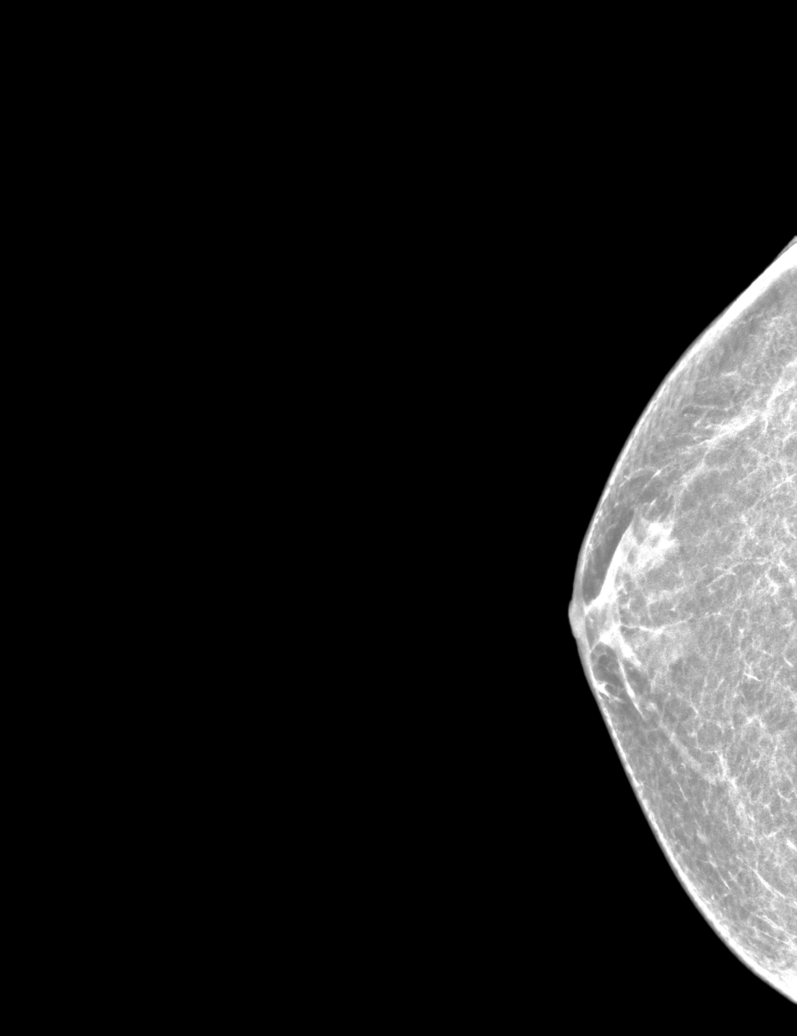

[R CC tomo · tomo slice 25/48.0]
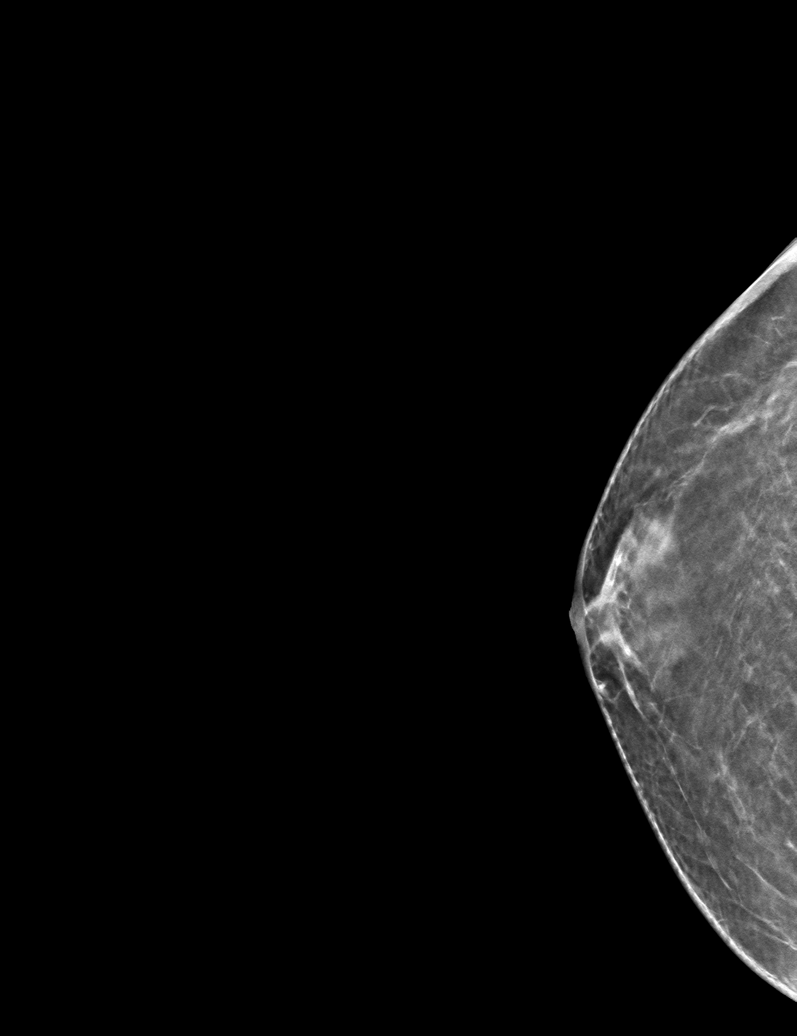

[6 of 30 positions shown; findings below may reference images not displayed]

ACR Breast Density Category b: There are scattered areas of
fibroglandular density.
FINDINGS: There is mild unilateral RIGHT-sided gynecomastia, dendritic type,
corresponding to the area of clinical concern.

There are no dominant masses, suspicious calcifications or secondary
signs of malignancy within either breast.

Mammographic images were processed with CAD.
IMPRESSION: 1. Mild unilateral RIGHT-sided gynecomastia, dendritic type,
corresponding to the area of clinical concern.
2. No evidence of malignancy within either breast.

RECOMMENDATION:
I discussed with the patient the fact that gynecomastia can occur in
older men as testosterone levels decrease with age or in younger men
with low testosterone levels, causing a change in the serum
testosterone:estrogen ratio. We also discussed other potential
etiologies of gynecomastia including numerous prescription
medications and chronic liver disease. The patient was instructed to
return for additional imaging if the area that he feels becomes
larger and/or firmer to palpation, or if a new palpable abnormality
is identified in either breast. We also discussed the possibility of
surgical excision if symptoms continue and if an etiology of the
gynecomastia cannot be determined and therefore corrected.

Patient states that he is currently on a cholesterol medication. If
this cholesterol medication carries a known side effect of
gynecomastia, consider switching patient to a different medication.

Patient also states that he was recently in the hospital for
[id number], with elevated liver enzymes. As above, liver disease can
be a cause for gynecomastia. Would consider correlation with current
liver function tests.

I have discussed the findings and recommendations with the patient.
If applicable, a reminder letter will be sent to the patient
regarding the next appointment.

BI-RADS CATEGORY  2: Benign.

## 2021-06-30 ENCOUNTER — Other Ambulatory Visit: Payer: Self-pay | Admitting: Internal Medicine

## 2021-07-06 DIAGNOSIS — R590 Localized enlarged lymph nodes: Secondary | ICD-10-CM | POA: Diagnosis not present

## 2021-07-06 DIAGNOSIS — H9113 Presbycusis, bilateral: Secondary | ICD-10-CM | POA: Diagnosis not present

## 2021-08-05 ENCOUNTER — Ambulatory Visit: Payer: Medicare HMO | Admitting: Internal Medicine

## 2021-08-05 ENCOUNTER — Encounter: Payer: Self-pay | Admitting: Internal Medicine

## 2021-08-05 VITALS — BP 122/68 | HR 87 | Ht 73.0 in | Wt 207.6 lb

## 2021-08-05 DIAGNOSIS — D225 Melanocytic nevi of trunk: Secondary | ICD-10-CM | POA: Diagnosis not present

## 2021-08-05 DIAGNOSIS — L821 Other seborrheic keratosis: Secondary | ICD-10-CM | POA: Diagnosis not present

## 2021-08-05 DIAGNOSIS — D692 Other nonthrombocytopenic purpura: Secondary | ICD-10-CM | POA: Diagnosis not present

## 2021-08-05 DIAGNOSIS — Z01810 Encounter for preprocedural cardiovascular examination: Secondary | ICD-10-CM | POA: Diagnosis not present

## 2021-08-05 DIAGNOSIS — L570 Actinic keratosis: Secondary | ICD-10-CM | POA: Diagnosis not present

## 2021-08-05 DIAGNOSIS — Z85828 Personal history of other malignant neoplasm of skin: Secondary | ICD-10-CM | POA: Diagnosis not present

## 2021-08-05 DIAGNOSIS — D1801 Hemangioma of skin and subcutaneous tissue: Secondary | ICD-10-CM | POA: Diagnosis not present

## 2021-08-05 DIAGNOSIS — L218 Other seborrheic dermatitis: Secondary | ICD-10-CM | POA: Diagnosis not present

## 2021-08-05 NOTE — Progress Notes (Signed)
Cardiology Office Note:    Date:  08/05/2021   ID:  Manuel Friedman, DOB 02-Sep-1940, MRN [id number]  PCP:  [name], MD   [place] [facility] Cardiologist:  [name], MD     Referring MD: [name], MD   No chief complaint on file. Pre-operative eval  History of Present Illness:    Manuel Friedman is a 81 y.o. male with a hx of PAD follows with Dr. Gwenlyn Found, PCI below 2013, s/p right common femoral endarterectomy with patch angioplasty 04/06/2017, former smoker, HTN, HLD referral for pre-operative evaluation   He notes that he has followed with the practice for awhile. He is currently not taking BP medication. It is well controlled. He feels well. He has a hernia. He walks and goes to the gym. He walks his dog often. He does not report angina, dyspnea on exertion, LE edema,. LH, dizziness, or syncope. He takes atorvastatin. He stopped ASA 2/2 persistent nose bleeding.He recently had COVID 2020 and his blood pressure has dropped, so he is holding his medications.  Interim Hx 08/05/2021  Manuel Friedman presented for a pre-op on his initial visit, he returns post umbilical hernia repair. He feels well.  He tolerated that well. He walks his dog often and has no symptoms.  Cardiology Studies -EKG- NSR RBBB  -01/12/2017: 1. 3.3 cm fusiform infrarenal abdominal aortic aneurysm. Recommend followup by ultrasound in 3 years. This recommendation follows ACR consensus guidelines: White Paper of the ACR Incidental Findings Committee II on Vascular Findings. J Am Coll Radiol 2013; 10:789-794. 2. Tortuous bilateral iliac arterial systems without stenosis or aneurysm. 3. Calcified plaque across the RIGHT common femoral artery bifurcation into the proximal SFA resulting in stenosis of probable hemodynamic significance. Correlate with segmental pressures. 4. Mild eccentric calcified plaque in the LEFT common femoral artery across its bifurcation, without definite significant  stenosis. 5. Contiguous three-vessel tibial runoff bilaterally.  -Cardiac CT performed in January of 2013 revealed less than 40% LAD. There was a less then 40% circumflex. RCA felt to be occluded with distal vessel filling by collaterals. Cardiac catheterization in January of 2013 showed a normal left main. There was a 30% proximal LAD. There was a 20% first diagonal. There was a 20% proximal third obtuse marginal. The right coronary artery was occluded. Ejection fraction was 55%.    Past Medical History:  Diagnosis Date   Adenomatous colon polyp    Arthritis    CAD (coronary artery disease)    s/p cath 03/2011 with total RCA, left to right collaterals and mild nonobstructive disease in the LAD and LCX. Trying medical management but could attempt PCI of the RCA   Cancer [facility])    skin cancer   Cataract    Bil/had surgery   COPD (chronic obstructive pulmonary disease) ([place])    COVID-19 virus infection 11/06/2018   Decreased hearing    wears hearing aid/Bil   Depression    Diverticulosis    Esophageal stricture    Hiatal hernia    HTN (hypertension)    Hyperlipemia    Internal and external hemorrhoids without complication    Lung nodule    Noted on CTA Jan 2013. Needs follow upstudy   Peripheral vascular disease ([place])    Rosacea    Swelling of ankle, right    Umbilical hernia    hx    Past Surgical History:  Procedure Laterality Date   CATARACT EXTRACTION, BILATERAL     COLONOSCOPY W/ BIOPSIES AND POLYPECTOMY  01/30/2007   diverticulosis, internal and external hemorrhoids   ENDARTERECTOMY FEMORAL Right 04/06/2017   Procedure: ENDARTERECTOMY RIGHT COMMON FEMORAL ARTERY;  Surgeon: Angelia Mould, MD;  Location: [place];  Service: Vascular;  Laterality: Right;   INSERTION OF MESH N/A 03/02/2021   Procedure: INSERTION OF MESH;  Surgeon: Donnie Mesa, MD;  Location: [place];  Service: General;  Laterality: N/A;   LOWER EXTREMITY ANGIOGRAPHY N/A 03/27/2017    Procedure: LOWER EXTREMITY ANGIOGRAPHY;  Surgeon: Lorretta Harp, MD;  Location: [place] [facility];  Service: Cardiovascular;  Laterality: N/A;   PATCH ANGIOPLASTY Right 04/06/2017   Procedure: PATCH ANGIOPLASTY USING RIGHT GREATER SAPHENOUS VEIN ON RIGHT COMMON FEMORAL ARTERY;  Surgeon: Angelia Mould, MD;  Location: [place];  Service: Vascular;  Laterality: Right;   TONSILLECTOMY     UMBILICAL HERNIA REPAIR N/A 03/02/2021   Procedure: OPEN UMBILICAL HERNIA REPAIR WITH MESH;  Surgeon: Donnie Mesa, MD;  Location: [place];  Service: General;  Laterality: N/A;   UPPER GASTROINTESTINAL ENDOSCOPY  02/19/2009   stricture, GERD, Hiatal hernia    Current Medications: Current Outpatient Medications on File Prior to Visit  Medication Sig Dispense Refill   atorvastatin (LIPITOR) 80 MG tablet Take 80 mg by mouth daily.     cetirizine (ZYRTEC) 10 MG tablet Take 10 mg by mouth as needed for allergies.      cholecalciferol (VITAMIN D3) 25 MCG (1000 UNIT) tablet Take 1,000 Units by mouth daily.     Coenzyme Q10 (COQ-10 PO) Take 1 tablet by mouth daily.     escitalopram (LEXAPRO) 20 MG tablet Take 20 mg by mouth daily.     HYDROcodone-acetaminophen (NORCO/VICODIN) 5-325 MG tablet Take 1 tablet by mouth every 6 (six) hours as needed for moderate pain. 15 tablet 0   magnesium oxide (MAG-OX) 400 MG tablet Take 400 mg by mouth daily.     Multiple Vitamin (MULTIVITAMIN WITH MINERALS) TABS tablet Take 1 tablet by mouth daily.     naproxen sodium (ALEVE) 220 MG tablet Take 220 mg by mouth daily as needed (pain).     pantoprazole (PROTONIX) 20 MG tablet TAKE 1 TABLET EVERY DAY BEFORE BREAKFAST (NEED MD APPOINTMENT) 90 tablet 0   vitamin B-12 (CYANOCOBALAMIN) 500 MCG tablet Take 500 mcg by mouth daily.     zinc gluconate 50 MG tablet Take 50 mg by mouth daily.     Current Facility-Administered Medications on File Prior to Visit  Medication Dose Route Frequency Provider Last Rate Last  Admin   0.9 %  sodium chloride infusion  500 mL Intravenous Once Gatha Mayer, MD         Allergies:   Cyclobenzaprine and Metoprolol   Social History   Socioeconomic History   Marital status: Married    Spouse name: Not on file   Number of children: 2   Years of education: Not on file   Highest education level: Not on file  Occupational History   Not on file  Tobacco Use   Smoking status: Former    Types: Cigarettes    Quit date: 02/29/1992    Years since quitting: 29.4   Smokeless tobacco: Never  Vaping Use   Vaping Use: Never used  Substance and Sexual Activity   Alcohol use: Yes    Alcohol/week: 7.0 standard drinks    Types: 7 Standard drinks or equivalent per week   Drug use: No   Sexual activity: Yes  Other Topics Concern  Not on file  Social History Narrative   Married and retired   2 adult children   Former smoker, 1 drink a day no drugs   Social Determinants of Radio broadcast assistant Strain: Not on file  Food Insecurity: Not on file  Transportation Needs: Not on file  Physical Activity: Not on file  Stress: Not on file  Social Connections: Not on file     Family History: The patient's family history includes Multiple myeloma in his father; Pancreatic cancer in his mother. There is no history of Colon cancer, Stomach cancer, Esophageal cancer, or Rectal cancer.  ROS:   Please see the history of present illness.     All other systems reviewed and are negative.  EKGs/Labs/Other Studies Reviewed:    The following studies were reviewed today:   EKG:  EKG is  ordered today.  The ekg ordered today demonstrates   NSR, RBBB  08/05/2021-NSR, RBBB  Recent Labs: No results found for requested labs within last 8760 hours.  Recent Lipid Panel    Component Value Date/Time   CHOL 78 11/09/2018 0423   CHOL 155 01/09/2017 0933   TRIG 107 11/09/2018 0423   HDL 12 (L) 11/09/2018 0423   HDL 67 01/09/2017 0933   CHOLHDL 6.5 11/09/2018 0423    VLDL 21 11/09/2018 0423   LDLCALC 45 11/09/2018 0423   LDLCALC 72 01/09/2017 0933     Risk Assessment/Calculations:           Physical Exam:    VS  Vitals:   08/05/21 1359  BP: 122/68  Pulse: 87  SpO2: 97%     Wt Readings from Last 3 Encounters:  03/02/21 217 lb 9.5 oz (98.7 kg)  01/28/21 206 lb 3.2 oz (93.5 kg)  03/13/19 202 lb 8 oz (91.9 kg)     GEN:  Well nourished, well developed in no acute distress HEENT: Normal NECK: No JVD; No carotid bruits LYMPHATICS: No lymphadenopathy CARDIAC: RRR, no murmurs, rubs, gallops RESPIRATORY:  Clear to auscultation without rales, wheezing or rhonchi  ABDOMEN: Soft, non-tender, non-distended MUSCULOSKELETAL:  No edema; No deformity  SKIN: Warm and dry NEUROLOGIC:  Alert and oriented x 3 PSYCHIATRIC:  Normal affect   ASSESSMENT:    #PAD:s/p right common femoral endarterectomy. Continue atorvastatin 80 mg daily. Followed by Dr. Scot Dock  #HTN: Ok to hold blood pressure medications. His blood pressures are normal. Will defer to Dr. Joylene Draft  Abdominal Aortic Aneurysm: Followed by Dr. Scot Dock. His aneurysm is small and can be followed q3 years.   PLAN:    In order of problems listed above:   Follow-up PRN      Medication Adjustments/Labs and Tests Ordered: Current medicines are reviewed at length with the patient today.  Concerns regarding medicines are outlined above.    Signed, [name], MD  08/05/2021 1:58 PM    [facility]

## 2021-08-05 NOTE — Patient Instructions (Signed)
Medication Instructions:  Your physician recommends that you continue on your current medications as directed. Please refer to the Current Medication list given.  Follow-Up: At [facility], you and your health needs are our priority.  As part of our continuing mission to provide you with exceptional heart care, we have created designated Provider Care Teams.  These Care Teams include your primary Cardiologist (physician) and Advanced Practice Providers (APPs -  Physician Assistants and Nurse Practitioners) who all work together to provide you with the care you need, when you need it.  We recommend signing up for the patient portal called "MyChart".  Sign up information is provided on this After Visit Summary.  MyChart is used to connect with patients for Virtual Visits (Telemedicine).  Patients are able to view lab/test results, encounter notes, upcoming appointments, etc.  Non-urgent messages can be sent to your provider as well.   To learn more about what you can do with MyChart, go to [email or web address].    Your next appointment:   AS NEEDED with Dr. Harl Bowie  Important Information About Sugar

## 2021-09-13 DIAGNOSIS — R590 Localized enlarged lymph nodes: Secondary | ICD-10-CM | POA: Diagnosis not present

## 2021-11-08 ENCOUNTER — Ambulatory Visit: Payer: Medicare HMO | Admitting: Physician Assistant

## 2021-11-08 ENCOUNTER — Encounter: Payer: Self-pay | Admitting: Physician Assistant

## 2021-11-08 DIAGNOSIS — G8929 Other chronic pain: Secondary | ICD-10-CM

## 2021-11-08 DIAGNOSIS — M25512 Pain in left shoulder: Secondary | ICD-10-CM | POA: Diagnosis not present

## 2021-11-08 MED ORDER — METHYLPREDNISOLONE ACETATE 40 MG/ML IJ SUSP
40.0000 mg | INTRAMUSCULAR | Status: AC | PRN
  Administered 2021-11-08: 40 mg via INTRA_ARTICULAR

## 2021-11-08 MED ORDER — LIDOCAINE HCL 1 % IJ SOLN
3.0000 mL | INTRAMUSCULAR | Status: AC | PRN
  Administered 2021-11-08: 3 mL

## 2021-11-08 NOTE — Progress Notes (Signed)
   Procedure Note  Patient: Manuel Friedman             Date of Birth: 1941/02/28           MRN: [id number]             Visit Date: 11/08/2021 HPI: Manuel Friedman comes in today with left shoulder pain.  He was last seen by Dr. Delilah Shan on 07/15/2020 and was given left shoulder subacromial injection for chronic left shoulder pain.  He states she did well until about 3 weeks ago when a very "overdid it".  No falls no injuries.  Denies any numbness tingling down the arm.  He has been using over-the-counter patches and Aleve for the pain in the shoulder.  He is diabetic but reports good control of his diabetes.  Review of systems: See HPI otherwise negative or noncontributory.  Physical exam: General well-developed well-nourished male no acute distress.  Mood and affect appropriate.  Ambulates without any assistive device. Left shoulder: Full overhead activity.  He has 5 out of 5 strength with external and internal rotation against resistance bilateral shoulders.  Empty can test is negative bilaterally.  Procedures: Visit Diagnoses:  1. Chronic left shoulder pain     Large Joint Inj: L subacromial bursa on 11/08/2021 4:03 PM Indications: pain Details: 22 G 1.5 in needle, superior approach  Arthrogram: No  Medications: 3 mL lidocaine 1 %; 40 mg methylPREDNISolone acetate 40 MG/ML Outcome: tolerated well, no immediate complications Procedure, treatment alternatives, risks and benefits explained, specific risks discussed. Consent was given by the patient. Immediately prior to procedure a time out was called to verify the correct patient, procedure, equipment, support staff and site/side marked as required. Patient was prepped and draped in the usual sterile fashion.     Plan: He will follow-up with [place] as needed he knows to watch his sugars closely over the next 24 hours.  He also understands to wait at least 3 months between injections.  Questions encouraged and answered

## 2021-12-21 DIAGNOSIS — R339 Retention of urine, unspecified: Secondary | ICD-10-CM | POA: Diagnosis not present

## 2021-12-31 ENCOUNTER — Other Ambulatory Visit: Payer: Self-pay | Admitting: Internal Medicine

## 2022-01-01 DIAGNOSIS — Z23 Encounter for immunization: Secondary | ICD-10-CM | POA: Diagnosis not present

## 2022-01-24 DIAGNOSIS — R3915 Urgency of urination: Secondary | ICD-10-CM | POA: Diagnosis not present

## 2022-01-24 DIAGNOSIS — R351 Nocturia: Secondary | ICD-10-CM | POA: Diagnosis not present

## 2022-01-24 DIAGNOSIS — R350 Frequency of micturition: Secondary | ICD-10-CM | POA: Diagnosis not present

## 2022-01-26 DIAGNOSIS — Z1212 Encounter for screening for malignant neoplasm of rectum: Secondary | ICD-10-CM | POA: Diagnosis not present

## 2022-01-26 DIAGNOSIS — R7301 Impaired fasting glucose: Secondary | ICD-10-CM | POA: Diagnosis not present

## 2022-01-26 DIAGNOSIS — Z125 Encounter for screening for malignant neoplasm of prostate: Secondary | ICD-10-CM | POA: Diagnosis not present

## 2022-01-26 DIAGNOSIS — E785 Hyperlipidemia, unspecified: Secondary | ICD-10-CM | POA: Diagnosis not present

## 2022-01-26 DIAGNOSIS — E291 Testicular hypofunction: Secondary | ICD-10-CM | POA: Diagnosis not present

## 2022-01-26 DIAGNOSIS — I10 Essential (primary) hypertension: Secondary | ICD-10-CM | POA: Diagnosis not present

## 2022-01-26 DIAGNOSIS — E299 Testicular dysfunction, unspecified: Secondary | ICD-10-CM | POA: Diagnosis not present

## 2022-01-26 DIAGNOSIS — E538 Deficiency of other specified B group vitamins: Secondary | ICD-10-CM | POA: Diagnosis not present

## 2022-01-26 DIAGNOSIS — R7989 Other specified abnormal findings of blood chemistry: Secondary | ICD-10-CM | POA: Diagnosis not present

## 2022-02-02 DIAGNOSIS — Z0000 Encounter for general adult medical examination without abnormal findings: Secondary | ICD-10-CM | POA: Diagnosis not present

## 2022-02-02 DIAGNOSIS — R918 Other nonspecific abnormal finding of lung field: Secondary | ICD-10-CM | POA: Diagnosis not present

## 2022-02-02 DIAGNOSIS — I679 Cerebrovascular disease, unspecified: Secondary | ICD-10-CM | POA: Diagnosis not present

## 2022-02-02 DIAGNOSIS — E538 Deficiency of other specified B group vitamins: Secondary | ICD-10-CM | POA: Diagnosis not present

## 2022-02-02 DIAGNOSIS — Z23 Encounter for immunization: Secondary | ICD-10-CM | POA: Diagnosis not present

## 2022-02-02 DIAGNOSIS — I10 Essential (primary) hypertension: Secondary | ICD-10-CM | POA: Diagnosis not present

## 2022-02-02 DIAGNOSIS — I2510 Atherosclerotic heart disease of native coronary artery without angina pectoris: Secondary | ICD-10-CM | POA: Diagnosis not present

## 2022-02-02 DIAGNOSIS — R82998 Other abnormal findings in urine: Secondary | ICD-10-CM | POA: Diagnosis not present

## 2022-02-02 DIAGNOSIS — I700 Atherosclerosis of aorta: Secondary | ICD-10-CM | POA: Diagnosis not present

## 2022-02-02 DIAGNOSIS — J449 Chronic obstructive pulmonary disease, unspecified: Secondary | ICD-10-CM | POA: Diagnosis not present

## 2022-02-02 DIAGNOSIS — I739 Peripheral vascular disease, unspecified: Secondary | ICD-10-CM | POA: Diagnosis not present

## 2022-02-07 DIAGNOSIS — L821 Other seborrheic keratosis: Secondary | ICD-10-CM | POA: Diagnosis not present

## 2022-02-07 DIAGNOSIS — D692 Other nonthrombocytopenic purpura: Secondary | ICD-10-CM | POA: Diagnosis not present

## 2022-02-07 DIAGNOSIS — Z85828 Personal history of other malignant neoplasm of skin: Secondary | ICD-10-CM | POA: Diagnosis not present

## 2022-02-07 DIAGNOSIS — D485 Neoplasm of uncertain behavior of skin: Secondary | ICD-10-CM | POA: Diagnosis not present

## 2022-02-07 DIAGNOSIS — L570 Actinic keratosis: Secondary | ICD-10-CM | POA: Diagnosis not present

## 2022-02-14 ENCOUNTER — Ambulatory Visit: Payer: Medicare HMO | Admitting: Physician Assistant

## 2022-02-14 ENCOUNTER — Encounter: Payer: Self-pay | Admitting: Physician Assistant

## 2022-02-14 DIAGNOSIS — G8929 Other chronic pain: Secondary | ICD-10-CM

## 2022-02-14 DIAGNOSIS — M25512 Pain in left shoulder: Secondary | ICD-10-CM

## 2022-02-14 DIAGNOSIS — R040 Epistaxis: Secondary | ICD-10-CM | POA: Diagnosis not present

## 2022-02-14 MED ORDER — METHYLPREDNISOLONE ACETATE 40 MG/ML IJ SUSP
40.0000 mg | INTRAMUSCULAR | Status: AC | PRN
  Administered 2022-02-14: 40 mg via INTRA_ARTICULAR

## 2022-02-14 MED ORDER — LIDOCAINE HCL 1 % IJ SOLN
3.0000 mL | INTRAMUSCULAR | Status: AC | PRN
  Administered 2022-02-14: 3 mL

## 2022-02-14 NOTE — Progress Notes (Signed)
   Procedure Note  Patient: Manuel Friedman             Date of Birth: 1940/09/02           MRN: [id number]             Visit Date: 02/14/2022 HPI: Mr. Giammarino returns today for left shoulder pain.  He was last seen on 11/08/2021.  At that time it was indicated that he is diabetic.  He is nondiabetic.  He does report that he recently saw his primary care physician about a week to 2 weeks ago and that all his labs were "good".  In regards to his left shoulder he states that he has had no recent injuries.  He does note that the last injection helped he is unsure for how long.  He states pain in the shoulder comes and goes and he cannot recreate it with any particular motion.  He denies any numbness tingling down the arm.  Does have some neck pain but states is only as he does his exercises for his neck and that he has no significant pain in the neck.  Review of systems: See HPI otherwise negative  Physical exam: General: No acute distress mood affect appropriate. Bilateral shoulders: Full active range of motion overhead without pain.  Mild impingement left shoulder only.  5 5 strength throughout rotator cuff against resistance. Cervical spine good range of motion without pain.  Nontender over the medial border of scapula over the paraspinous region of the cervical spine. Procedures: Visit Diagnoses:  1. Chronic left shoulder pain     Large Joint Inj on 02/14/2022 10:49 AM Indications: pain Details: 22 G 1.5 in needle, lateral approach  Arthrogram: No  Medications: 3 mL lidocaine 1 %; 40 mg methylPREDNISolone acetate 40 MG/ML Outcome: tolerated well, no immediate complications Procedure, treatment alternatives, risks and benefits explained, specific risks discussed. Consent was given by the patient. Immediately prior to procedure a time out was called to verify the correct patient, procedure, equipment, support staff and site/side marked as required. Patient was prepped and draped in the  usual sterile fashion.    Plan: We will see how he does with the injection.  He will let us know in 2 weeks if he feels that he got relief with the injection.  He understands to wait least 3 months between injections.  Questions were encouraged and answered

## 2022-03-23 DIAGNOSIS — R3915 Urgency of urination: Secondary | ICD-10-CM | POA: Diagnosis not present

## 2022-03-23 DIAGNOSIS — D3131 Benign neoplasm of right choroid: Secondary | ICD-10-CM | POA: Diagnosis not present

## 2022-03-23 DIAGNOSIS — H43813 Vitreous degeneration, bilateral: Secondary | ICD-10-CM | POA: Diagnosis not present

## 2022-03-23 DIAGNOSIS — R350 Frequency of micturition: Secondary | ICD-10-CM | POA: Diagnosis not present

## 2022-03-23 DIAGNOSIS — H524 Presbyopia: Secondary | ICD-10-CM | POA: Diagnosis not present

## 2022-03-23 DIAGNOSIS — H5213 Myopia, bilateral: Secondary | ICD-10-CM | POA: Diagnosis not present

## 2022-05-24 DIAGNOSIS — H903 Sensorineural hearing loss, bilateral: Secondary | ICD-10-CM | POA: Diagnosis not present

## 2022-05-26 ENCOUNTER — Encounter: Payer: Self-pay | Admitting: Radiology

## 2022-06-23 DIAGNOSIS — H903 Sensorineural hearing loss, bilateral: Secondary | ICD-10-CM | POA: Diagnosis not present

## 2022-06-27 ENCOUNTER — Ambulatory Visit: Payer: Medicare HMO | Admitting: Physician Assistant

## 2022-06-27 ENCOUNTER — Encounter: Payer: Self-pay | Admitting: Physician Assistant

## 2022-06-27 DIAGNOSIS — G8929 Other chronic pain: Secondary | ICD-10-CM | POA: Diagnosis not present

## 2022-06-27 DIAGNOSIS — M25512 Pain in left shoulder: Secondary | ICD-10-CM

## 2022-06-27 MED ORDER — METHYLPREDNISOLONE ACETATE 40 MG/ML IJ SUSP
40.0000 mg | INTRAMUSCULAR | Status: AC | PRN
  Administered 2022-06-27: 40 mg via INTRA_ARTICULAR

## 2022-06-27 MED ORDER — LIDOCAINE HCL 1 % IJ SOLN
3.0000 mL | INTRAMUSCULAR | Status: AC | PRN
  Administered 2022-06-27: 3 mL

## 2022-06-27 NOTE — Progress Notes (Signed)
   Procedure Note  Patient: Manuel Friedman             Date of Birth: [birth year]           MRN: [id number]             Visit Date: 06/27/2022 HPI: Mr. Roper comes in today with left shoulder pain.  He states the last injection on 11/08/2021 gave him good relief until recently.  He had no injury to the shoulder.  States pain is still stiff after doing yard work.  Pain is mostly anterior aspect shoulder.  He is requesting a repeat injection left shoulder today.  Review of systems: Denies any fevers or chills.  Physical exam: General no acute distress mood affect appropriate Bilateral shoulders: 5 out of 5 strength with external and internal rotation against resistance.  Empty can test negative bilaterally.  Impingement testing positive on the left only.  Biceps no evidence of biceps rupture.  Slight tenderness over the left bicipital groove region but no pain over the same area on the right.  Abduction test causes slight discomfort on the left negative on the right.  He has full overhead activity of both shoulders without pain.  Procedures: Visit Diagnoses:  1. Chronic left shoulder pain     Large Joint Inj: L subacromial bursa on 06/27/2022 9:35 AM Indications: pain Details: 22 G 1.5 in needle, superior approach  Arthrogram: No  Medications: 3 mL lidocaine 1 %; 40 mg methylPREDNISolone acetate 40 MG/ML Outcome: tolerated well, no immediate complications Procedure, treatment alternatives, risks and benefits explained, specific risks discussed. Consent was given by the patient. Immediately prior to procedure a time out was called to verify the correct patient, procedure, equipment, support staff and site/side marked as required. Patient was prepped and draped in the usual sterile fashion.     Plan: He is given shoulder exercises handouts and Thera-Band reviewed shoulder exercises with him.  He knows to wait at least 3 months between injections.  Follow-up as as-needed basis pain persist  or comes worse.  Questions encouraged and answered.

## 2022-07-04 DIAGNOSIS — Z85828 Personal history of other malignant neoplasm of skin: Secondary | ICD-10-CM | POA: Diagnosis not present

## 2022-07-04 DIAGNOSIS — L821 Other seborrheic keratosis: Secondary | ICD-10-CM | POA: Diagnosis not present

## 2022-07-04 DIAGNOSIS — L570 Actinic keratosis: Secondary | ICD-10-CM | POA: Diagnosis not present

## 2022-07-04 DIAGNOSIS — L300 Nummular dermatitis: Secondary | ICD-10-CM | POA: Diagnosis not present

## 2022-07-04 DIAGNOSIS — D485 Neoplasm of uncertain behavior of skin: Secondary | ICD-10-CM | POA: Diagnosis not present

## 2022-07-04 DIAGNOSIS — D692 Other nonthrombocytopenic purpura: Secondary | ICD-10-CM | POA: Diagnosis not present

## 2022-09-29 DIAGNOSIS — I714 Abdominal aortic aneurysm, without rupture, unspecified: Secondary | ICD-10-CM | POA: Diagnosis not present

## 2022-09-29 DIAGNOSIS — I10 Essential (primary) hypertension: Secondary | ICD-10-CM | POA: Diagnosis not present

## 2022-09-29 DIAGNOSIS — I2510 Atherosclerotic heart disease of native coronary artery without angina pectoris: Secondary | ICD-10-CM | POA: Diagnosis not present

## 2022-09-29 DIAGNOSIS — R7301 Impaired fasting glucose: Secondary | ICD-10-CM | POA: Diagnosis not present

## 2022-09-29 DIAGNOSIS — I739 Peripheral vascular disease, unspecified: Secondary | ICD-10-CM | POA: Diagnosis not present

## 2022-09-29 DIAGNOSIS — E538 Deficiency of other specified B group vitamins: Secondary | ICD-10-CM | POA: Diagnosis not present

## 2022-09-29 DIAGNOSIS — J449 Chronic obstructive pulmonary disease, unspecified: Secondary | ICD-10-CM | POA: Diagnosis not present

## 2022-09-29 DIAGNOSIS — E785 Hyperlipidemia, unspecified: Secondary | ICD-10-CM | POA: Diagnosis not present

## 2022-10-06 ENCOUNTER — Other Ambulatory Visit (HOSPITAL_COMMUNITY): Payer: Self-pay | Admitting: Internal Medicine

## 2022-10-06 ENCOUNTER — Ambulatory Visit (HOSPITAL_COMMUNITY)
Admission: RE | Admit: 2022-10-06 | Discharge: 2022-10-06 | Disposition: A | Discharge status: Home / Self Care | Payer: Medicare HMO | Source: Ambulatory Visit | Attending: Vascular Surgery | Admitting: Vascular Surgery

## 2022-10-06 DIAGNOSIS — Z8679 Personal history of other diseases of the circulatory system: Secondary | ICD-10-CM | POA: Insufficient documentation

## 2022-12-15 DIAGNOSIS — L820 Inflamed seborrheic keratosis: Secondary | ICD-10-CM | POA: Diagnosis not present

## 2022-12-15 DIAGNOSIS — L821 Other seborrheic keratosis: Secondary | ICD-10-CM | POA: Diagnosis not present

## 2022-12-15 DIAGNOSIS — C4441 Basal cell carcinoma of skin of scalp and neck: Secondary | ICD-10-CM | POA: Diagnosis not present

## 2022-12-15 DIAGNOSIS — L570 Actinic keratosis: Secondary | ICD-10-CM | POA: Diagnosis not present

## 2022-12-15 DIAGNOSIS — C44319 Basal cell carcinoma of skin of other parts of face: Secondary | ICD-10-CM | POA: Diagnosis not present

## 2022-12-15 DIAGNOSIS — D485 Neoplasm of uncertain behavior of skin: Secondary | ICD-10-CM | POA: Diagnosis not present

## 2022-12-15 DIAGNOSIS — Z85828 Personal history of other malignant neoplasm of skin: Secondary | ICD-10-CM | POA: Diagnosis not present

## 2022-12-24 DIAGNOSIS — Z23 Encounter for immunization: Secondary | ICD-10-CM | POA: Diagnosis not present

## 2023-01-02 DIAGNOSIS — Z85828 Personal history of other malignant neoplasm of skin: Secondary | ICD-10-CM | POA: Diagnosis not present

## 2023-01-02 DIAGNOSIS — C44319 Basal cell carcinoma of skin of other parts of face: Secondary | ICD-10-CM | POA: Diagnosis not present

## 2023-01-13 ENCOUNTER — Encounter (HOSPITAL_BASED_OUTPATIENT_CLINIC_OR_DEPARTMENT_OTHER): Payer: Self-pay | Admitting: *Deleted

## 2023-01-13 ENCOUNTER — Emergency Department (HOSPITAL_BASED_OUTPATIENT_CLINIC_OR_DEPARTMENT_OTHER)
Admission: EM | Admit: 2023-01-13 | Discharge: 2023-01-13 | Disposition: A | Discharge status: Home / Self Care | Payer: Medicare HMO | Attending: Emergency Medicine | Admitting: Emergency Medicine

## 2023-01-13 ENCOUNTER — Other Ambulatory Visit: Payer: Self-pay

## 2023-01-13 DIAGNOSIS — I10 Essential (primary) hypertension: Secondary | ICD-10-CM | POA: Diagnosis not present

## 2023-01-13 DIAGNOSIS — Z85820 Personal history of malignant melanoma of skin: Secondary | ICD-10-CM | POA: Diagnosis not present

## 2023-01-13 DIAGNOSIS — Z8616 Personal history of COVID-19: Secondary | ICD-10-CM | POA: Insufficient documentation

## 2023-01-13 DIAGNOSIS — J449 Chronic obstructive pulmonary disease, unspecified: Secondary | ICD-10-CM | POA: Insufficient documentation

## 2023-01-13 DIAGNOSIS — I2510 Atherosclerotic heart disease of native coronary artery without angina pectoris: Secondary | ICD-10-CM | POA: Diagnosis not present

## 2023-01-13 DIAGNOSIS — R040 Epistaxis: Secondary | ICD-10-CM | POA: Insufficient documentation

## 2023-01-13 MED ORDER — OXYMETAZOLINE HCL 0.05 % NA SOLN
1.0000 | Freq: Once | NASAL | Status: AC
  Administered 2023-01-13: 1 via NASAL
  Filled 2023-01-13: qty 30

## 2023-01-13 NOTE — ED Triage Notes (Signed)
Pt is here for nosebleed from left nare which began after blowing his nose this am

## 2023-01-13 NOTE — ED Provider Notes (Signed)
[place] EMERGENCY DEPARTMENT AT [facility] Provider Note   CSN: [id number] Arrival date & time: 01/13/23  7425     History  Chief Complaint  Patient presents with   Epistaxis    Manuel Friedman is a 82 y.o. male.  HPI     82 year old male with history of COPD, hypertension, depression, BPH, hyperlipidemia, AAA, prior epistaxis, who presents with concern for epistaxis.   Saw Dr. Pollyann Kennedy in November 2023.  Prior to that he had to be cauterized in the ED.  Holds ice and rag over nose when it occurs and pressure on left side. It always occurs on the left side including today. Today it lasted about 30 minutes and has now stopped. Began after he blew his nose as he was getting ready for the day> Frequently wakes up with congestion.  Has saline spray.  Not on blood thinners.    Past Medical History:  Diagnosis Date   Adenomatous colon polyp    Arthritis    CAD (coronary artery disease)    s/p cath 03/2011 with total RCA, left to right collaterals and mild nonobstructive disease in the LAD and LCX. Trying medical management but could attempt PCI of the RCA   Cancer [facility])    skin cancer   Cataract    Bil/had surgery   COPD (chronic obstructive pulmonary disease) ([facility])    COVID-19 virus infection 11/06/2018   Decreased hearing    wears hearing aid/Bil   Depression    Diverticulosis    Esophageal stricture    Hiatal hernia    HTN (hypertension)    Hyperlipemia    Internal and external hemorrhoids without complication    Lung nodule    Noted on CTA Jan 2013. Needs follow upstudy   Peripheral vascular disease ([facility])    Rosacea    Swelling of ankle, right    Umbilical hernia    hx    Home Medications Prior to Admission medications   Medication Sig Start Date End Date Taking? Authorizing Provider  atorvastatin (LIPITOR) 80 MG tablet Take 80 mg by mouth daily. 01/18/19   [provider]  cetirizine (ZYRTEC) 10 MG tablet Take 10 mg by mouth as needed  for allergies.     [provider]  cholecalciferol (VITAMIN D3) 25 MCG (1000 UNIT) tablet Take 1,000 Units by mouth daily.    [provider]  Coenzyme Q10 (COQ-10 PO) Take 1 tablet by mouth daily.    [provider]  escitalopram (LEXAPRO) 20 MG tablet Take 20 mg by mouth daily. 01/22/21   [provider]  HYDROcodone-acetaminophen (NORCO/VICODIN) 5-325 MG tablet Take 1 tablet by mouth every 6 (six) hours as needed for moderate pain. 03/02/21   Manus Rudd, MD  magnesium oxide (MAG-OX) 400 MG tablet Take 400 mg by mouth daily.    [provider]  Multiple Vitamin (MULTIVITAMIN WITH MINERALS) TABS tablet Take 1 tablet by mouth daily.    [provider]  naproxen sodium (ALEVE) 220 MG tablet Take 220 mg by mouth daily as needed (pain).    [provider]  pantoprazole (PROTONIX) 20 MG tablet TAKE 1 TABLET EVERY DAY BEFORE BREAKFAST (NEED MD APPOINTMENT) 06/30/21   Iva Boop, MD  vitamin B-12 (CYANOCOBALAMIN) 500 MCG tablet Take 500 mcg by mouth daily.    [provider]  zinc gluconate 50 MG tablet Take 50 mg by mouth daily.    [provider]      Allergies  Cyclobenzaprine and Metoprolol    Review of Systems   Review of Systems  Physical Exam Updated Vital Signs BP (!) 144/78 (BP Location: Right Arm)   Pulse 73   Temp (!) 97.5 F (36.4 C) (Oral)   Resp 16   SpO2 100%  Physical Exam Vitals and nursing note reviewed.  Constitutional:      General: He is not in acute distress.    Appearance: Normal appearance. He is not ill-appearing, toxic-appearing or diaphoretic.  HENT:     Head: Normocephalic.     Nose:     Left Nostril: Epistaxis: no active, mild irritation to medial anterior nare.     Right Turbinates: Swollen.     Left Turbinates: Swollen.  Eyes:     Conjunctiva/sclera: Conjunctivae normal.  Cardiovascular:     Rate and Rhythm: Normal rate and regular rhythm.     Pulses: Normal  pulses.  Pulmonary:     Effort: Pulmonary effort is normal. No respiratory distress.  Musculoskeletal:        General: No deformity or signs of injury.     Cervical back: No rigidity.  Skin:    General: Skin is warm and dry.     Coloration: Skin is not jaundiced or pale.  Neurological:     General: No focal deficit present.     Mental Status: He is alert and oriented to person, place, and time.     ED Results / Procedures / Treatments   Labs (all labs ordered are listed, but only abnormal results are displayed) Labs Reviewed - No data to display  EKG None  Radiology No results found.  Procedures Procedures    Medications Ordered in ED Medications  oxymetazoline (AFRIN) 0.05 % nasal spray 1 spray (1 spray Each Nare Given 01/13/23 0733)    ED Course/ Medical Decision Making/ A&P                                    82 year old male with history of COPD, hypertension, depression, BPH, hyperlipidemia, AAA, prior epistaxis, who presents with concern for epistaxis.  No active bleeding on arrival to the ED although suspect anterior epistaxis from left nares.  Has evidence of some mild irritation to kiesselbach's but no clear source of bleeding to identify.  Given afrin and nose clip and discussed epistaxis care.  Recommend continued saline spray as preventative, may follow up prn with ENT given recurrent history.  Patient discharged in stable condition with understanding of reasons to return.         Final Clinical Impression(s) / ED Diagnoses Final diagnoses:  Epistaxis    Rx / DC Orders ED Discharge Orders     None         Alvira Monday, MD 01/13/23 [phone]

## 2023-02-03 DIAGNOSIS — R309 Painful micturition, unspecified: Secondary | ICD-10-CM | POA: Diagnosis not present

## 2023-02-03 DIAGNOSIS — N390 Urinary tract infection, site not specified: Secondary | ICD-10-CM | POA: Diagnosis not present

## 2023-02-03 DIAGNOSIS — I10 Essential (primary) hypertension: Secondary | ICD-10-CM | POA: Diagnosis not present

## 2023-02-03 DIAGNOSIS — N401 Enlarged prostate with lower urinary tract symptoms: Secondary | ICD-10-CM | POA: Diagnosis not present

## 2023-02-03 DIAGNOSIS — E291 Testicular hypofunction: Secondary | ICD-10-CM | POA: Diagnosis not present

## 2023-02-22 DIAGNOSIS — N390 Urinary tract infection, site not specified: Secondary | ICD-10-CM | POA: Diagnosis not present

## 2023-03-02 DIAGNOSIS — N23 Unspecified renal colic: Secondary | ICD-10-CM | POA: Diagnosis not present

## 2023-03-02 DIAGNOSIS — N410 Acute prostatitis: Secondary | ICD-10-CM | POA: Diagnosis not present

## 2023-03-02 DIAGNOSIS — I10 Essential (primary) hypertension: Secondary | ICD-10-CM | POA: Diagnosis not present

## 2023-04-05 DIAGNOSIS — D3131 Benign neoplasm of right choroid: Secondary | ICD-10-CM | POA: Diagnosis not present

## 2023-04-05 DIAGNOSIS — H52203 Unspecified astigmatism, bilateral: Secondary | ICD-10-CM | POA: Diagnosis not present

## 2023-04-05 DIAGNOSIS — H43813 Vitreous degeneration, bilateral: Secondary | ICD-10-CM | POA: Diagnosis not present

## 2023-04-05 DIAGNOSIS — H524 Presbyopia: Secondary | ICD-10-CM | POA: Diagnosis not present

## 2023-04-05 DIAGNOSIS — Z961 Presence of intraocular lens: Secondary | ICD-10-CM | POA: Diagnosis not present

## 2023-04-20 DIAGNOSIS — N401 Enlarged prostate with lower urinary tract symptoms: Secondary | ICD-10-CM | POA: Diagnosis not present

## 2023-04-20 DIAGNOSIS — I2510 Atherosclerotic heart disease of native coronary artery without angina pectoris: Secondary | ICD-10-CM | POA: Diagnosis not present

## 2023-04-20 DIAGNOSIS — E538 Deficiency of other specified B group vitamins: Secondary | ICD-10-CM | POA: Diagnosis not present

## 2023-04-20 DIAGNOSIS — R7301 Impaired fasting glucose: Secondary | ICD-10-CM | POA: Diagnosis not present

## 2023-04-20 DIAGNOSIS — E785 Hyperlipidemia, unspecified: Secondary | ICD-10-CM | POA: Diagnosis not present

## 2023-04-20 DIAGNOSIS — I10 Essential (primary) hypertension: Secondary | ICD-10-CM | POA: Diagnosis not present

## 2023-04-20 DIAGNOSIS — E291 Testicular hypofunction: Secondary | ICD-10-CM | POA: Diagnosis not present

## 2023-04-27 DIAGNOSIS — G3184 Mild cognitive impairment, so stated: Secondary | ICD-10-CM | POA: Diagnosis not present

## 2023-04-27 DIAGNOSIS — I739 Peripheral vascular disease, unspecified: Secondary | ICD-10-CM | POA: Diagnosis not present

## 2023-04-27 DIAGNOSIS — F329 Major depressive disorder, single episode, unspecified: Secondary | ICD-10-CM | POA: Diagnosis not present

## 2023-04-27 DIAGNOSIS — Z0000 Encounter for general adult medical examination without abnormal findings: Secondary | ICD-10-CM | POA: Diagnosis not present

## 2023-04-27 DIAGNOSIS — J449 Chronic obstructive pulmonary disease, unspecified: Secondary | ICD-10-CM | POA: Diagnosis not present

## 2023-04-27 DIAGNOSIS — R82998 Other abnormal findings in urine: Secondary | ICD-10-CM | POA: Diagnosis not present

## 2023-04-27 DIAGNOSIS — I10 Essential (primary) hypertension: Secondary | ICD-10-CM | POA: Diagnosis not present

## 2023-04-27 DIAGNOSIS — I679 Cerebrovascular disease, unspecified: Secondary | ICD-10-CM | POA: Diagnosis not present

## 2023-04-27 DIAGNOSIS — I771 Stricture of artery: Secondary | ICD-10-CM | POA: Diagnosis not present

## 2023-06-14 ENCOUNTER — Other Ambulatory Visit: Payer: Self-pay

## 2023-06-14 DIAGNOSIS — I7143 Infrarenal abdominal aortic aneurysm, without rupture: Secondary | ICD-10-CM

## 2023-06-14 DIAGNOSIS — I739 Peripheral vascular disease, unspecified: Secondary | ICD-10-CM

## 2023-06-14 DIAGNOSIS — I6529 Occlusion and stenosis of unspecified carotid artery: Secondary | ICD-10-CM

## 2023-06-26 ENCOUNTER — Encounter: Payer: Self-pay | Admitting: Surgery

## 2023-06-26 ENCOUNTER — Ambulatory Visit (INDEPENDENT_AMBULATORY_CARE_PROVIDER_SITE_OTHER)
Admission: RE | Admit: 2023-06-26 | Discharge: 2023-06-26 | Disposition: A | Discharge status: Home / Self Care | Payer: Medicare HMO | Source: Ambulatory Visit | Attending: Surgery | Admitting: Surgery

## 2023-06-26 ENCOUNTER — Ambulatory Visit (HOSPITAL_COMMUNITY)
Admission: RE | Admit: 2023-06-26 | Discharge: 2023-06-26 | Disposition: A | Discharge status: Home / Self Care | Payer: Medicare HMO | Source: Ambulatory Visit | Attending: Surgery | Admitting: Surgery

## 2023-06-26 ENCOUNTER — Ambulatory Visit (INDEPENDENT_AMBULATORY_CARE_PROVIDER_SITE_OTHER): Payer: Medicare HMO | Admitting: Surgery

## 2023-06-26 VITALS — BP 146/82 | HR 60 | Temp 97.8°F | Ht 73.0 in | Wt 205.0 lb

## 2023-06-26 DIAGNOSIS — I70211 Atherosclerosis of native arteries of extremities with intermittent claudication, right leg: Secondary | ICD-10-CM | POA: Diagnosis not present

## 2023-06-26 DIAGNOSIS — I739 Peripheral vascular disease, unspecified: Secondary | ICD-10-CM | POA: Diagnosis not present

## 2023-06-26 DIAGNOSIS — I6529 Occlusion and stenosis of unspecified carotid artery: Secondary | ICD-10-CM

## 2023-06-26 DIAGNOSIS — I7143 Infrarenal abdominal aortic aneurysm, without rupture: Secondary | ICD-10-CM

## 2023-06-26 LAB — VAS US ABI WITH/WO TBI
Left ABI: 1.14
Right ABI: 1.19

## 2023-06-26 NOTE — Progress Notes (Signed)
 [facility] [place]  Patient name: Manuel Friedman MRN: [id number] DOB: 1940/11/24 Sex: male   REASON FOR VISIT:    Follow up  HISOTRY OF PRESENT ILLNESS:    Manuel Friedman is a 83 y.o. male who is a former patient of Dr. Edilia Bo.  He is status post right femoral endarterectomy with vein patch angioplasty on 04/06/2017.  This was done for disabling claudication.  He has a history of a small abdominal aortic aneurysm measuring 3.4 cm in 2020.  The patient has a history of coronary artery disease, being followed by Dr. Wyline Mood at cardiology.  He takes a statin for hypercholesterolemia.  He suffers from COPD secondary to former tobacco abuse.  PAST MEDICAL HISTORY:   Past Medical History:  Diagnosis Date   Adenomatous colon polyp    Arthritis    CAD (coronary artery disease)    s/p cath 03/2011 with total RCA, left to right collaterals and mild nonobstructive disease in the LAD and LCX. Trying medical management but could attempt PCI of the RCA   Cancer [facility])    skin cancer   Cataract    Bil/had surgery   COPD (chronic obstructive pulmonary disease) (HCC)    COVID-19 virus infection 11/06/2018   Decreased hearing    wears hearing aid/Bil   Depression    Diverticulosis    Esophageal stricture    Hiatal hernia    HTN (hypertension)    Hyperlipemia    Internal and external hemorrhoids without complication    Lung nodule    Noted on CTA Jan 2013. Needs follow upstudy   Peripheral vascular disease (HCC)    Rosacea    Swelling of ankle, right    Umbilical hernia    hx     FAMILY HISTORY:   Family History  Problem Relation Age of Onset   Pancreatic cancer Mother    Multiple myeloma Father    Colon cancer Neg Hx    Stomach cancer Neg Hx    Esophageal cancer Neg Hx    Rectal cancer Neg Hx     SOCIAL HISTORY:   Social History   Tobacco Use   Smoking status: Former    Current packs/day: 0.00    Types:  Cigarettes    Quit date: 02/29/1992    Years since quitting: 31.3   Smokeless tobacco: Never  Substance Use Topics   Alcohol use: Yes    Alcohol/week: 7.0 standard drinks of alcohol    Types: 7 Standard drinks or equivalent per week     ALLERGIES:   Allergies  Allergen Reactions   Cyclobenzaprine     Other reaction(s): Unknown   Metoprolol     Other reaction(s): not an allergy but energy improved when we stopped it. and mood.     CURRENT MEDICATIONS:   Current Outpatient Medications  Medication Sig Dispense Refill   atorvastatin (LIPITOR) 80 MG tablet Take 80 mg by mouth daily.     cetirizine (ZYRTEC) 10 MG tablet Take 10 mg by mouth as needed for allergies.      cholecalciferol (VITAMIN D3) 25 MCG (1000 UNIT) tablet Take 1,000 Units by mouth daily.     Coenzyme Q10 (COQ-10 PO) Take 1 tablet by mouth daily.     escitalopram (LEXAPRO) 20 MG tablet Take 20 mg by mouth daily.     magnesium oxide (MAG-OX) 400 MG tablet Take 400 mg by mouth daily.     Multiple Vitamin (MULTIVITAMIN WITH MINERALS) TABS tablet  Take 1 tablet by mouth daily.     naproxen sodium (ALEVE) 220 MG tablet Take 220 mg by mouth daily as needed (pain).     pantoprazole (PROTONIX) 20 MG tablet TAKE 1 TABLET EVERY DAY BEFORE BREAKFAST (NEED MD APPOINTMENT) 90 tablet 0   vitamin B-12 (CYANOCOBALAMIN) 500 MCG tablet Take 500 mcg by mouth daily.     zinc gluconate 50 MG tablet Take 50 mg by mouth daily.     Current Facility-Administered Medications  Medication Dose Route Frequency Provider Last Rate Last Admin   0.9 %  sodium chloride infusion  500 mL Intravenous Once Iva Boop, MD        REVIEW OF SYSTEMS:   [X]  denotes positive finding, [ ]  denotes negative finding Cardiac  Comments:  Chest pain or chest pressure:    Shortness of breath upon exertion:    Short of breath when lying flat:    Irregular heart rhythm:        Vascular    Pain in calf, thigh, or hip brought on by ambulation:    Pain  in feet at night that wakes you up from your sleep:     Blood clot in your veins:    Leg swelling:         Pulmonary    Oxygen at home:    Productive cough:     Wheezing:         Neurologic    Sudden weakness in arms or legs:     Sudden numbness in arms or legs:     Sudden onset of difficulty speaking or slurred speech:    Temporary loss of vision in one eye:     Problems with dizziness:         Gastrointestinal    Blood in stool:     Vomited blood:         Genitourinary    Burning when urinating:     Blood in urine:        Psychiatric    Major depression:         Hematologic    Bleeding problems:    Problems with blood clotting too easily:        Skin    Rashes or ulcers:        Constitutional    Fever or chills:      PHYSICAL EXAM:   Vitals:   06/26/23 0859 06/26/23 0902  BP: 111/76 (!) 146/82  Pulse: 60   Temp: 97.8 F (36.6 C)   SpO2: 96%   Weight: 205 lb (93 kg)   Height: 6\' 1"  (1.854 m)     GENERAL: The patient is a well-nourished male, in no acute distress. The vital signs are documented above. CARDIAC: There is a regular rate and rhythm.  VASCULAR: Palpable pedal pulses.  No carotid bruits. PULMONARY: Non-labored respirations ABDOMEN: Soft and non-tender.  No palpable mass MUSCULOSKELETAL: There are no major deformities or cyanosis. NEUROLOGIC: No focal weakness or paresthesias are detected. SKIN: There are no ulcers or rashes noted. PSYCHIATRIC: The patient has a normal affect.  STUDIES:   I have reviewed the following: AAA Abdominal Aorta: There is evidence of abnormal dilatation of the mid  Abdominal aorta. Previous diameter measurement was obtained on 10/06/22:  3.17 x 3.46 cm.     ABI/TBIToday's ABIToday's TBIPrevious ABIPrevious TBI  +-------+-----------+-----------+------------+------------+  Right 1.19       0.76       1.28  0.84          +-------+-----------+-----------+------------+------------+  Left   1.14       0.77       1.27        0.6           +------    Right Carotid: Velocities in the right ICA are consistent with a 1-39%  stenosis.   Left Carotid: Velocities in the left ICA are consistent with a 1-39%  stenosis.   Vertebrals: Bilateral vertebral arteries demonstrate antegrade flow. The  left              vertebral artery flow is atypical.  Subclavians: Normal flow hemodynamics were seen in the right subclavian  artery.              Left subclavian artery flow is monophasic.    MEDICAL ISSUES:   AAA: Maximum diameter is 3.5 cm.  Which is fairly stable.  I will plan on repeating this in 2 years   PAD: Palpable pedal pulses with normal ABIs.  I will get a duplex of his repair and ABIs in 2 years  Carotid: No significant stenosis   I have recommended that he add back at 81 mg aspirin to his medical regimen.    Charlena Cross, MD, FACS [facility] [phone] Pager [phone]

## 2023-07-13 DIAGNOSIS — L821 Other seborrheic keratosis: Secondary | ICD-10-CM | POA: Diagnosis not present

## 2023-07-13 DIAGNOSIS — Z85828 Personal history of other malignant neoplasm of skin: Secondary | ICD-10-CM | POA: Diagnosis not present

## 2023-07-13 DIAGNOSIS — L570 Actinic keratosis: Secondary | ICD-10-CM | POA: Diagnosis not present

## 2023-12-08 DIAGNOSIS — I10 Essential (primary) hypertension: Secondary | ICD-10-CM | POA: Diagnosis not present

## 2023-12-08 DIAGNOSIS — H9190 Unspecified hearing loss, unspecified ear: Secondary | ICD-10-CM | POA: Diagnosis not present

## 2023-12-08 DIAGNOSIS — Z23 Encounter for immunization: Secondary | ICD-10-CM | POA: Diagnosis not present

## 2023-12-08 DIAGNOSIS — E785 Hyperlipidemia, unspecified: Secondary | ICD-10-CM | POA: Diagnosis not present

## 2023-12-08 DIAGNOSIS — I2510 Atherosclerotic heart disease of native coronary artery without angina pectoris: Secondary | ICD-10-CM | POA: Diagnosis not present

## 2023-12-08 DIAGNOSIS — E538 Deficiency of other specified B group vitamins: Secondary | ICD-10-CM | POA: Diagnosis not present

## 2023-12-08 DIAGNOSIS — I679 Cerebrovascular disease, unspecified: Secondary | ICD-10-CM | POA: Diagnosis not present

## 2023-12-08 DIAGNOSIS — I700 Atherosclerosis of aorta: Secondary | ICD-10-CM | POA: Diagnosis not present

## 2023-12-08 DIAGNOSIS — R7301 Impaired fasting glucose: Secondary | ICD-10-CM | POA: Diagnosis not present

## 2023-12-19 DIAGNOSIS — L570 Actinic keratosis: Secondary | ICD-10-CM | POA: Diagnosis not present

## 2023-12-19 DIAGNOSIS — H61002 Unspecified perichondritis of left external ear: Secondary | ICD-10-CM | POA: Diagnosis not present

## 2023-12-19 DIAGNOSIS — Z85828 Personal history of other malignant neoplasm of skin: Secondary | ICD-10-CM | POA: Diagnosis not present

## 2023-12-19 DIAGNOSIS — L821 Other seborrheic keratosis: Secondary | ICD-10-CM | POA: Diagnosis not present

## 2023-12-19 DIAGNOSIS — L905 Scar conditions and fibrosis of skin: Secondary | ICD-10-CM | POA: Diagnosis not present

## 2023-12-19 DIAGNOSIS — D225 Melanocytic nevi of trunk: Secondary | ICD-10-CM | POA: Diagnosis not present

## 2024-04-03 ENCOUNTER — Other Ambulatory Visit: Payer: Self-pay | Admitting: Urology

## 2024-04-07 ENCOUNTER — Encounter: Payer: Self-pay | Admitting: Surgery

## 2024-05-03 ENCOUNTER — Encounter (HOSPITAL_COMMUNITY): Admission: RE | Admit: 2024-05-03

## 2024-05-14 ENCOUNTER — Ambulatory Visit (HOSPITAL_COMMUNITY): Admit: 2024-05-14 | Admitting: Urology

## 2024-05-14 SURGERY — CYSTOSCOPY, WITH BIOPSY
Anesthesia: General
